# Patient Record
Sex: Female | Born: 1985 | Race: White | Hispanic: No | Marital: Single | Smoking: Current every day smoker
Health system: Southern US, Community
[De-identification: ages and names within clinical notes are randomized; demographics above are authoritative.]

## PROBLEM LIST (undated history)

## (undated) DIAGNOSIS — D069 Carcinoma in situ of cervix, unspecified: Secondary | ICD-10-CM

## (undated) DIAGNOSIS — Z915 Personal history of self-harm: Secondary | ICD-10-CM

## (undated) DIAGNOSIS — Z8659 Personal history of other mental and behavioral disorders: Secondary | ICD-10-CM

## (undated) DIAGNOSIS — Z9151 Personal history of suicidal behavior: Secondary | ICD-10-CM

## (undated) DIAGNOSIS — K219 Gastro-esophageal reflux disease without esophagitis: Secondary | ICD-10-CM

## (undated) DIAGNOSIS — R350 Frequency of micturition: Secondary | ICD-10-CM

## (undated) DIAGNOSIS — R519 Headache, unspecified: Secondary | ICD-10-CM

## (undated) DIAGNOSIS — M479 Spondylosis, unspecified: Secondary | ICD-10-CM

## (undated) DIAGNOSIS — F419 Anxiety disorder, unspecified: Secondary | ICD-10-CM

## (undated) DIAGNOSIS — Z87442 Personal history of urinary calculi: Secondary | ICD-10-CM

## (undated) DIAGNOSIS — N390 Urinary tract infection, site not specified: Secondary | ICD-10-CM

## (undated) DIAGNOSIS — F1011 Alcohol abuse, in remission: Secondary | ICD-10-CM

## (undated) DIAGNOSIS — M47816 Spondylosis without myelopathy or radiculopathy, lumbar region: Secondary | ICD-10-CM

---
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
## 2005-02-06 ENCOUNTER — Ambulatory Visit: Payer: Self-pay | Admitting: Family Medicine

## 2005-05-03 ENCOUNTER — Emergency Department (HOSPITAL_COMMUNITY): Admission: EM | Admit: 2005-05-03 | Discharge: 2005-05-03 | Payer: Self-pay | Admitting: Family Medicine

## 2006-04-05 ENCOUNTER — Emergency Department (HOSPITAL_COMMUNITY): Admission: EM | Admit: 2006-04-05 | Discharge: 2006-04-05 | Payer: Self-pay | Admitting: Emergency Medicine

## 2006-10-04 ENCOUNTER — Emergency Department (HOSPITAL_COMMUNITY): Admission: AD | Admit: 2006-10-04 | Discharge: 2006-10-04 | Payer: Self-pay | Admitting: Family Medicine

## 2006-10-06 ENCOUNTER — Encounter (INDEPENDENT_AMBULATORY_CARE_PROVIDER_SITE_OTHER): Payer: Self-pay | Admitting: Internal Medicine

## 2006-10-06 ENCOUNTER — Ambulatory Visit: Payer: Self-pay | Admitting: Family Medicine

## 2006-10-07 ENCOUNTER — Ambulatory Visit: Payer: Self-pay | Admitting: Family Medicine

## 2006-10-12 ENCOUNTER — Encounter (INDEPENDENT_AMBULATORY_CARE_PROVIDER_SITE_OTHER): Payer: Self-pay | Admitting: Internal Medicine

## 2006-10-19 ENCOUNTER — Other Ambulatory Visit: Admission: RE | Admit: 2006-10-19 | Discharge: 2006-10-19 | Payer: Self-pay | Admitting: Gynecology

## 2007-07-20 ENCOUNTER — Emergency Department (HOSPITAL_COMMUNITY): Admission: EM | Admit: 2007-07-20 | Discharge: 2007-07-20 | Payer: Self-pay | Admitting: Emergency Medicine

## 2007-08-26 ENCOUNTER — Emergency Department (HOSPITAL_COMMUNITY): Admission: EM | Admit: 2007-08-26 | Discharge: 2007-08-26 | Payer: Self-pay | Admitting: Emergency Medicine

## 2007-09-07 ENCOUNTER — Emergency Department (HOSPITAL_COMMUNITY): Admission: EM | Admit: 2007-09-07 | Discharge: 2007-09-07 | Payer: Self-pay | Admitting: Emergency Medicine

## 2008-03-21 ENCOUNTER — Ambulatory Visit: Payer: Self-pay | Admitting: Family Medicine

## 2008-03-21 ENCOUNTER — Encounter (INDEPENDENT_AMBULATORY_CARE_PROVIDER_SITE_OTHER): Payer: Self-pay | Admitting: Internal Medicine

## 2008-03-21 DIAGNOSIS — Z87442 Personal history of urinary calculi: Secondary | ICD-10-CM | POA: Insufficient documentation

## 2008-03-21 DIAGNOSIS — N390 Urinary tract infection, site not specified: Secondary | ICD-10-CM

## 2008-03-21 LAB — CONVERTED CEMR LAB
Bacteria, UA: 0
Blood in Urine, dipstick: NEGATIVE
Glucose, Urine, Semiquant: NEGATIVE
Ketones, urine, test strip: NEGATIVE
Protein, U semiquant: 30
WBC Urine, dipstick: NEGATIVE

## 2008-03-22 ENCOUNTER — Encounter (INDEPENDENT_AMBULATORY_CARE_PROVIDER_SITE_OTHER): Payer: Self-pay | Admitting: Internal Medicine

## 2008-03-24 ENCOUNTER — Encounter: Payer: Self-pay | Admitting: Internal Medicine

## 2008-03-28 ENCOUNTER — Encounter (INDEPENDENT_AMBULATORY_CARE_PROVIDER_SITE_OTHER): Payer: Self-pay | Admitting: *Deleted

## 2008-05-19 ENCOUNTER — Ambulatory Visit: Payer: Self-pay | Admitting: Family Medicine

## 2008-05-19 DIAGNOSIS — F17200 Nicotine dependence, unspecified, uncomplicated: Secondary | ICD-10-CM

## 2008-06-26 ENCOUNTER — Encounter (INDEPENDENT_AMBULATORY_CARE_PROVIDER_SITE_OTHER): Payer: Self-pay | Admitting: *Deleted

## 2008-08-16 ENCOUNTER — Encounter (INDEPENDENT_AMBULATORY_CARE_PROVIDER_SITE_OTHER): Payer: Self-pay | Admitting: Internal Medicine

## 2008-08-16 DIAGNOSIS — N200 Calculus of kidney: Secondary | ICD-10-CM | POA: Insufficient documentation

## 2008-08-24 ENCOUNTER — Ambulatory Visit: Payer: Self-pay | Admitting: Family Medicine

## 2008-08-24 DIAGNOSIS — F411 Generalized anxiety disorder: Secondary | ICD-10-CM

## 2008-08-24 DIAGNOSIS — D649 Anemia, unspecified: Secondary | ICD-10-CM

## 2008-08-24 DIAGNOSIS — K219 Gastro-esophageal reflux disease without esophagitis: Secondary | ICD-10-CM

## 2008-08-24 DIAGNOSIS — F1021 Alcohol dependence, in remission: Secondary | ICD-10-CM

## 2008-08-29 LAB — CONVERTED CEMR LAB
BUN: 9 mg/dL (ref 6–23)
Calcium: 9.1 mg/dL (ref 8.4–10.5)
Eosinophils Relative: 1.9 % (ref 0.0–5.0)
GFR calc Af Amer: 115 mL/min
Glucose, Bld: 90 mg/dL (ref 70–99)
HCT: 36.6 % (ref 36.0–46.0)
Hemoglobin: 12.5 g/dL (ref 12.0–15.0)
Monocytes Absolute: 0.8 10*3/uL (ref 0.1–1.0)
Monocytes Relative: 12.7 % — ABNORMAL HIGH (ref 3.0–12.0)
Neutro Abs: 2.9 10*3/uL (ref 1.4–7.7)
WBC: 6.5 10*3/uL (ref 4.5–10.5)

## 2008-09-08 ENCOUNTER — Telehealth (INDEPENDENT_AMBULATORY_CARE_PROVIDER_SITE_OTHER): Payer: Self-pay | Admitting: Internal Medicine

## 2008-09-16 ENCOUNTER — Emergency Department (HOSPITAL_COMMUNITY): Admission: EM | Admit: 2008-09-16 | Discharge: 2008-09-16 | Payer: Self-pay | Admitting: Emergency Medicine

## 2008-10-03 ENCOUNTER — Ambulatory Visit: Payer: Self-pay | Admitting: Family Medicine

## 2008-10-30 ENCOUNTER — Encounter (INDEPENDENT_AMBULATORY_CARE_PROVIDER_SITE_OTHER): Payer: Self-pay | Admitting: Internal Medicine

## 2009-01-30 ENCOUNTER — Ambulatory Visit: Payer: Self-pay | Admitting: Family Medicine

## 2009-01-30 DIAGNOSIS — K2970 Gastritis, unspecified, without bleeding: Secondary | ICD-10-CM | POA: Insufficient documentation

## 2009-01-30 DIAGNOSIS — K2990 Gastroduodenitis, unspecified, without bleeding: Secondary | ICD-10-CM

## 2009-01-31 ENCOUNTER — Telehealth (INDEPENDENT_AMBULATORY_CARE_PROVIDER_SITE_OTHER): Payer: Self-pay | Admitting: Internal Medicine

## 2009-02-26 ENCOUNTER — Telehealth: Payer: Self-pay | Admitting: Family Medicine

## 2009-04-05 ENCOUNTER — Ambulatory Visit: Payer: Self-pay | Admitting: Family Medicine

## 2009-04-05 DIAGNOSIS — J069 Acute upper respiratory infection, unspecified: Secondary | ICD-10-CM | POA: Insufficient documentation

## 2009-04-22 ENCOUNTER — Emergency Department (HOSPITAL_COMMUNITY): Admission: EM | Admit: 2009-04-22 | Discharge: 2009-04-22 | Payer: Self-pay | Admitting: Emergency Medicine

## 2010-02-07 ENCOUNTER — Telehealth: Payer: Self-pay | Admitting: Family Medicine

## 2010-04-01 ENCOUNTER — Telehealth: Payer: Self-pay | Admitting: Family Medicine

## 2010-05-15 ENCOUNTER — Emergency Department (HOSPITAL_COMMUNITY): Admission: EM | Admit: 2010-05-15 | Discharge: 2010-05-15 | Payer: Self-pay | Admitting: Emergency Medicine

## 2010-08-16 ENCOUNTER — Telehealth: Payer: Self-pay | Admitting: Family Medicine

## 2010-11-27 ENCOUNTER — Emergency Department (HOSPITAL_COMMUNITY)
Admission: EM | Admit: 2010-11-27 | Discharge: 2010-11-27 | Payer: Self-pay | Source: Home / Self Care | Admitting: Emergency Medicine

## 2010-12-22 HISTORY — PX: WISDOM TOOTH EXTRACTION: SHX21

## 2011-01-21 NOTE — Progress Notes (Signed)
Summary: requests phenergan  Phone Note Call from Patient Call back at [phone]   Caller: Mom Summary of Call: Mother states pt has been vomiting all night and she is asking if phenergan suppositories can be called to [facility].  No fever or diarrhea.  Mother states that whenever pt starts vomiting she goes on and on with it and they always have to get phenergan. Initial call taken by: Lowella Petties CMA,  April 01, 2010 9:19 AM  Follow-up for Phone Call        since I do not know her - please give me a bit more backround abd pain/ fever/ other symptoms? what causes this recurrent vomiting? - ? viral or other  px written on EMR for call in   Follow-up by: Judith Part MD,  April 01, 2010 11:30 AM  Additional Follow-up for Phone Call Additional follow up Details #1::        No other sxs.  Mother states that pt gets a couple of vomiting bouts every year, usually with no other sxs.  She is followed by Dr. Nicholas Lose for her gyn problems and he is aware of the vomiting.    Additional Follow-up for Phone Call Additional follow up Details #2::    Patient's mom notified as instructed by telephone. Medication phoned to [facility] pharmacy as instructed. Lewanda Rife LPN  April 01, 2010 1:13 PM   New/Updated Medications: PROMETHAZINE HCL 25 MG SUPP (PROMETHAZINE HCL) 1 per rectum up to three times a day as needed vomiting Prescriptions: PROMETHAZINE HCL 25 MG SUPP (PROMETHAZINE HCL) 1 per rectum up to three times a day as needed vomiting  #15 x 0   Entered and Authorized by:   Judith Part MD   Signed by:   Lewanda Rife LPN on 45/40/9811   Method used:   Telephoned to ...       [facility]* (retail)       [address]       [place], [place]  [address]       Ph: [phone]       Fax: [phone]   RxID:   [id number]

## 2011-01-21 NOTE — Letter (Signed)
Summary: [place] No Show Letter  [place] [facility]  [address] [place], [place] [address]   Phone: [phone]  Fax: [phone]    06/26/2008 MRN: [id number]     [facility] [place] [address] [place], [place]  [address]      Dear Ms. Andel,     Our records indicate that you missed your scheduled appointment with Billie Bean,FNP on 06/26/08.  Please contact this office to reschedule your appointment as soon as possible.  It is important that you keep your scheduled appointments with your physician, so we can provide you the best care possible.  Please be advised that there may be a charge for "no show" appointments.    Sincerely,      [place] at [facility]

## 2011-01-21 NOTE — Progress Notes (Signed)
Summary: ? flu  Phone Note Call from Patient   Caller: MomDondra Spry  [phone] Summary of Call: Mother states pt started out with sore throat, congestion about 4 days ago.  Now has vomiting and diarrhea.  Her sister's family have been dx'd with the flu and are taking tamiflu.  Pt has been around them.  Please advise[facility]. Initial call taken by: Lowella Petties CMA,  February 07, 2010 11:47 AM  Follow-up for Phone Call        Taking Tamiflu at this point would not be effective. It has to be given sooner. Now needs symptomatic rtrmt of routine support. Take Guaifenesin by [facility] to [facility] or [facility] and getting MUCOUS RELIEF EXPECTORANT (400mg ), take 11/2 tabs by mouth AM and NOON. Drink lots of fluids anytime taking Guaifenesin.  Take Tyl ES 2 tabs three times a day  Gargle if throaat sore, Lozenge in mouth to preclude "reactive" cough. Follow-up by: Shaune Leeks MD,  February 07, 2010 1:15 PM  Additional Follow-up for Phone Call Additional follow up Details #1::        [facility] for mother to call.             Lowella Petties CMA  February 07, 2010 1:50 PM  Advised mother. Additional Follow-up by: Lowella Petties CMA,  February 07, 2010 2:58 PM

## 2011-01-21 NOTE — Progress Notes (Signed)
Summary: refill request for aciphex  Phone Note Refill Request Message from:  Fax from Pharmacy  Refills Requested: Medication #1:  ACIPHEX 20 MG TBEC 1 each morning before other food or fluids   Last Refilled: 12/12/2008 Faxed request from [facility].  Pt has not been seen since 4/10, no appt scheduled.  Script for aciphex was written on 12/19/08, with 6 refills that is out of date.  Initial call taken by: Lowella Petties CMA,  August 16, 2010 4:28 PM  Follow-up for Phone Call        please call patient and have her schedule appointment.  #30 and no rf on rx.  thanks.  Follow-up by: Crawford Givens MD,  August 16, 2010 4:34 PM  Additional Follow-up for Phone Call Additional follow up Details #1::        Work number is incorrect.  No answer at home and no AM.  Delilah Shan CMA (AAMA)  August 16, 2010 4:48 PM     Prescriptions: ACIPHEX 20 MG TBEC (RABEPRAZOLE SODIUM) 1 each morning before other food or fluids  #30 x 0   Entered and Authorized by:   Crawford Givens MD   Signed by:   Crawford Givens MD on 08/16/2010   Method used:   Electronically to        [facility]* (retail)       [address]       [place], [place]  [address]       Ph: [phone]       Fax: [phone]   RxID:   [id number]

## 2011-03-03 LAB — URINALYSIS, ROUTINE W REFLEX MICROSCOPIC
Bilirubin Urine: NEGATIVE
Nitrite: NEGATIVE
Specific Gravity, Urine: 1.019 (ref 1.005–1.030)
pH: 5.5 (ref 5.0–8.0)

## 2011-03-03 LAB — URINE MICROSCOPIC-ADD ON

## 2011-04-01 LAB — URINE MICROSCOPIC-ADD ON

## 2011-04-01 LAB — DIFFERENTIAL
Basophils Absolute: 0 10*3/uL (ref 0.0–0.1)
Basophils Relative: 0 % (ref 0–1)
Eosinophils Relative: 0 % (ref 0–5)
Monocytes Absolute: 1.2 10*3/uL — ABNORMAL HIGH (ref 0.1–1.0)
Monocytes Relative: 8 % (ref 3–12)

## 2011-04-01 LAB — URINALYSIS, ROUTINE W REFLEX MICROSCOPIC
Bilirubin Urine: NEGATIVE
Nitrite: NEGATIVE
pH: 6 (ref 5.0–8.0)

## 2011-04-01 LAB — CBC
HCT: 41.3 % (ref 36.0–46.0)
Platelets: 287 10*3/uL (ref 150–400)
WBC: 15.3 10*3/uL — ABNORMAL HIGH (ref 4.0–10.5)

## 2011-04-01 LAB — COMPREHENSIVE METABOLIC PANEL
ALT: 40 U/L — ABNORMAL HIGH (ref 0–35)
AST: 35 U/L (ref 0–37)
Albumin: 3.9 g/dL (ref 3.5–5.2)
Alkaline Phosphatase: 50 U/L (ref 39–117)
Chloride: 101 mEq/L (ref 96–112)
GFR calc Af Amer: >60 mL/min (ref >60)
Potassium: 3.5 mEq/L (ref 3.5–5.1)
Sodium: 132 mEq/L — ABNORMAL LOW (ref 135–145)
Total Bilirubin: 0.4 mg/dL (ref 0.3–1.2)

## 2011-04-01 LAB — PREGNANCY, URINE: Preg Test, Ur: NEGATIVE
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
## 2011-05-09 NOTE — H&P (Signed)
NAME:  [name], [name]                       ACCOUNT NO.:  [id number]   MEDICAL RECORD NO.:  [id number]                   PATIENT TYPE:  OBV   LOCATION:  9326                                 F[facility]ILITY:  [facility]   PHYSICIAN:  Gretta Cool, M.D.              DATE OF BIRTH:  [birth year]   DATE OF ADMISSION:  07/22/2002  DATE OF DISCHARGE:  07/23/2002                                HISTORY & PHYSICAL   CHIEF COMPLAINT:  Nausea, vomiting, severe abdominal pain and fever.   HISTORY OF PRESENT ILLNESS:  This is a 25 year old, white, single female,  sexually active since the age of 42, previously culture negative for GC and  Chlamydia.  At last visit, she reported that she was not sexually active.  She has had recurring episodes of cyclic pelvic pain.  She has been seen in  the emergency room on a couple of occasions.  On one occasion, she had CT  scan of her abdomen for suspected appendicitis.  She had urologic  consultation also because of finding of CT with cyst on her kidneys.  She  did not have GYN consultation.  She has episodes of pain that spontaneously  resolve.  She has had cyclic pelvic discomfort occurring approximately mid  cycle and as well as menstrual pain.  She now presents with a history of  sudden onset of severe lower abdominal discomfort with onset at  approximately the end of her menses.  Her last menstrual period began  approximately seven days earlier and just ended the night before her pain  began.  She had onset of pain on July 31, associated with nausea, vomiting  and pain when she walked or took a step.  She also reported high fever with  temperature greater than 102.  She was seen in the office with findings  compatible with an acute abdomen with generalized rebound.  She is now  admitted with diagnosis of probable acute gonococcal PID versus recurrent  pelvic inflammatory disease.  The decision was made to admit her because of  her high fever, elevated white  count and peritoneal signs that are severe  with nausea and vomiting unable to hold down medications as an outpatient.   PAST MEDICAL HISTORY:  1. Usual childhood diseases.  2. No accidents or injuries.   ALLERGIES:  No known drug allergies.   MEDICATIONS:  Nordette oral contraception.   FAMILY HISTORY:  Mother and father in good health.  One sister and one  brother also in good health.  Mother has hypertension.   HABITS:  She smokes.  Denies recreational drugs and alcohol.   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  Mother apparently was not aware that she had been sexually active  in the past.   REVIEW OF SYMPTOMS:  HEENT:  Denies symptoms.  RESPIRATORY:  Denies  shortness of breath.  GI:  Denies frequency, urgency, dysuria, change in  bowel habits.   PHYSICAL EXAMINATION:  GENERAL:  Well-developed, very acutely ill white  female.  When she walks, she is stooped to prevent jarring of her lower  abdomen. She was nauseated during examination with vomiting.  HEENT:  Pupils equal round and reactive to light and accommodation.  Fundi  not examined.  Oropharynx clear.  NECK:  Supple without mass or thyroid enlargement.  CHEST:  Clear to P&A.  HEART:  Regular rhythm without murmur or cardiac enlargement.  BREASTS:  Without mass or nodes.  ABDOMEN:  Direct and rebound tenderness with board-like abdomen and extreme  rebound in all quadrants.  Her abdomen is relatively silent.  PELVIC:  External genitalia, normal female.  Introitus with exquisite  tenderness to cervical motion.  Unable to adequately evaluate her pelvic  organs because of the exquisite tenderness, but there seems to be fullness  bilaterally.  Rectovaginal exam confirms.  EXTREMITIES:  Negative.  NEUROLOGIC:  Physiologic.   IMPRESSION:  1. Probable acute gonococcal pelvic inflammatory disease, rule out chronic     and recurrent pelvic inflammatory disease.  2. History of  recurring lower abdominal pain as above.  3. Early onset of sexual activity without the use of condom to prevent     disease.   PLAN:  1. Admit to [facility] [place]     antibiotic therapy pending culture results.  2. Treat for acute gonococcal pelvic inflammatory disease and Chlamydia as     well as for the possibility this may represent a chronic recurring pelvic     inflammatory process with secondary anaerobic infection.                                               Gretta Cool, M.D.    CWL/MEDQ  D:  07/24/2002  T:  07/29/2002  Job:  [id number]   cc:   Carlean Purl, M.D.

## 2011-07-28 ENCOUNTER — Other Ambulatory Visit: Payer: Self-pay | Admitting: Orthopedic Surgery

## 2011-07-28 DIAGNOSIS — M545 Low back pain: Secondary | ICD-10-CM

## 2011-08-01 ENCOUNTER — Ambulatory Visit
Admission: RE | Admit: 2011-08-01 | Discharge: 2011-08-01 | Disposition: A | Discharge status: Home / Self Care | Payer: 59 | Source: Ambulatory Visit | Attending: Orthopedic Surgery | Admitting: Orthopedic Surgery

## 2011-08-01 DIAGNOSIS — M545 Low back pain: Secondary | ICD-10-CM

## 2011-09-22 LAB — DIFFERENTIAL
Basophils Absolute: 0
Basophils Relative: 0
Eosinophils Relative: 0
Lymphocytes Relative: 24
Neutro Abs: 9.8 — ABNORMAL HIGH

## 2011-09-22 LAB — POCT I-STAT, CHEM 8
Calcium, Ion: 1.13
Chloride: 107
Glucose, Bld: 97
HCT: 43
Hemoglobin: 14.6
TCO2: 21

## 2011-09-22 LAB — POCT URINALYSIS DIP (DEVICE)
Bilirubin Urine: NEGATIVE
Nitrite: NEGATIVE
Urobilinogen, UA: 0.2
pH: 6

## 2011-09-22 LAB — CBC
MCHC: 33.7
Platelets: 283
RDW: 13.5

## 2011-09-22 LAB — POCT PREGNANCY, URINE: Preg Test, Ur: NEGATIVE

## 2012-01-23 HISTORY — PX: CERVICAL BIOPSY  W/ LOOP ELECTRODE EXCISION: SUR135

## 2012-02-20 ENCOUNTER — Encounter (HOSPITAL_COMMUNITY): Payer: Self-pay | Admitting: *Deleted

## 2012-02-20 ENCOUNTER — Emergency Department (HOSPITAL_COMMUNITY)
Admission: EM | Admit: 2012-02-20 | Discharge: 2012-02-21 | Discharge status: Against Medical Advice | Payer: No Typology Code available for payment source | Attending: Emergency Medicine | Admitting: Emergency Medicine

## 2012-02-20 DIAGNOSIS — Z043 Encounter for examination and observation following other accident: Secondary | ICD-10-CM | POA: Insufficient documentation

## 2012-02-20 HISTORY — DX: Spondylosis without myelopathy or radiculopathy, lumbar region: M47.816
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
## 2012-02-20 NOTE — ED Notes (Signed)
Called x1. No answer.

## 2012-02-21 ENCOUNTER — Emergency Department (HOSPITAL_COMMUNITY): Payer: No Typology Code available for payment source

## 2012-02-21 ENCOUNTER — Emergency Department (INDEPENDENT_AMBULATORY_CARE_PROVIDER_SITE_OTHER): Payer: No Typology Code available for payment source

## 2012-02-21 ENCOUNTER — Encounter (HOSPITAL_COMMUNITY): Payer: Self-pay | Admitting: Emergency Medicine

## 2012-02-21 ENCOUNTER — Emergency Department (INDEPENDENT_AMBULATORY_CARE_PROVIDER_SITE_OTHER)
Admission: EM | Admit: 2012-02-21 | Discharge: 2012-02-21 | Disposition: A | Discharge status: Home / Self Care | Payer: Self-pay | Source: Home / Self Care | Attending: Emergency Medicine | Admitting: Emergency Medicine

## 2012-02-21 DIAGNOSIS — S335XXA Sprain of ligaments of lumbar spine, initial encounter: Secondary | ICD-10-CM

## 2012-02-21 DIAGNOSIS — S39012A Strain of muscle, fascia and tendon of lower back, initial encounter: Secondary | ICD-10-CM

## 2012-02-21 DIAGNOSIS — S63509A Unspecified sprain of unspecified wrist, initial encounter: Secondary | ICD-10-CM

## 2012-02-21 LAB — POCT PREGNANCY, URINE: Preg Test, Ur: NEGATIVE

## 2012-02-21 MED ORDER — DICLOFENAC SODIUM 75 MG PO TBEC: 75.0000 mg | DELAYED_RELEASE_TABLET | Freq: Two times a day (BID) | ORAL | Status: DC

## 2012-02-21 MED ORDER — TRAMADOL HCL 50 MG PO TABS: 100.0000 mg | ORAL_TABLET | Freq: Three times a day (TID) | ORAL | Status: DC | PRN

## 2012-02-21 MED ORDER — METHOCARBAMOL 500 MG PO TABS: 500.0000 mg | ORAL_TABLET | Freq: Three times a day (TID) | ORAL | Status: AC

## 2012-02-21 MED ORDER — ACETAMINOPHEN-CODEINE #3 300-30 MG PO TABS: 1.0000 | ORAL_TABLET | ORAL | Status: AC | PRN

## 2012-02-21 MED ORDER — METHOCARBAMOL 500 MG PO TABS: 500.0000 mg | ORAL_TABLET | Freq: Three times a day (TID) | ORAL | Status: DC
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
## 2012-02-21 NOTE — Discharge Instructions (Signed)
Back Exercises Back exercises help treat and prevent back injuries. The goal of back exercises is to increase the strength of your abdominal and back muscles and the flexibility of your back. These exercises should be started when you no longer have back pain. Back exercises include:  Pelvic Tilt. Lie on your back with your knees bent. Tilt your pelvis until the lower part of your back is against the floor. Hold this position 5 to 10 sec and repeat 5 to 10 times.   Knee to Chest. Pull first 1 knee up against your chest and hold for 20 to 30 seconds, repeat this with the other knee, and then both knees. This may be done with the other leg straight or bent, whichever feels better.   Sit-Ups or Curl-Ups. Bend your knees 90 degrees. Start with tilting your pelvis, and do a partial, slow sit-up, lifting your trunk only 30 to 45 degrees off the floor. Take at least 2 to 3 seconds for each sit-up. Do not do sit-ups with your knees out straight. If partial sit-ups are difficult, simply do the above but with only tightening your abdominal muscles and holding it as directed.   Hip-Lift. Lie on your back with your knees flexed 90 degrees. Push down with your feet and shoulders as you raise your hips a couple inches off the floor; hold for 10 seconds, repeat 5 to 10 times.   Back arches. Lie on your stomach, propping yourself up on bent elbows. Slowly press on your hands, causing an arch in your low back. Repeat 3 to 5 times. Any initial stiffness and discomfort should lessen with repetition over time.   Shoulder-Lifts. Lie face down with arms beside your body. Keep hips and torso pressed to floor as you slowly lift your head and shoulders off the floor.  Do not overdo your exercises, especially in the beginning. Exercises may cause you some mild back discomfort which lasts for a few minutes; however, if the pain is more severe, or lasts for more than 15 minutes, do not continue exercises until you see your  caregiver. Improvement with exercise therapy for back problems is slow.  See your caregivers for assistance with developing a proper back exercise program. Document Released: 01/15/2005 Document Revised: 08/06/2011 Document Reviewed: 12/08/2005 [employer] Patient Information 2012 [employer]. 

## 2012-03-13 ENCOUNTER — Encounter (HOSPITAL_COMMUNITY): Payer: Self-pay | Admitting: Emergency Medicine

## 2012-03-13 ENCOUNTER — Emergency Department (INDEPENDENT_AMBULATORY_CARE_PROVIDER_SITE_OTHER)
Admission: EM | Admit: 2012-03-13 | Discharge: 2012-03-13 | Disposition: A | Discharge status: Home / Self Care | Payer: 59 | Source: Home / Self Care | Attending: Emergency Medicine | Admitting: Emergency Medicine

## 2012-03-13 DIAGNOSIS — N200 Calculus of kidney: Secondary | ICD-10-CM

## 2012-03-13 DIAGNOSIS — S63509A Unspecified sprain of unspecified wrist, initial encounter: Secondary | ICD-10-CM

## 2012-03-13 DIAGNOSIS — R319 Hematuria, unspecified: Secondary | ICD-10-CM

## 2012-03-13 DIAGNOSIS — S335XXA Sprain of ligaments of lumbar spine, initial encounter: Secondary | ICD-10-CM

## 2012-03-13 LAB — POCT PREGNANCY, URINE: Preg Test, Ur: NEGATIVE

## 2012-03-13 LAB — POCT URINALYSIS DIP (DEVICE)
Bilirubin Urine: NEGATIVE
Glucose, UA: NEGATIVE mg/dL
Ketones, ur: NEGATIVE mg/dL
Specific Gravity, Urine: 1.015 (ref 1.005–1.030)
Urobilinogen, UA: 0.2 mg/dL (ref 0.0–1.0)

## 2012-03-13 LAB — URINALYSIS, ROUTINE W REFLEX MICROSCOPIC
Glucose, UA: NEGATIVE mg/dL
Protein, ur: 30 mg/dL — AB

## 2012-03-13 LAB — URINE MICROSCOPIC-ADD ON

## 2012-03-13 MED ORDER — ONDANSETRON HCL 4 MG PO TABS: 4.0000 mg | ORAL_TABLET | Freq: Four times a day (QID) | ORAL | Status: AC

## 2012-03-13 MED ORDER — CIPROFLOXACIN HCL 500 MG PO TABS: 500.0000 mg | ORAL_TABLET | Freq: Two times a day (BID) | ORAL | Status: AC

## 2012-03-13 MED ORDER — OXYCODONE-ACETAMINOPHEN 5-325 MG PO TABS: 2.0000 | ORAL_TABLET | ORAL | Status: AC | PRN

## 2012-03-13 MED ORDER — TAMSULOSIN HCL 0.4 MG PO CAPS: 0.4000 mg | ORAL_CAPSULE | Freq: Every day | ORAL | Status: DC

## 2012-03-13 MED ORDER — ONDANSETRON HCL 4 MG PO TABS: 4.0000 mg | ORAL_TABLET | Freq: Four times a day (QID) | ORAL | Status: DC
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
## 2012-03-13 NOTE — Discharge Instructions (Signed)
Give [place] a working phone number so we can contact you if needed. We're sending your urine off for culture, and make sure that you're on the right antibiotic. Increase your fluid intake. You would need to followup with Dr. Laverle Patter, or with Dr. Hillis Range, the urologist on call for reevaluation. Strain all of your urine. Take the medication as written. Return to the ER for worsening symptoms, fever above 100.4, if you're unable to keep any fluids down, or other concerns.

## 2012-03-13 NOTE — ED Provider Notes (Signed)
History     CSN: [id number]  Arrival date & time 03/13/12  1406   First MD Initiated Contact with Patient 03/13/12 1436      Chief Complaint  Patient presents with  . Hematuria  . Flank Pain    (Consider location/radiation/quality/duration/timing/severity/associated sxs/prior treatment) HPI Comments: Patient reports waxing and waning bilateral lower back pain radiating to her groin for the past several days. Reports gross hematuria starting yesterday. Has nausea, no vomiting, no fevers, urinary urgency, frequency, dysuria, cloudy or oderous urine. No back swelling, abdominal distention. No alleviating, aggravating factors. Patient has tried Tylenol #3 without relief. Patient is status post LEEP procedure 2 weeks ago, states vaginal bleeding has since resolved. Patient has a history of recurrent nonobstructing bilateral kidney stones, and states that the pain today is similar to that. Patient has gone to [facility] in the past. Last episode kidney stones in 2009.   Patient is a 26 y.o. female presenting with hematuria. The history is provided by the patient.  Hematuria This is a recurrent problem. The current episode started in the past 7 days. The problem is unchanged. She describes the hematuria as gross hematuria. She reports no clotting in her urine stream. The pain is moderate. She describes her urine color as dark red. Irritative symptoms do not include frequency or urgency. Obstructive symptoms do not include incomplete emptying or straining. Associated symptoms include abdominal pain, flank pain and nausea. Pertinent negatives include no chills, dysuria, facial swelling, fever, genital pain, hesitancy, inability to urinate or vomiting. She is sexually active. Her past medical history is significant for kidney stones and tobacco use.    Past Medical History  Diagnosis Date  . Endometriosis   . Kidney stones   . Kidney infection   . Lumbar facet arthropathy   . Kidney stones     . Cancer     Past Surgical History  Procedure Date  . Cervical cancer     with surgeries  . Hand surgery   . Hand surgery     right hand  . Dental surgery   . Cervical biopsy  w/ loop electrode excision     2 WEEKS AGO    Family History  Problem Relation Age of Onset  . Cancer Mother   . Heart failure Father     History  Substance Use Topics  . Smoking status: Current Everyday Smoker -- 0.5 packs/day  . Smokeless tobacco: Not on file  . Alcohol Use: No     socially    OB History    Grav Para Term Preterm Abortions TAB SAB Ect Mult Living                  Review of Systems  Constitutional: Negative for fever and chills.  HENT: Negative for facial swelling.   Gastrointestinal: Positive for nausea and abdominal pain. Negative for vomiting.  Genitourinary: Positive for hematuria and flank pain. Negative for dysuria, hesitancy, urgency, frequency and incomplete emptying.    Allergies  Review of patient's allergies indicates no known allergies.  Home Medications   Current Outpatient Rx  Name Route Sig Dispense Refill  . CIPROFLOXACIN HCL 500 MG PO TABS Oral Take 1 tablet (500 mg total) by mouth 2 (two) times daily. 14 tablet 0  . ONDANSETRON HCL 4 MG PO TABS Oral Take 1 tablet (4 mg total) by mouth every 6 (six) hours. 20 tablet 0  . OXYCODONE-ACETAMINOPHEN 5-325 MG PO TABS Oral Take 2 tablets by mouth every  4 (four) hours as needed for pain. 15 tablet 0  . PRENATAL PLUS 27-1 MG PO TABS Oral Take 1 tablet by mouth daily.    Marland Kitchen TAMSULOSIN HCL 0.4 MG PO CAPS Oral Take 1 capsule (0.4 mg total) by mouth at bedtime. 14 capsule 0    BP 108/65  Pulse 78  Temp(Src) 98.5 F (36.9 C) (Oral)  Resp 18  SpO2 100%  LMP 03/07/2012  Physical Exam  Nursing note and vitals reviewed. Constitutional: She is oriented to person, place, and time. She appears well-developed and well-nourished. No distress.  HENT:  Head: Normocephalic and atraumatic.  Eyes: Conjunctivae and EOM  are normal.  Neck: Normal range of motion.  Cardiovascular: Normal rate, regular rhythm and normal heart sounds.   Pulmonary/Chest: Effort normal and breath sounds normal.  Abdominal: Soft. Bowel sounds are normal. She exhibits no distension. There is tenderness in the suprapubic area. There is CVA tenderness. There is no rigidity, no rebound and no guarding.       Bilateral flank tenderness, right more than left. Bilateral flank tenderness, tenderness at left UV junction. Mild suprapubic tenderness.  Musculoskeletal: Normal range of motion.  Neurological: She is alert and oriented to person, place, and time.  Skin: Skin is warm and dry.  Psychiatric: She has a normal mood and affect. Her behavior is normal. Judgment and thought content normal.    ED Course  Procedures (including critical care time)  Labs Reviewed  POCT URINALYSIS DIP (DEVICE) - Abnormal; Notable for the following:    Hgb urine dipstick LARGE (*)    Protein, ur 100 (*)    All other components within normal limits  URINALYSIS, ROUTINE W REFLEX MICROSCOPIC - Abnormal; Notable for the following:    Color, Urine RED (*) BIOCHEMICALS MAY BE AFFECTED BY COLOR   APPearance CLOUDY (*)    Hgb urine dipstick LARGE (*)    Protein, ur 30 (*)    Leukocytes, UA SMALL (*)    All other components within normal limits  URINE MICROSCOPIC-ADD ON - Abnormal; Notable for the following:    Squamous Epithelial / LPF FEW (*)    All other components within normal limits  POCT PREGNANCY, URINE  URINE CULTURE   No results found.  1. Hematuria 2. Nephrolithiasis   MDM  Previous records, imaging, labs reviewed. Patient has had several CT abdomen for kidney stones, most recent one in 2009. Showed bilateral small nonobstructing stones. Today patient has tenderness at left UVJ, bilateral CVA tenderness right more than left. No back swelling, facial or lower extremity swelling. Mild bilateral flank tenderness. Do not think that hematuria is  from vaginal bleeding.  Patient is afebrile, appears comfortable, tolerating by mouth, appears well-hydrated.  As patient has had no history of obstructing stones, will treat this as if this is nephrolithiasis and have her followup with her urologist. Sending urine off for microscopy and culture. Sending her home on Cipro, Flomax, Zofran, Percocet. Told her to give [place] a working phone number, so that we can contact her in if we need to change her antibiotics. Patient states she is unable to take NSAIDs and she is status post LEEP, and was instructed by her generated to not take any NSAIDs for 6 weeks.  Luiz Blare, MD 03/13/12 [phone]

## 2012-03-14 LAB — URINE CULTURE
Colony Count: 1000
Culture  Setup Time: 201303240151

## 2012-03-30 ENCOUNTER — Emergency Department (HOSPITAL_COMMUNITY): Payer: 59

## 2012-03-30 ENCOUNTER — Emergency Department (HOSPITAL_COMMUNITY)
Admission: EM | Admit: 2012-03-30 | Discharge: 2012-03-30 | Disposition: A | Discharge status: Home / Self Care | Payer: 59 | Attending: Emergency Medicine | Admitting: Emergency Medicine

## 2012-03-30 ENCOUNTER — Encounter (HOSPITAL_COMMUNITY): Payer: Self-pay | Admitting: Emergency Medicine

## 2012-03-30 DIAGNOSIS — R61 Generalized hyperhidrosis: Secondary | ICD-10-CM | POA: Insufficient documentation

## 2012-03-30 DIAGNOSIS — N200 Calculus of kidney: Secondary | ICD-10-CM

## 2012-03-30 DIAGNOSIS — M549 Dorsalgia, unspecified: Secondary | ICD-10-CM | POA: Insufficient documentation

## 2012-03-30 DIAGNOSIS — R112 Nausea with vomiting, unspecified: Secondary | ICD-10-CM | POA: Insufficient documentation

## 2012-03-30 DIAGNOSIS — R109 Unspecified abdominal pain: Secondary | ICD-10-CM | POA: Insufficient documentation

## 2012-03-30 DIAGNOSIS — N1330 Unspecified hydronephrosis: Secondary | ICD-10-CM | POA: Insufficient documentation

## 2012-03-30 DIAGNOSIS — N201 Calculus of ureter: Secondary | ICD-10-CM | POA: Insufficient documentation

## 2012-03-30 LAB — URINALYSIS, ROUTINE W REFLEX MICROSCOPIC
Ketones, ur: NEGATIVE mg/dL
Leukocytes, UA: NEGATIVE
Nitrite: NEGATIVE
Protein, ur: NEGATIVE mg/dL
Urobilinogen, UA: 0.2 mg/dL (ref 0.0–1.0)
pH: 6.5 (ref 5.0–8.0)

## 2012-03-30 LAB — CBC
HCT: 36.7 % (ref 36.0–46.0)
Hemoglobin: 12.1 g/dL (ref 12.0–15.0)
MCH: 32 pg (ref 26.0–34.0)
MCHC: 33 g/dL (ref 30.0–36.0)
MCV: 97.1 fL (ref 78.0–100.0)
RDW: 13.3 % (ref 11.5–15.5)

## 2012-03-30 LAB — POCT I-STAT, CHEM 8
BUN: 11 mg/dL (ref 6–23)
Chloride: 106 mEq/L (ref 96–112)
Creatinine, Ser: 0.7 mg/dL (ref 0.50–1.10)
Potassium: 3.3 mEq/L — ABNORMAL LOW (ref 3.5–5.1)
Sodium: 142 mEq/L (ref 135–145)
TCO2: 24 mmol/L (ref 0–100)

## 2012-03-30 LAB — DIFFERENTIAL
Basophils Absolute: 0.1 10*3/uL (ref 0.0–0.1)
Basophils Relative: 1 % (ref 0–1)
Eosinophils Absolute: 0.3 10*3/uL (ref 0.0–0.7)
Eosinophils Relative: 2 % (ref 0–5)
Monocytes Absolute: 0.9 10*3/uL (ref 0.1–1.0)
Monocytes Relative: 7 % (ref 3–12)

## 2012-03-30 LAB — URINE MICROSCOPIC-ADD ON

## 2012-03-30 MED ORDER — HYDROCODONE-ACETAMINOPHEN 5-500 MG PO TABS: 1.0000 | ORAL_TABLET | Freq: Four times a day (QID) | ORAL | Status: DC | PRN

## 2012-03-30 MED ORDER — KETOROLAC TROMETHAMINE 30 MG/ML IJ SOLN
30.0000 mg | Freq: Once | INTRAMUSCULAR | Status: AC
  Administered 2012-03-30: 30 mg via INTRAVENOUS
  Filled 2012-03-30: qty 1

## 2012-03-30 MED ORDER — ONDANSETRON 8 MG PO TBDP: 8.0000 mg | ORAL_TABLET | Freq: Three times a day (TID) | ORAL | Status: DC | PRN

## 2012-03-30 MED ORDER — CIPROFLOXACIN HCL 500 MG PO TABS: 500.0000 mg | ORAL_TABLET | Freq: Two times a day (BID) | ORAL | Status: DC

## 2012-03-30 MED ORDER — HYDROMORPHONE HCL PF 1 MG/ML IJ SOLN
1.0000 mg | Freq: Once | INTRAMUSCULAR | Status: AC
  Administered 2012-03-30: 1 mg via INTRAVENOUS
  Filled 2012-03-30: qty 1

## 2012-03-30 MED ORDER — HYDROCODONE-ACETAMINOPHEN 5-325 MG PO TABS
1.0000 | ORAL_TABLET | Freq: Once | ORAL | Status: AC
  Administered 2012-03-30: 1 via ORAL
  Filled 2012-03-30: qty 1

## 2012-03-30 MED ORDER — ONDANSETRON HCL 4 MG/2ML IJ SOLN
4.0000 mg | Freq: Once | INTRAMUSCULAR | Status: AC
  Administered 2012-03-30: 4 mg via INTRAVENOUS
  Filled 2012-03-30: qty 2

## 2012-03-30 MED ORDER — IBUPROFEN 600 MG PO TABS: 600.0000 mg | ORAL_TABLET | Freq: Four times a day (QID) | ORAL | Status: AC | PRN

## 2012-03-30 MED ORDER — OXYCODONE-ACETAMINOPHEN 5-325 MG PO TABS
1.0000 | ORAL_TABLET | Freq: Once | ORAL | Status: DC
  Filled 2012-03-30: qty 1

## 2012-03-30 NOTE — ED Notes (Signed)
UA sent to lab

## 2012-03-30 NOTE — ED Notes (Signed)
Pt is c/o right flank pain that started this morning  Pt has hx of kidney stones

## 2012-03-30 NOTE — Discharge Instructions (Signed)
You have a 4mm kidney stone in right ureter that is causing your pain. Continue ibuprofen, vicodin at home for pain. Take zofran for nausea as prescribed. Take cipro for possible infection as prescribed. Follow up with urology as scheduled tomorrow.  Return if worsening.   Kidney Stones Kidney stones (ureteral lithiasis) are deposits that form inside your kidneys. The intense pain is caused by the stone moving through the urinary tract. When the stone moves, the ureter goes into spasm around the stone. The stone is usually passed in the urine.  CAUSES   A disorder that makes certain neck glands produce too much parathyroid hormone (primary hyperparathyroidism).   A buildup of uric acid crystals.   Narrowing (stricture) of the ureter.   A kidney obstruction present at birth (congenital obstruction).   Previous surgery on the kidney or ureters.   Numerous kidney infections.  SYMPTOMS   Feeling sick to your stomach (nauseous).   Throwing up (vomiting).   Blood in the urine (hematuria).   Pain that usually spreads (radiates) to the groin.   Frequency or urgency of urination.  DIAGNOSIS   Taking a history and physical exam.   Blood or urine tests.   Computerized X-ray scan (CT scan).   Occasionally, an examination of the inside of the urinary bladder (cystoscopy) is performed.  TREATMENT   Observation.   Increasing your fluid intake.   Surgery may be needed if you have severe pain or persistent obstruction.  The size, location, and chemical composition are all important variables that will determine the proper choice of action for you. Talk to your caregiver to better understand your situation so that you will minimize the risk of injury to yourself and your kidney.  HOME CARE INSTRUCTIONS   Drink enough water and fluids to keep your urine clear or pale yellow.   Strain all urine through the provided strainer. Keep all particulate matter and stones for your caregiver to  see. The stone causing the pain may be as small as a grain of salt. It is very important to use the strainer each and every time you pass your urine. The collection of your stone will allow your caregiver to analyze it and verify that a stone has actually passed.   Only take over-the-counter or prescription medicines for pain, discomfort, or fever as directed by your caregiver.   Make a follow-up appointment with your caregiver as directed.   Get follow-up X-rays if required. The absence of pain does not always mean that the stone has passed. It may have only stopped moving. If the urine remains completely obstructed, it can cause loss of kidney function or even complete destruction of the kidney. It is your responsibility to make sure X-rays and follow-ups are completed. Ultrasounds of the kidney can show blockages and the status of the kidney. Ultrasounds are not associated with any radiation and can be performed easily in a matter of minutes.  SEEK IMMEDIATE MEDICAL CARE IF:   Pain cannot be controlled with the prescribed medicine.   You have a fever.   The severity or intensity of pain increases over 18 hours and is not relieved by pain medicine.   You develop a new onset of abdominal pain.   You feel faint or pass out.  MAKE SURE YOU:   Understand these instructions.   Will watch your condition.   Will get help right away if you are not doing well or get worse.  Document Released: 12/08/2005 Document Revised: 11/27/2011  Document Reviewed: 04/05/2010 [facility] Patient Information 2012 [place], [place].

## 2012-03-30 NOTE — ED Notes (Signed)
Pt. requested for something to drink. RN Lillia Abed approved and gave gingerale.

## 2012-03-30 NOTE — ED Notes (Signed)
Pt's family at desk to inquire about seeing MD; RN notified Dr. Norlene Campbell of request.

## 2012-03-30 NOTE — ED Provider Notes (Signed)
Medical screening examination/treatment/procedure(s) were performed by non-physician practitioner and as supervising physician I was immediately available for consultation/collaboration.  Olivia Mackie, MD 03/30/12 2136

## 2012-03-30 NOTE — ED Provider Notes (Signed)
History     CSN: [id number]  Arrival date & time 03/30/12  5621   First MD Initiated Contact with Patient 03/30/12 [phone]      Chief Complaint  Patient presents with  . Flank Pain    (Consider location/radiation/quality/duration/timing/severity/associated sxs/prior treatment) Patient is a 26 y.o. female presenting with flank pain. The history is provided by the patient.  Flank Pain This is a new problem. The problem occurs constantly. Associated symptoms include abdominal pain, nausea and vomiting. Pertinent negatives include no chills or fever.  Pt states she has had right flank pain for 2 days now. States hx of kindey stones. Denies dysuria, denies hematuria. States pain radiates into right lower abdomen, admits to nausea, vomiting. Denies fever chills. Has appointment with urologist for hematuria that she had few weeks ago. Denies taking any medications prior to arrival.  Past Medical History  Diagnosis Date  . Endometriosis   . Kidney stones   . Kidney infection   . Lumbar facet arthropathy   . Kidney stones   . Cancer     Past Surgical History  Procedure Date  . Cervical cancer     with surgeries  . Hand surgery   . Hand surgery     right hand  . Dental surgery   . Cervical biopsy  w/ loop electrode excision     2 WEEKS AGO    Family History  Problem Relation Age of Onset  . Cancer Mother   . Heart failure Father     History  Substance Use Topics  . Smoking status: Current Everyday Smoker -- 0.5 packs/day    Types: Cigarettes  . Smokeless tobacco: Not on file  . Alcohol Use: Yes     socially    OB History    Grav Para Term Preterm Abortions TAB SAB Ect Mult Living                  Review of Systems  Constitutional: Negative for fever and chills.  Gastrointestinal: Positive for nausea, vomiting and abdominal pain. Negative for diarrhea.  Genitourinary: Positive for flank pain. Negative for dysuria, frequency and hematuria.  Musculoskeletal: Positive  for back pain.  All other systems reviewed and are negative.    Allergies  Review of patient's allergies indicates no known allergies.  Home Medications   Current Outpatient Rx  Name Route Sig Dispense Refill  . ACETAMINOPHEN-CODEINE #3 300-30 MG PO TABS Oral Take 1 tablet by mouth every 4 (four) hours as needed. Pain    . ALPRAZOLAM 0.5 MG PO TABS Oral Take 0.5 mg by mouth at bedtime as needed. Anxiety    . PRENATAL PLUS 27-1 MG PO TABS Oral Take 1 tablet by mouth daily.    Marland Kitchen TAMSULOSIN HCL 0.4 MG PO CAPS Oral Take 1 capsule (0.4 mg total) by mouth at bedtime. 14 capsule 0    BP 112/64  Pulse 66  Temp(Src) 97.5 F (36.4 C) (Oral)  Resp 18  Ht 5\' 1"  (1.549 m)  Wt 96 lb (43.545 kg)  BMI 18.14 kg/m2  SpO2 100%  LMP 03/26/2012  Physical Exam  Nursing note and vitals reviewed. Constitutional: She is oriented to person, place, and time. She appears well-developed and well-nourished.       Pt is diaphoretic, appears in pain, crying  HENT:  Head: Normocephalic.  Eyes: Conjunctivae are normal.  Neck: Neck supple.  Cardiovascular: Normal rate, regular rhythm and normal heart sounds.   Pulmonary/Chest: Effort normal and breath sounds  normal. No respiratory distress.  Abdominal: Soft. Bowel sounds are normal.       Right CVA tenderness. RLQ tenderness, no guarding, no rebound tenderness.  Musculoskeletal: Normal range of motion.  Neurological: She is alert and oriented to person, place, and time.  Skin: Skin is warm and dry.  Psychiatric: She has a normal mood and affect.    ED Course  Procedures (including critical care time)  Labs Reviewed  URINALYSIS, ROUTINE W REFLEX MICROSCOPIC - Abnormal; Notable for the following:    APPearance CLOUDY (*)    Hgb urine dipstick LARGE (*)    All other components within normal limits  URINE MICROSCOPIC-ADD ON - Abnormal; Notable for the following:    Squamous Epithelial / LPF FEW (*)    Bacteria, UA MANY (*)    All other components  within normal limits  CBC - Abnormal; Notable for the following:    WBC 14.1 (*)    RBC 3.78 (*)    All other components within normal limits  DIFFERENTIAL - Abnormal; Notable for the following:    Neutro Abs 8.7 (*)    All other components within normal limits  POCT I-STAT, CHEM 8 - Abnormal; Notable for the following:    Potassium 3.3 (*)    Glucose, Bld 126 (*)    All other components within normal limits  POCT PREGNANCY, URINE  URINE CULTURE   Ct Abdomen Pelvis Wo Contrast  03/30/2012  *RADIOLOGY REPORT*  Clinical Data: Right flank pain; history of endometriosis and renal stones.  Red blood cells and white blood cells in the urine.  CT ABDOMEN AND PELVIS WITHOUT CONTRAST  Technique:  Multidetector CT imaging of the abdomen and pelvis was performed following the standard protocol without intravenous contrast.  Comparison: CT urogram performed 03/22/2008  Findings: The visualized lung bases are clear.  The liver and spleen are unremarkable in appearance.  The gallbladder is within normal limits.  The pancreas and adrenal glands are unremarkable.  There is mild right-sided hydronephrosis, with prominence of the proximal ureter to the level of an obstructing 4 mm stone, approximately 3 cm below the right renal pelvis.  This is difficult to characterize due to developmentally unusual configuration of the right kidney.  Scattered small nonobstructing bilateral renal stones are seen. The left kidney is otherwise unremarkable.  No free fluid is identified.  The small bowel is unremarkable in appearance.  The stomach is within normal limits.  No acute vascular abnormalities are seen.  The appendix is normal in caliber and contains air, without evidence for appendicitis.  The colon is grossly unremarkable in appearance.  The bladder is mildly distended and grossly unremarkable.  The uterus is within normal limits.  The ovaries are relatively symmetric; no suspicious adnexal masses are seen.  No inguinal  lymphadenopathy is seen.  No acute osseous abnormalities are identified.  IMPRESSION:  1.  Mild right-sided hydronephrosis, with an obstructing 4 mm stone noted in the proximal right ureter, approximately 3 cm below the right renal pelvis. 2.  Scattered small nonobstructing bilateral renal stones seen.  Original Report Authenticated By: Tonia Ghent, M.D.   CT resulted with right 4mm calculus with right sided hydronephrosis. Appendix normal. Suspect this is what causes pts symptoms. Pt now received pain medications. She is feeling much better. She is tolerating PO fluids.   1. Kidney stone on right side       MDM  Pt with right flank pain, 4mm stone in right proxima ureter per CT  abd/pelv. Pt is non toxic afebrile, although ua back with few bacteria. Will cover with antibiotics. Pt's pain is under cotnrol. She is tolerating PO fluids. She has appointment for follow up tomorrow with urology.         Lottie Mussel, PA 03/30/12 1526

## 2012-03-31 ENCOUNTER — Ambulatory Visit (HOSPITAL_COMMUNITY)
Admission: EM | Admit: 2012-03-31 | Discharge: 2012-03-31 | Disposition: A | Discharge status: Home / Self Care | Payer: 59 | Source: Ambulatory Visit | Attending: Urology | Admitting: Urology

## 2012-03-31 ENCOUNTER — Other Ambulatory Visit: Payer: Self-pay | Admitting: Urology

## 2012-03-31 ENCOUNTER — Encounter (HOSPITAL_COMMUNITY)
Admission: EM | Disposition: A | Discharge status: Home / Self Care | Payer: Self-pay | Source: Ambulatory Visit | Attending: Urology

## 2012-03-31 ENCOUNTER — Encounter (HOSPITAL_COMMUNITY): Payer: Self-pay | Admitting: Anesthesiology

## 2012-03-31 ENCOUNTER — Ambulatory Visit (HOSPITAL_COMMUNITY): Payer: 59 | Admitting: Anesthesiology

## 2012-03-31 DIAGNOSIS — N1330 Unspecified hydronephrosis: Secondary | ICD-10-CM | POA: Insufficient documentation

## 2012-03-31 DIAGNOSIS — N201 Calculus of ureter: Secondary | ICD-10-CM | POA: Insufficient documentation

## 2012-03-31 DIAGNOSIS — Z79899 Other long term (current) drug therapy: Secondary | ICD-10-CM | POA: Insufficient documentation

## 2012-03-31 LAB — URINE CULTURE: Culture  Setup Time: 201304090846

## 2012-03-31 SURGERY — CYSTOURETEROSCOPY, WITH RETROGRADE PYELOGRAM AND STENT INSERTION
Anesthesia: General | Site: Ureter | Laterality: Right | Wound class: Clean Contaminated

## 2012-03-31 MED ORDER — FENTANYL CITRATE 0.05 MG/ML IJ SOLN: 25.0000 ug | INTRAMUSCULAR | Status: DC | PRN

## 2012-03-31 MED ORDER — CEFAZOLIN SODIUM 1-5 GM-% IV SOLN
1.0000 g | INTRAVENOUS | Status: AC
  Administered 2012-03-31: 1 g via INTRAVENOUS

## 2012-03-31 MED ORDER — ONDANSETRON HCL 4 MG/2ML IJ SOLN
INTRAMUSCULAR | Status: DC | PRN
  Administered 2012-03-31: 4 mg via INTRAVENOUS

## 2012-03-31 MED ORDER — STERILE WATER FOR IRRIGATION IR SOLN
Status: DC | PRN
  Administered 2012-03-31: 3000 mL

## 2012-03-31 MED ORDER — PROMETHAZINE HCL 12.5 MG PO TABS: 12.5000 mg | ORAL_TABLET | Freq: Four times a day (QID) | ORAL | Status: AC | PRN

## 2012-03-31 MED ORDER — CEFAZOLIN SODIUM 1-5 GM-% IV SOLN
INTRAVENOUS | Status: AC
  Filled 2012-03-31: qty 50

## 2012-03-31 MED ORDER — PHENAZOPYRIDINE HCL 200 MG PO TABS: 200.0000 mg | ORAL_TABLET | Freq: Three times a day (TID) | ORAL | Status: AC | PRN

## 2012-03-31 MED ORDER — DEXAMETHASONE SODIUM PHOSPHATE 10 MG/ML IJ SOLN
INTRAMUSCULAR | Status: DC | PRN
  Administered 2012-03-31: 10 mg via INTRAVENOUS

## 2012-03-31 MED ORDER — LACTATED RINGERS IV SOLN: INTRAVENOUS | Status: DC

## 2012-03-31 MED ORDER — IOHEXOL 300 MG/ML  SOLN
INTRAMUSCULAR | Status: DC | PRN
  Administered 2012-03-31: 8 mL

## 2012-03-31 MED ORDER — IOHEXOL 300 MG/ML  SOLN
INTRAMUSCULAR | Status: AC
  Filled 2012-03-31: qty 1

## 2012-03-31 MED ORDER — FENTANYL CITRATE 0.05 MG/ML IJ SOLN
INTRAMUSCULAR | Status: DC | PRN
  Administered 2012-03-31 (×2): 25 ug via INTRAVENOUS

## 2012-03-31 MED ORDER — LIDOCAINE HCL (CARDIAC) 20 MG/ML IV SOLN
INTRAVENOUS | Status: DC | PRN
  Administered 2012-03-31: 50 mg via INTRAVENOUS

## 2012-03-31 MED ORDER — PROMETHAZINE HCL 25 MG/ML IJ SOLN: 6.2500 mg | INTRAMUSCULAR | Status: DC | PRN

## 2012-03-31 MED ORDER — SODIUM CHLORIDE 0.9 % IV SOLN
INTRAVENOUS | Status: DC
  Administered 2012-03-31: 1000 mL via INTRAVENOUS

## 2012-03-31 MED ORDER — PHENAZOPYRIDINE HCL 200 MG PO TABS
ORAL_TABLET | ORAL | Status: AC
  Administered 2012-03-31: 200 mg via OROMUCOSAL
  Filled 2012-03-31: qty 1

## 2012-03-31 MED ORDER — OXYCODONE-ACETAMINOPHEN 5-325 MG PO TABS: 1.0000 | ORAL_TABLET | ORAL | Status: AC | PRN

## 2012-03-31 MED ORDER — OXYCODONE-ACETAMINOPHEN 5-325 MG PO TABS
ORAL_TABLET | ORAL | Status: AC
  Administered 2012-03-31: 2 via OROMUCOSAL
  Filled 2012-03-31: qty 2

## 2012-03-31 MED ORDER — ONDANSETRON HCL 4 MG/2ML IJ SOLN: 4.0000 mg | Freq: Four times a day (QID) | INTRAMUSCULAR | Status: DC | PRN

## 2012-03-31 MED ORDER — PHENAZOPYRIDINE HCL 200 MG PO TABS
ORAL_TABLET | ORAL | Status: AC
  Filled 2012-03-31: qty 1

## 2012-03-31 MED ORDER — LACTATED RINGERS IV SOLN
INTRAVENOUS | Status: DC
  Administered 2012-03-31: 1000 mL via INTRAVENOUS

## 2012-03-31 MED ORDER — MORPHINE SULFATE 2 MG/ML IJ SOLN: 2.0000 mg | INTRAMUSCULAR | Status: DC | PRN

## 2012-03-31 MED ORDER — LACTATED RINGERS IV SOLN
INTRAVENOUS | Status: DC | PRN
  Administered 2012-03-31: 17:00:00 via INTRAVENOUS

## 2012-03-31 MED ORDER — BELLADONNA ALKALOIDS-OPIUM 16.2-60 MG RE SUPP
RECTAL | Status: AC
  Filled 2012-03-31: qty 1

## 2012-03-31 MED ORDER — MEPERIDINE HCL 50 MG/ML IJ SOLN: 6.2500 mg | INTRAMUSCULAR | Status: DC | PRN

## 2012-03-31 MED ORDER — SODIUM CHLORIDE 0.9 % IR SOLN
Status: DC | PRN
  Administered 2012-03-31: 1000 mL

## 2012-03-31 MED ORDER — PROPOFOL 10 MG/ML IV BOLUS
INTRAVENOUS | Status: DC | PRN
  Administered 2012-03-31: 150 mg via INTRAVENOUS

## 2012-03-31 SURGICAL SUPPLY — 18 items
BAG URO CATCHER STRL LF (DRAPE) ×3 IMPLANT
CATH INTERMIT  6FR 70CM (CATHETERS) ×2 IMPLANT
CATH URET 5FR 28IN OPEN ENDED (CATHETERS) ×1 IMPLANT
CLOTH BEACON ORANGE TIMEOUT ST (SAFETY) ×3 IMPLANT
DRAPE CAMERA CLOSED 9X96 (DRAPES) ×3 IMPLANT
FLEXI-TIP DUAL LUMEN URETERAL ACCESS CATHETER ×2 IMPLANT
GLOVE SURG SS PI 8.0 STRL IVOR (GLOVE) ×3 IMPLANT
GOWN PREVENTION PLUS XLARGE (GOWN DISPOSABLE) ×3 IMPLANT
GOWN STRL REIN XL XLG (GOWN DISPOSABLE) ×3 IMPLANT
GUIDEWIRE STR DUAL SENSOR (WIRE) ×2 IMPLANT
IV NS 1000ML (IV SOLUTION) ×3
IV NS 1000ML BAXH (IV SOLUTION) ×1 IMPLANT
MANIFOLD NEPTUNE II (INSTRUMENTS) ×3 IMPLANT
MARKER SKIN DUAL TIP RULER LAB (MISCELLANEOUS) ×3 IMPLANT
PACK CYSTO (CUSTOM PROCEDURE TRAY) ×3 IMPLANT
SHEATH ACCESS URETERAL 24CM (SHEATH) ×2 IMPLANT
TUBING CONNECTING 10 (TUBING) ×3 IMPLANT
WATER STERILE IRR 3000ML UROMA (IV SOLUTION) ×2 IMPLANT

## 2012-03-31 NOTE — Transfer of Care (Signed)
Immediate Anesthesia Transfer of Care Note  Patient: [name]  Procedure(s) Performed: Procedure(s) (LRB): CYSTOSCOPY/RETROGRADE/URETEROSCOPY/STONE EXTRACTION WITH BASKET (Right) HOLMIUM LASER APPLICATION (Right)  Patient Location: PACU  Anesthesia Type: General  Level of Consciousness: sedated  Airway & Oxygen Therapy: Patient Spontanous Breathing and Patient connected to face mask oxygen  Post-op Assessment: Report given to PACU RN and Post -op Vital signs reviewed and stable  Post vital signs: Reviewed and stable  Complications: No apparent anesthesia complications

## 2012-03-31 NOTE — Anesthesia Preprocedure Evaluation (Signed)
Anesthesia Evaluation  Patient identified by MRN, date of birth, ID band Patient awake    Reviewed: Allergy & Precautions, H&P , NPO status , Patient's Chart, lab work & pertinent test results  Airway Mallampati: II TM Distance: >3 FB Neck ROM: full    Dental No notable dental hx.    Pulmonary neg pulmonary ROS, Current Smoker,  breath sounds clear to auscultation  Pulmonary exam normal       Cardiovascular Exercise Tolerance: Good negative cardio ROS  Rhythm:regular Rate:Normal     Neuro/Psych negative neurological ROS  negative psych ROS   GI/Hepatic negative GI ROS, Neg liver ROS,   Endo/Other  negative endocrine ROS  Renal/GU negative Renal ROS  negative genitourinary   Musculoskeletal   Abdominal   Peds  Hematology negative hematology ROS (+)   Anesthesia Other Findings   Reproductive/Obstetrics negative OB ROS                           Anesthesia Physical Anesthesia Plan  ASA: II  Anesthesia Plan: General   Post-op Pain Management:    Induction:   Airway Management Planned:   Additional Equipment:   Intra-op Plan:   Post-operative Plan:   Informed Consent: I have reviewed the patients History and Physical, chart, labs and discussed the procedure including the risks, benefits and alternatives for the proposed anesthesia with the patient or authorized representative who has indicated his/her understanding and acceptance.   Dental Advisory Given  Plan Discussed with: CRNA  Anesthesia Plan Comments:         Anesthesia Quick Evaluation
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
## 2012-03-31 NOTE — Discharge Instructions (Signed)
Ureteral Stent A ureteral stent is a soft plastic tube with multiple holes. The stent is inserted into a ureter to help drain urine from the kidney into the bladder. The tube has a coil on each end to keep it from falling out. One end stays in the kidney. The other end stays in the bladder. A stent cannot be seen from the outside. Usually it does not keep you from going about normal routines. A ureteral stent is used to bypass a blockage in your kidney or ureter. This blockage can be caused by kidney stones, scar tissue, pregnancy, or other causes. It can also be used during treatment to remove a kidney stone or to let a ureter heal after surgery. The stent allows urine to drain from the kidney into the bladder. It is most often taken out after the blockage has been removed or the ureter has healed. If a stent is needed for a long time, it will be changed every few months. INSERTING THE STENT Your stent is put in by a urologist. This is a medical doctor trained for treating genitourinary (kidney, ureter and bladder) problems. Before your stent is put in, your caregiver may order x-rays or other imaging tests of your kidneys and ureters. The stent is inserted in a hospital or same day surgical center. You can anticipate going home the same day. PROCEDURE  A special x-ray machine called a fluoroscope is used to guide the insertion of your stent. This allows your doctor to make sure the stent is in the correct place.   First you are given anesthesia to keep you comfortable.   Then your doctor inserts a special lighted instrument called a cystoscope into your bladder. This allows your doctor to see the opening to the ureter.   A thin wire is carefully threaded into the bladder and up the ureter. The stent is inserted over the wire and the wire is then removed.  HOME CARE INSTRUCTIONS   While the stent is in place, you may feel some discomfort. Certain movements may trigger pain or a feeling that you need  to urinate. Your caregiver may give you pain medication. Only take over-the-counter or prescription medicines for pain, discomfort, or fever as directed by your caregiver. Do not take aspirin as this can make bleeding worse.   You may be given medications to prevent infection or bladder spasms. Be sure to take all medications as directed.   Drink plenty of fluids.   You may have small amounts of bleeding causing your urine to be slightly red. This is nothing to be concerned about.  REMOVAL OF THE STENT Your stent is left in until the blockage is resolved/stone removed. Before the stent is removed, you may have an x-ray make sure the ureter is open. The stent can be removed by your caregiver in the office. Medications may be given for comfort. BE SURE TO KEEP FOLLOW-UP APPOINTMENTS, so your caregiver can PLAN TO REMOVE THE STONE AND STENT.  SEEK IMMEDIATE MEDICAL CARE IF:   Your urine is dark red or has blood clots.   You are incontinent (leaking urine).   You have an oral temperature above 102 F (38.9 C), chills, nausea (feeling sick to your stomach), or vomiting.   Your pain is not relieved by pain medication. Do not take aspirin as this can make bleeding worse.   The end of the stent comes out of the urethra.  Document Released: 12/05/2000 Document Revised: 11/27/2011 Document Reviewed: 12/04/2008 [employer]  Patient Information 2012 [employer]. 

## 2012-03-31 NOTE — Progress Notes (Signed)
Patient has remained stable. Sleeping at long interval No nausea or vomiting. Mother at bedside.

## 2012-03-31 NOTE — Anesthesia Postprocedure Evaluation (Signed)
  Anesthesia Post-op Note  Patient: [name]  Procedure(s) Performed: Procedure(s) (LRB): CYSTOSCOPY WITH RETROGRADE PYELOGRAM, URETEROSCOPY AND STENT PLACEMENT (Right)  Patient Location: PACU  Anesthesia Type: General  Level of Consciousness: awake and alert   Airway and Oxygen Therapy: Patient Spontanous Breathing  Post-op Pain: mild  Post-op Assessment: Post-op Vital signs reviewed, Patient's Cardiovascular Status Stable, Respiratory Function Stable, Patent Airway and No signs of Nausea or vomiting  Post-op Vital Signs: stable  Complications: No apparent anesthesia complications

## 2012-03-31 NOTE — Brief Op Note (Signed)
03/31/2012  6:36 PM  PATIENT:  [name]  26 y.o. female  PRE-OPERATIVE DIAGNOSIS:  Right Ureteral Stone, Right hydronephrosis  POST-OPERATIVE DIAGNOSIS:  same  PROCEDURE:  Procedure(s) (LRB): CYSTOSCOPY/RETROGRADE PYELOGRAM/URETEROSCOPY (Right), ureteral stent placement  SURGEON:  Surgeon(s) and Role:    * Antony Haste, MD - Primary  ANESTHESIA:   general  RIGHT RETROGRADE PYELOGRAM - no obvious stone on [facility] imaging. Right distal and mid ureter normal , 4 mm filling defect in mid-proximal ureter c/w ureteral stone, proximal ureteral / renal pelvic and calyceal dilation without filling defects.   Findings - wire was difficult to get past stone. Needed bracing with 6 Fr ureteral catheter.  Distal ureter normal after removal of access sheath via URS.   EBL:   minimal  BLOOD ADMINISTERED:none  DRAINS: ureteral stent 6 x 24 cm RIGHT  LOCAL MEDICATIONS USED:  NONE  SPECIMEN:  No Specimen  DISPOSITION OF SPECIMEN:  N/A  COUNTS:  YES  TOURNIQUET:  * No tourniquets in log *  DICTATION: [id number]  PLAN OF CARE: Discharge to home after PACU  PATIENT DISPOSITION:  PACU - hemodynamically stable.   Delay start of Pharmacological VTE agent (>24hrs) due to surgical blood loss or risk of bleeding: not applicable

## 2012-04-01 NOTE — Op Note (Signed)
[name], [name]             ACCOUNT NO.:  [id number]  MEDICAL RECORD NO.:  [id number]  LOCATION:  [facility]                         FACILITY:  [facility], [place]  PHYSICIAN:  Jerilee Field, MD   DATE OF BIRTH:  09/14/1986  DATE OF PROCEDURE:  03/31/2012 DATE OF DISCHARGE:  03/31/2012                              OPERATIVE REPORT   PREOPERATIVE DIAGNOSES: 1. Right ureteral stone. 2. Right hydronephrosis.  POSTOPERATIVE DIAGNOSES: 1. Right ureteral stone. 2. Right hydronephrosis.  PROCEDURES: 1. Cystoscopy with right retrograde pyelogram. 2. Right ureteroscopy. 3. Right ureteral stent placement.  SURGEON:  Jerilee Field, MD  TYPE OF ANESTHESIA:  General.  INDICATION FOR PROCEDURE:  [name] is a 26 year old white female who developed severe right flank pain 2 days ago.  She was seen in the office earlier and had uncontrolled pain, nausea, and vomiting. Therefore, we discussed the nature of risks and benefits of cystoscopy with right ureteral stent with possible right ureteroscopy, laser lithotripsy, and stone extraction.  All questions were answered.  I discussed alternatives such as nontreatment and shockwave lithotripsy. We also discussed all of the information with the patient and her mother.  All questions were answered and they elected to proceed with endoscopic management as above.  FINDINGS ON CYSTOSCOPY:  The bladder was normal.  Upon passing the wire up the right ureter when the wire met the stone, it would not go past the stone, indicating significant obstruction of the ureter with the stone.  I had to slide a 6-French stent up the ureter to brace the wire and then the wire went past the stone and coiled in the right upper pole.  I could not get any access above the right iliacs with the access sheath of the scope.  I did not want to try to pass the rigid ureteroscope.  As the ureter was quite tight going over the iliacs and I could not be able to use the access  sheath for drainage.  RIGHT RETROGRADE PYELOGRAM:  On scout imaging, there were no obvious stones.  After retrograde injection of contrast, the distal and mid ureter were normal in appearance.  There was a filling defect that was about 4 mm in size and round in the mid proximal ureter consistent with the ureteral stone seen on CT.  Proximal to this, there was some dilation of the proximal ureter, renal pelvis, and calices without filling defects.  DESCRIPTION OF PROCEDURE:  After consent was obtained, the patient was brought to the operating room.  A time-out was performed to confirm the patient and procedure.  After adequate anesthesia, she was placed in lithotomy position, and prepped and draped in the usual fashion.  A cystoscope was passed per urethra.  The right retrograde pyelogram was obtained with retrograde injection of contrast after cannulating the right ureter with a 6-French open-ended catheter.  The patient had 3 or 4 pelvic phleboliths out side of the ureter that were seen on the scout image.  After injection of contrast, a Sensor wire was advanced through the 6-French catheter.  The Sensor wire advanced to the level of the stone, but coiled here and would not go past the stone.  Therefore, this 6-French catheter  was advanced up the ureter towards the stone, and once the wire was braced, it did finally pushed past the stone and may have pushed the stone back up into the kidney.  The wire was coiled in the right upper pole and the cystoscope was removed.  The mid and distal ureter looked to be of normal caliber and to potentially accommodate the ureteroscope.  Therefore, a dual-lumen exchange catheter was advanced without difficulty and a second Sensor wire was advanced and coiled up into the right upper pole.  The dual-lumen would not advanced much past the right iliac vessels.  The dual-lumen was removed.  I tried to advance an access sheath. But it would not advance  into the mid ureter. I did remove the access issues and tried to dilate the ureter with the inner cannula.  Again, the inner cannula would not advance into the mid ureter.  I then placed the access sheath again which went into the distal ureter.  I removed the cannula.  I advanced the digital ureteroscope which was advanced into the distal ureter, but would not go over the iliacs.  The ureter narrowed at the iliacs into the normal sharp curvature at the iliacs up toward the kidney and the ureteroscope would not advanced past this area.  The ureteral access sheath was then backed out on the ureteroscope.  Inspection of the ureter from the iliac down to the bladder noted to be normal without any injury.  The ureteroscope was then removed and the wire was back loaded on the cystoscope.  A 6 x 24 stent was advanced without difficulty.  The wire was removed.  A good coil was seen in the right upper pole and a good coil in the kidney.  The patient's bladder was drained.  She was then awakened in the recovery room stable condition.  COMPLICATIONS:  None.  ESTIMATED BLOOD LOSS:  Minimal.  DRAINS:  A 6 x 24 cm right ureteral stent.  SPECIMENS:  None.  DISPOSITION:  The patient is stable to PACU.          ______________________________ Jerilee Field, MD     ME/MEDQ  D:  03/31/2012  T:  04/01/2012  Job:  [id number]

## 2012-04-02 MED FILL — Mupirocin Oint 2%: CUTANEOUS | Qty: 22 | Status: AC

## 2012-04-13 ENCOUNTER — Other Ambulatory Visit: Payer: Self-pay | Admitting: Urology

## 2012-04-14 ENCOUNTER — Encounter (HOSPITAL_COMMUNITY): Payer: Self-pay | Admitting: Family Medicine

## 2012-04-14 ENCOUNTER — Emergency Department (HOSPITAL_COMMUNITY)
Admission: EM | Admit: 2012-04-14 | Discharge: 2012-04-15 | Disposition: A | Discharge status: Home / Self Care | Payer: 59 | Attending: Emergency Medicine | Admitting: Emergency Medicine

## 2012-04-14 DIAGNOSIS — K648 Other hemorrhoids: Secondary | ICD-10-CM | POA: Insufficient documentation

## 2012-04-14 DIAGNOSIS — N200 Calculus of kidney: Secondary | ICD-10-CM | POA: Insufficient documentation

## 2012-04-14 DIAGNOSIS — R109 Unspecified abdominal pain: Secondary | ICD-10-CM | POA: Insufficient documentation

## 2012-04-14 DIAGNOSIS — N390 Urinary tract infection, site not specified: Secondary | ICD-10-CM

## 2012-04-14 DIAGNOSIS — K625 Hemorrhage of anus and rectum: Secondary | ICD-10-CM | POA: Insufficient documentation

## 2012-04-14 NOTE — ED Notes (Addendum)
Patient states that she started having rectal bleeding around 8pm. States "it looks like tomato paste was dumped in the toilet."  Just finished a course of Cipro 6 days ago.

## 2012-04-15 ENCOUNTER — Emergency Department (HOSPITAL_COMMUNITY): Payer: 59

## 2012-04-15 ENCOUNTER — Telehealth: Payer: Self-pay | Admitting: Family Medicine

## 2012-04-15 DIAGNOSIS — K625 Hemorrhage of anus and rectum: Secondary | ICD-10-CM

## 2012-04-15 LAB — BASIC METABOLIC PANEL
CO2: 21 mEq/L (ref 19–32)
Chloride: 105 mEq/L (ref 96–112)
Creatinine, Ser: 0.54 mg/dL (ref 0.50–1.10)
Potassium: 3.7 mEq/L (ref 3.5–5.1)
Sodium: 140 mEq/L (ref 135–145)

## 2012-04-15 LAB — URINALYSIS, ROUTINE W REFLEX MICROSCOPIC
Glucose, UA: NEGATIVE mg/dL
Nitrite: NEGATIVE
Specific Gravity, Urine: 1.016 (ref 1.005–1.030)
pH: 7.5 (ref 5.0–8.0)

## 2012-04-15 LAB — DIFFERENTIAL
Basophils Absolute: 0.1 10*3/uL (ref 0.0–0.1)
Lymphocytes Relative: 41 % (ref 12–46)
Monocytes Absolute: 0.4 10*3/uL (ref 0.1–1.0)
Neutro Abs: 3.4 10*3/uL (ref 1.7–7.7)
Neutrophils Relative %: 50 % (ref 43–77)

## 2012-04-15 LAB — CBC
HCT: 38.1 % (ref 36.0–46.0)
RDW: 13.1 % (ref 11.5–15.5)
WBC: 6.7 10*3/uL (ref 4.0–10.5)

## 2012-04-15 LAB — URINE MICROSCOPIC-ADD ON

## 2012-04-15 LAB — OCCULT BLOOD, POC DEVICE: Fecal Occult Bld: POSITIVE

## 2012-04-15 MED ORDER — IOHEXOL 300 MG/ML  SOLN
80.0000 mL | Freq: Once | INTRAMUSCULAR | Status: AC | PRN
  Administered 2012-04-15: 80 mL via INTRAVENOUS

## 2012-04-15 MED ORDER — OXYCODONE-ACETAMINOPHEN 5-325 MG PO TABS: 2.0000 | ORAL_TABLET | ORAL | Status: AC | PRN

## 2012-04-15 MED ORDER — LEVOFLOXACIN 750 MG PO TABS: 750.0000 mg | ORAL_TABLET | Freq: Every day | ORAL | Status: DC

## 2012-04-15 MED ORDER — SODIUM CHLORIDE 0.9 % IV BOLUS (SEPSIS)
1000.0000 mL | Freq: Once | INTRAVENOUS | Status: AC
  Administered 2012-04-15: 1000 mL via INTRAVENOUS

## 2012-04-15 MED ORDER — MORPHINE SULFATE 4 MG/ML IJ SOLN
4.0000 mg | Freq: Once | INTRAMUSCULAR | Status: AC
  Administered 2012-04-15: 4 mg via INTRAVENOUS
  Filled 2012-04-15: qty 1

## 2012-04-15 NOTE — Discharge Instructions (Signed)
Rectal Bleeding Rectal bleeding is when blood passes out of the anus. It is usually a sign that something is wrong. It may not be serious, but it should always be evaluated. Rectal bleeding may present as bright red blood or extremely dark stools. The color may range from dark red or maroon to black (like tar). It is important that the cause of rectal bleeding be identified so treatment can be started and the problem corrected. CAUSES   Hemorrhoids. These are enlarged (dilated) blood vessels or veins in the anal or rectal area.   Fistulas. Theseare abnormal, burrowing channels that usually run from inside the rectum to the skin around the anus. They can bleed.   Anal fissures. This is a tear in the tissue of the anus. Bleeding occurs with bowel movements.   Diverticulosis. This is a condition in which pockets or sacs project from the bowel wall. Occasionally, the sacs can bleed.   Diverticulitis. Thisis an infection involving diverticulosis of the colon.   Proctitis and colitis. These are conditions in which the rectum, colon, or both, can become inflamed and pitted (ulcerated).   Polyps and cancer. Polyps are non-cancerous (benign) growths in the colon that may bleed. Certain types of polyps turn into cancer.   Protrusion of the rectum. Part of the rectum can project from the anus and bleed.   Certain medicines.   Intestinal infections.   Blood vessel abnormalities.  HOME CARE INSTRUCTIONS  Eat a high-fiber diet to keep your stool soft.   Limit activity.   Drink enough fluids to keep your urine clear or pale yellow.   Warm baths may be useful to soothe rectal pain.   Follow up with your caregiver as directed.  SEEK IMMEDIATE MEDICAL CARE IF:  You develop increased bleeding.   You have black or dark red stools.   You vomit blood or material that looks like coffee grounds.   You have abdominal pain or tenderness.   You have a fever.   You feel weak, nauseous, or you  faint.   You have severe rectal pain or you are unable to have a bowel movement.  MAKE SURE YOU:  Understand these instructions.   Will watch your condition.   Will get help right away if you are not doing well or get worse.  Document Released: 05/30/2002 Document Revised: 11/27/2011 Document Reviewed: 05/25/2011 [facility] [place] [facility] Patient Information 2012 [place], [place].  Urinary Tract Infection Infections of the urinary tract can start in several places. A bladder infection (cystitis), a kidney infection (pyelonephritis), and a prostate infection (prostatitis) are different types of urinary tract infections (UTIs). They usually get better if treated with medicines (antibiotics) that kill germs. Take all the medicine until it is gone. You or your child may feel better in a few days, but TAKE ALL MEDICINE or the infection may not respond and may become more difficult to treat. HOME CARE INSTRUCTIONS   Drink enough water and fluids to keep the urine clear or pale yellow. Cranberry juice is especially recommended, in addition to large amounts of water.   Avoid caffeine, tea, and carbonated beverages. They tend to irritate the bladder.   Alcohol may irritate the prostate.   Only take over-the-counter or prescription medicines for pain, discomfort, or fever as directed by your caregiver.  To prevent further infections:  Empty the bladder often. Avoid holding urine for long periods of time.   After a bowel movement, women should cleanse from front to back. Use each tissue only  once.   Empty the bladder before and after sexual intercourse.  FINDING OUT THE RESULTS OF YOUR TEST Not all test results are available during your visit. If your or your child's test results are not back during the visit, make an appointment with your caregiver to find out the results. Do not assume everything is normal if you have not heard from your caregiver or the medical facility. It is important for you to follow up on  all test results. SEEK MEDICAL CARE IF:   There is back pain.   Your baby is older than 3 months with a rectal temperature of 100.5 F (38.1 C) or higher for more than 1 day.   Your or your child's problems (symptoms) are no better in 3 days. Return sooner if you or your child is getting worse.  SEEK IMMEDIATE MEDICAL CARE IF:   There is severe back pain or lower abdominal pain.   You or your child develops chills.   You have a fever.   Your baby is older than 3 months with a rectal temperature of 102 F (38.9 C) or higher.   Your baby is 37 months old or younger with a rectal temperature of 100.4 F (38 C) or higher.   There is nausea or vomiting.   There is continued burning or discomfort with urination.  MAKE SURE YOU:   Understand these instructions.   Will watch your condition.   Will get help right away if you are not doing well or get worse.  Document Released: 09/17/2005 Document Revised: 11/27/2011 Document Reviewed: 04/22/2007 [facility] Patient Information 2012 [place], [place].

## 2012-04-15 NOTE — ED Provider Notes (Signed)
History     CSN: [id number]  Arrival date & time 04/14/12  2250   First MD Initiated Contact with Patient 04/15/12 0010      Chief Complaint  Patient presents with  . Rectal Bleeding    (Consider location/radiation/quality/duration/timing/severity/associated sxs/prior treatment) HPI  25yoF h/o recent nephrolithiasis s/p stent, recent ciprofloxacin use pw abdominal pain, rectal bleeding. The patient states that she began to feel severe right lower quadrant cramping around 8 PM yesterday. She states that she sat on the toilet and without straining or having a bowel movement bright red blood washout of her rectum into the toilet. She describes it as tomato paste. She denies rectal pain. She denies nausea, vomiting. There has been no constipation or diarrhea. She denies fevers, chills. She rates her right lower quadrant pain as 5 at 10 at this time. There is no radiation. She has experienced hematuria for approximately one month associated with nephrolithiasis 3 she status post stent placement. She is quite sure that the blood is coming from her rectum and not her vagina or her urine. No history of similar. No history of chronic constipation. She did not take anticoagulants and has not been on NSAIDS for pain control   ED Notes, ED Provider Notes from 04/14/12 0000 to 04/14/12 23:19:50       Dorris Carnes, RN 04/14/2012 23:19      Patient states that she started having rectal bleeding around 8pm. States "it looks like tomato paste was dumped in the toilet." Just finished a course of Cipro 6 days ago.                 Original note by Dorris Carnes, RN at 04/14/2012 23:16         Dorris Carnes, RN 04/14/2012 23:16      Patient states that she started having rectal bleeding around 8pm. States "it looks like tomato paste was dumped in the toilet."     Past Medical History  Diagnosis Date  . Endometriosis   . Kidney stones   . Kidney infection   . Lumbar facet arthropathy   .  Kidney stones   . Cancer     Past Surgical History  Procedure Date  . Cervical cancer     with surgeries  . Hand surgery   . Hand surgery     right hand  . Dental surgery   . Cervical biopsy  w/ loop electrode excision     2 WEEKS AGO    Family History  Problem Relation Age of Onset  . Cancer Mother   . Heart failure Father     History  Substance Use Topics  . Smoking status: Current Everyday Smoker -- 0.5 packs/day    Types: Cigarettes  . Smokeless tobacco: Not on file  . Alcohol Use: Yes     Ocassionally    OB History    Grav Para Term Preterm Abortions TAB SAB Ect Mult Living                  Review of Systems  All other systems reviewed and are negative.   except as noted HPI   Allergies  Review of patient's allergies indicates no known allergies.  Home Medications   Current Outpatient Rx  Name Route Sig Dispense Refill  . PHENAZOPYRIDINE HCL 200 MG PO TABS Oral Take 200 mg by mouth 3 (three) times daily as needed.    Marland Kitchen PROMETHAZINE HCL 12.5 MG PO TABS Oral  Take 25 mg by mouth every 6 (six) hours as needed. For nausea.    Marland Kitchen LEVOFLOXACIN 750 MG PO TABS Oral Take 1 tablet (750 mg total) by mouth daily. 7 tablet 0  . OXYCODONE-ACETAMINOPHEN 5-325 MG PO TABS Oral Take 2 tablets by mouth every 4 (four) hours as needed for pain. 6 tablet 0    BP 98/52  Pulse 85  Temp(Src) 98.6 F (37 C) (Oral)  Resp 18  Wt 93 lb 6 oz (42.355 kg)  SpO2 97%  LMP 03/18/2012  Physical Exam  Nursing note and vitals reviewed. Constitutional: She is oriented to person, place, and time. She appears well-developed.  HENT:  Head: Atraumatic.  Mouth/Throat: Oropharynx is clear and moist.  Eyes: Conjunctivae and EOM are normal. Pupils are equal, round, and reactive to light.  Neck: Normal range of motion. Neck supple.  Cardiovascular: Normal rate, regular rhythm, normal heart sounds and intact distal pulses.   Pulmonary/Chest: Effort normal and breath sounds normal. No  respiratory distress. She has no wheezes. She has no rales.  Abdominal: Soft. She exhibits no distension. There is tenderness. There is no rebound and no guarding.       RLQ ttp  Genitourinary:       +pain with rectal exam Int hemorrhoids No fissure Min BRBPR  Musculoskeletal: Normal range of motion.  Neurological: She is alert and oriented to person, place, and time.  Skin: Skin is warm and dry. No rash noted.  Psychiatric: She has a normal mood and affect.   ED Course  Procedures (including critical care time)  Labs Reviewed  URINALYSIS, ROUTINE W REFLEX MICROSCOPIC - Abnormal; Notable for the following:    Color, Urine AMBER (*) BIOCHEMICALS MAY BE AFFECTED BY COLOR   APPearance CLOUDY (*)    Hgb urine dipstick LARGE (*)    Protein, ur >300 (*)    Urobilinogen, UA 2.0 (*)    Leukocytes, UA MODERATE (*)    All other components within normal limits  CBC  DIFFERENTIAL  BASIC METABOLIC PANEL  URINE MICROSCOPIC-ADD ON  OCCULT BLOOD X 1 CARD TO LAB, STOOL   Ct Abdomen Pelvis W Contrast  04/15/2012  *RADIOLOGY REPORT*  Clinical Data: Rectal bleeding.  Right lower quadrant pain.  White cell count 6.7.  Red cells and white cells in the urine.  CT ABDOMEN AND PELVIS WITH CONTRAST  Technique:  Multidetector CT imaging of the abdomen and pelvis was performed following the standard protocol during bolus administration of intravenous contrast.  Contrast: 80mL OMNIPAQUE IOHEXOL 300 MG/ML  SOLN  Comparison: 03/30/2012  Findings: The lung bases are clear.  The liver, spleen, gallbladder, pancreas, adrenal glands, abdominal aorta, and retroperitoneal lymph nodes are unremarkable.  Portal and mesenteric vessels appear patent.  Since the previous study, there has been interval placement of a right ureteral stent with proximal pigtail in the right renal pelvis and distal pigtail in the bladder.  The right ureteral stones seen previously is not visualized today.  There is suggestion of enhancement of  the wall of the renal pelvis which could be due to infection or inflammatory process.  Small sub centimeter parenchymal cyst in the right kidney.  Small stones in the lower pole left kidney without obstruction.  Largest of these measures about 4 mm diameter.  The stomach is unremarkable.  Small bowel are not distended.  Stool in the colon without distension or wall thickening.  No free air or free fluid in the abdomen.  Pelvis:  The appendix  is normal.  No bladder wall thickening. Uterus and adnexal structures are not enlarged and appears stable. No free or loculated pelvic fluid collections.  No evidence of diverticulitis.  Normal alignment of the lumbar vertebrae.  IMPRESSION: Interval placement of right ureteral stent without residual hydronephrosis.  There is some enhancement of the wall of the right renal pelvis which may represent inflammatory or infectious process.  Nonobstructing stones in the lower pole left kidney.  Original Report Authenticated By: Marlon Pel, M.D.    1. Rectal bleeding   2. Abdominal pain   3. UTI (lower urinary tract infection)   4. Internal hemorrhoid     MDM  PW rectal bleeding, likely 2/2 internal hemorrhoids. CT A/P without infectious colitis or appendicitis. No rectal abscess visualized. Hgb at baseline at this time. VSS--BP lower after morphine administration. Remains asx. No further episodes in the Emergency Dept. Possible UTI. Recent Abx for same. Will cover with levaquin. She has f/u with [facility]. No [facility] precluding discharge at this time. Given Precautions for return. PMD f/u.         Forbes Cellar, MD 04/15/12 [phone]

## 2012-04-15 NOTE — Telephone Encounter (Signed)
Patient was seen by [name].  She hasn't been seen at our office in 3 yrs.  Her mother,Gail Provencal,called and asked if you would see her.  Patient was seen at [facility] ER yesterday for internal hemorrhoids. [facility] didn't do anything to stop the bleeding. They did a CT and Labs. Patient has been bleeding and patient has lost 3lbs in the last 3 days and she now weighs 93lbs.  Patient's mother asked if you would see her. Patient underwent surgery for a stent for a kidney stone. She's going in to have the kidney stone removed on 04/20/12.  Please advise.

## 2012-04-15 NOTE — Telephone Encounter (Signed)
[name] needs to be seen for a follow up  from an ER visit at [facility] for blood in her stools. She wants to see you. Her last doctor here was [place] Constable in 2011. Just checking to see if this is ok with you before we schedule her.

## 2012-04-15 NOTE — Telephone Encounter (Signed)
That is fine  We may want to go ahead and refer her to GI now, however (there may not be much I can do in the office) Please ask if that is ok and I will get that referral going

## 2012-04-16 ENCOUNTER — Encounter: Payer: Self-pay | Admitting: Internal Medicine

## 2012-04-16 ENCOUNTER — Encounter (HOSPITAL_BASED_OUTPATIENT_CLINIC_OR_DEPARTMENT_OTHER): Payer: Self-pay | Admitting: *Deleted

## 2012-04-16 DIAGNOSIS — K625 Hemorrhage of anus and rectum: Secondary | ICD-10-CM | POA: Insufficient documentation

## 2012-04-16 DIAGNOSIS — K649 Unspecified hemorrhoids: Secondary | ICD-10-CM | POA: Insufficient documentation

## 2012-04-16 NOTE — Telephone Encounter (Signed)
I will do ref  

## 2012-04-16 NOTE — Telephone Encounter (Signed)
I spoke with patient's mother and she said that would be fine for her to be referred to GI.  Dondra Spry sees Dr Joette Catching she said whoever could see patient the soonest would be fine.

## 2012-04-16 NOTE — Progress Notes (Signed)
NPO AFTER MN WITH EXCEPTION OF WATER UNTIL 0630. ARRIVES AT 1130. CURRENT CBC, BMET (04-15-2012) AND URINE PREG. (04-09-2013I, NEGATIVE)  IN EPIC .  WILL TAKE ACIPHEX AND IF NEEDED PERCOCET AM OF SURG. W/ SIP OF WATER.
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
## 2012-04-20 ENCOUNTER — Encounter (HOSPITAL_BASED_OUTPATIENT_CLINIC_OR_DEPARTMENT_OTHER)
Admission: RE | Disposition: A | Discharge status: Home / Self Care | Payer: Self-pay | Source: Ambulatory Visit | Attending: Urology

## 2012-04-20 ENCOUNTER — Ambulatory Visit (HOSPITAL_BASED_OUTPATIENT_CLINIC_OR_DEPARTMENT_OTHER)
Admission: RE | Admit: 2012-04-20 | Discharge: 2012-04-20 | Disposition: A | Discharge status: Home / Self Care | Payer: 59 | Source: Ambulatory Visit | Attending: Urology | Admitting: Urology

## 2012-04-20 ENCOUNTER — Encounter (HOSPITAL_BASED_OUTPATIENT_CLINIC_OR_DEPARTMENT_OTHER): Payer: Self-pay | Admitting: Certified Registered"

## 2012-04-20 ENCOUNTER — Encounter (HOSPITAL_BASED_OUTPATIENT_CLINIC_OR_DEPARTMENT_OTHER): Payer: Self-pay | Admitting: *Deleted

## 2012-04-20 ENCOUNTER — Ambulatory Visit (HOSPITAL_BASED_OUTPATIENT_CLINIC_OR_DEPARTMENT_OTHER): Payer: 59 | Admitting: Certified Registered"

## 2012-04-20 DIAGNOSIS — N201 Calculus of ureter: Secondary | ICD-10-CM | POA: Insufficient documentation

## 2012-04-20 DIAGNOSIS — R12 Heartburn: Secondary | ICD-10-CM | POA: Insufficient documentation

## 2012-04-20 DIAGNOSIS — N809 Endometriosis, unspecified: Secondary | ICD-10-CM | POA: Insufficient documentation

## 2012-04-20 DIAGNOSIS — Z79899 Other long term (current) drug therapy: Secondary | ICD-10-CM | POA: Insufficient documentation

## 2012-04-20 DIAGNOSIS — N1330 Unspecified hydronephrosis: Secondary | ICD-10-CM | POA: Insufficient documentation

## 2012-04-20 HISTORY — DX: Personal history of self-harm: Z91.5

## 2012-04-20 HISTORY — DX: Spondylosis, unspecified: M47.9

## 2012-04-20 HISTORY — DX: Personal history of urinary calculi: Z87.442

## 2012-04-20 HISTORY — DX: Gastro-esophageal reflux disease without esophagitis: K21.9

## 2012-04-20 HISTORY — DX: Alcohol abuse, in remission: F10.11

## 2012-04-20 HISTORY — DX: Urinary tract infection, site not specified: N39.0

## 2012-04-20 HISTORY — DX: Personal history of other mental and behavioral disorders: Z86.59

## 2012-04-20 HISTORY — PX: CYSTOSCOPY WITH URETEROSCOPY: SHX5123

## 2012-04-20 HISTORY — DX: Personal history of suicidal behavior: Z91.51

## 2012-04-20 HISTORY — DX: Frequency of micturition: R35.0

## 2012-04-20 HISTORY — DX: Carcinoma in situ of cervix, unspecified: D06.9

## 2012-04-20 SURGERY — CYSTOSCOPY WITH URETEROSCOPY
Anesthesia: General | Site: Ureter | Wound class: Clean Contaminated

## 2012-04-20 MED ORDER — KETOROLAC TROMETHAMINE 30 MG/ML IJ SOLN
INTRAMUSCULAR | Status: DC | PRN
  Administered 2012-04-20: 30 mg via INTRAVENOUS

## 2012-04-20 MED ORDER — BACITRACIN ZINC 500 UNIT/GM EX OINT
TOPICAL_OINTMENT | CUTANEOUS | Status: AC
  Filled 2012-04-20: qty 15

## 2012-04-20 MED ORDER — LACTATED RINGERS IV SOLN
INTRAVENOUS | Status: DC
  Administered 2012-04-20: 14:00:00 via INTRAVENOUS

## 2012-04-20 MED ORDER — PHENAZOPYRIDINE HCL 200 MG PO TABS
200.0000 mg | ORAL_TABLET | Freq: Three times a day (TID) | ORAL | Status: AC
  Administered 2012-04-20: 200 mg via ORAL

## 2012-04-20 MED ORDER — FENTANYL CITRATE 0.05 MG/ML IJ SOLN
INTRAMUSCULAR | Status: DC | PRN
  Administered 2012-04-20 (×2): 50 ug via INTRAVENOUS
  Administered 2012-04-20: 100 ug via INTRAVENOUS

## 2012-04-20 MED ORDER — LACTATED RINGERS IV SOLN
INTRAVENOUS | Status: DC
  Administered 2012-04-20: 100 mL/h via INTRAVENOUS

## 2012-04-20 MED ORDER — OXYCODONE-ACETAMINOPHEN 5-325 MG PO TABS: 1.0000 | ORAL_TABLET | ORAL | Status: AC | PRN

## 2012-04-20 MED ORDER — MIDAZOLAM HCL 5 MG/5ML IJ SOLN
INTRAMUSCULAR | Status: DC | PRN
  Administered 2012-04-20: 2 mg via INTRAVENOUS

## 2012-04-20 MED ORDER — HYDROCODONE-ACETAMINOPHEN 5-325 MG PO TABS
1.0000 | ORAL_TABLET | Freq: Four times a day (QID) | ORAL | Status: AC | PRN
  Administered 2012-04-20: 1 via ORAL

## 2012-04-20 MED ORDER — FENTANYL CITRATE 0.05 MG/ML IJ SOLN: 25.0000 ug | INTRAMUSCULAR | Status: DC | PRN

## 2012-04-20 MED ORDER — IOHEXOL 350 MG/ML SOLN
INTRAVENOUS | Status: DC | PRN
  Administered 2012-04-20: 5 mL via INTRAVENOUS

## 2012-04-20 MED ORDER — PROMETHAZINE HCL 25 MG/ML IJ SOLN: 6.2500 mg | INTRAMUSCULAR | Status: DC | PRN

## 2012-04-20 MED ORDER — SODIUM CHLORIDE 0.9 % IR SOLN
Status: DC | PRN
  Administered 2012-04-20: 6000 mL

## 2012-04-20 MED ORDER — PROPOFOL 10 MG/ML IV EMUL
INTRAVENOUS | Status: DC | PRN
  Administered 2012-04-20: 180 mg via INTRAVENOUS

## 2012-04-20 MED ORDER — BELLADONNA ALKALOIDS-OPIUM 16.2-60 MG RE SUPP
RECTAL | Status: DC | PRN
  Administered 2012-04-20: 1 via RECTAL

## 2012-04-20 MED ORDER — LIDOCAINE HCL (CARDIAC) 20 MG/ML IV SOLN
INTRAVENOUS | Status: DC | PRN
  Administered 2012-04-20: 60 mg via INTRAVENOUS

## 2012-04-20 MED ORDER — DEXAMETHASONE SODIUM PHOSPHATE 4 MG/ML IJ SOLN
INTRAMUSCULAR | Status: DC | PRN
  Administered 2012-04-20: 10 mg via INTRAVENOUS

## 2012-04-20 MED ORDER — CIPROFLOXACIN HCL 250 MG PO TABS: 500.0000 mg | ORAL_TABLET | Freq: Two times a day (BID) | ORAL | Status: AC

## 2012-04-20 MED ORDER — ONDANSETRON HCL 4 MG/2ML IJ SOLN
INTRAMUSCULAR | Status: DC | PRN
  Administered 2012-04-20: 4 mg via INTRAVENOUS

## 2012-04-20 MED ORDER — CEFAZOLIN SODIUM 1-5 GM-% IV SOLN
1.0000 g | INTRAVENOUS | Status: AC
  Administered 2012-04-20: 1 g via INTRAVENOUS

## 2012-04-20 SURGICAL SUPPLY — 37 items
ADAPTER CATH URET PLST 4-6FR (CATHETERS) IMPLANT
ADPR CATH URET STRL DISP 4-6FR (CATHETERS)
BAG DRAIN URO-CYSTO SKYTR STRL (DRAIN) ×2 IMPLANT
BAG DRN UROCATH (DRAIN) ×1
BASKET LASER NITINOL 1.9FR (BASKET) IMPLANT
BASKET STNLS GEMINI 4WIRE 3FR (BASKET) ×1 IMPLANT
BASKET ZERO TIP NITINOL 2.4FR (BASKET) IMPLANT
BRUSH URET BIOPSY 3F (UROLOGICAL SUPPLIES) IMPLANT
BSKT STON RTRVL 120 1.9FR (BASKET)
BSKT STON RTRVL GEM 120X11 3FR (BASKET) ×1
BSKT STON RTRVL ZERO TP 2.4FR (BASKET)
CANISTER SUCT LVC 12 LTR MEDI- (MISCELLANEOUS) ×1 IMPLANT
CATH INTERMIT  6FR 70CM (CATHETERS) ×1 IMPLANT
CATH URET 5FR 28IN CONE TIP (BALLOONS)
CATH URET 5FR 28IN OPEN ENDED (CATHETERS) IMPLANT
CATH URET 5FR 70CM CONE TIP (BALLOONS) IMPLANT
CLOTH BEACON ORANGE TIMEOUT ST (SAFETY) ×2 IMPLANT
DRAPE CAMERA CLOSED 9X96 (DRAPES) IMPLANT
ELECT REM PT RETURN 9FT ADLT (ELECTROSURGICAL)
ELECTRODE REM PT RTRN 9FT ADLT (ELECTROSURGICAL) IMPLANT
GLOVE BIO SURGEON STRL SZ7.5 (GLOVE) ×2 IMPLANT
GLOVE ECLIPSE 6.0 STRL STRAW (GLOVE) ×1 IMPLANT
GLOVE INDICATOR 6.5 STRL GRN (GLOVE) ×1 IMPLANT
GOWN PREVENTION PLUS LG XLONG (DISPOSABLE) ×2 IMPLANT
GOWN STRL REIN XL XLG (GOWN DISPOSABLE) ×2 IMPLANT
GUIDEWIRE 0.038 PTFE COATED (WIRE) ×2 IMPLANT
GUIDEWIRE ANG ZIPWIRE 038X150 (WIRE) IMPLANT
GUIDEWIRE STR DUAL SENSOR (WIRE) ×1 IMPLANT
IV NS IRRIG 3000ML ARTHROMATIC (IV SOLUTION) ×4 IMPLANT
KIT BALLIN UROMAX 15FX10 (LABEL) IMPLANT
KIT BALLN UROMAX 15FX4 (MISCELLANEOUS) IMPLANT
KIT BALLN UROMAX 26 75X4 (MISCELLANEOUS)
PACK CYSTOSCOPY (CUSTOM PROCEDURE TRAY) ×2 IMPLANT
SET HIGH PRES BAL DIL (LABEL)
SHEATH URET ACCESS 12FR/35CM (UROLOGICAL SUPPLIES) IMPLANT
SHEATH URET ACCESS 12FR/55CM (UROLOGICAL SUPPLIES) IMPLANT
SYRINGE IRR TOOMEY STRL 70CC (SYRINGE) IMPLANT

## 2012-04-20 NOTE — Discharge Instructions (Signed)
Right Ureteroscopy, stone removal and Cystoscopy (Right Ureter and Bladder Exam) Care After Refer to this sheet in the next few weeks. These discharge instructions provide you with general information on caring for yourself after you leave the hospital. Your caregiver may also give you specific instructions. Your treatment has been planned according to the most current medical practices available, but unavoidable complications sometimes occur. If you have any problems or questions after discharge, please call your caregiver.  AFTER THE PROCEDURE   There may be temporary bleeding and burning with urination.   Drink enough water and fluids to keep your urine clear or pale yellow.   SEEK IMMEDIATE MEDICAL CARE IF:   There is an increase in blood in the urine or you are passing clots.   There is difficulty passing urine.   You develop the chills.   You have an oral temperature above 102 F (38.9 C), not controlled by medicine.   Belly (abdominal) pain develops.  Document Released: 06/27/2005 Document Revised: 11/27/2011 Document Reviewed: 04/24/2008 ExitCare Patient Information 2012 [place] Post Anesthesia Home Care Instructions  Activity: Get plenty of rest for the remainder of the day. A responsible adult should stay with you for 24 hours following the procedure.  For the next 24 hours, DO NOT: -Drive a car -Advertising copywriter -Drink alcoholic beverages -Take any medication unless instructed by your physician -Make any legal decisions or sign important papers.  Meals: Start with liquid foods such as gelatin or soup. Progress to regular foods as tolerated. Avoid greasy, spicy, heavy foods. If nausea and/or vomiting occur, drink only clear liquids until the nausea and/or vomiting subsides. Call your physician if vomiting continues.  Special Instructions/Symptoms: Your throat may feel dry or sore from the anesthesia or the breathing tube placed in your throat during surgery.  If this causes discomfort, gargle with warm salt water. The discomfort should disappear within 24 hours.  Marland Kitchen

## 2012-04-20 NOTE — Progress Notes (Addendum)
Dr Mena Goes notified of pt's sx's N/V w oxycodone & reports she can tolerate hydrocodone. New order for hydrocodone 5/325 po & he would call scripts to pt's pharmacy. Oxycodone D/C'd.

## 2012-04-20 NOTE — Anesthesia Procedure Notes (Signed)
Procedure Name: LMA Insertion Date/Time: 04/20/2012 12:38 PM Performed by: Norva Pavlov Pre-anesthesia Checklist: Patient identified, Emergency Drugs available, Suction available and Patient being monitored Patient Re-evaluated:Patient Re-evaluated prior to inductionOxygen Delivery Method: Circle System Utilized Preoxygenation: Pre-oxygenation with 100% oxygen Intubation Type: IV induction Ventilation: Mask ventilation without difficulty LMA: LMA inserted LMA Size: 3.0 Number of attempts: 1 Airway Equipment and Method: bite block Placement Confirmation: positive ETCO2 Tube secured with: Tape Dental Injury: Teeth and Oropharynx as per pre-operative assessment

## 2012-04-20 NOTE — Transfer of Care (Signed)
Immediate Anesthesia Transfer of Care Note  Patient: [name]  Procedure(s) Performed: Procedure(s) (LRB): CYSTOSCOPY WITH URETEROSCOPY (N/A)  Patient Location: PACU  Anesthesia Type: General  Level of Consciousness: sleepy   Airway & Oxygen Therapy: Patient Spontanous Breathing, oral airway in place, and Patient connected to face mask oxygen  Post-op Assessment: Report given to PACU RN and Post -op Vital signs reviewed and stable  Post vital signs: Reviewed and stable  Complications: No apparent anesthesia complications

## 2012-04-20 NOTE — Brief Op Note (Signed)
04/20/2012  1:01 PM  PATIENT:  [name]  26 y.o. female  PRE-OPERATIVE DIAGNOSIS:  RIGHT URETERAL STONE  POST-OPERATIVE DIAGNOSIS:  RIGHT URETERAL STONE  PROCEDURE:  Procedure(s) (LRB): CYSTOSCOPY WITH RIGHT URETEROSCOPY STONE basket extraction (N/A)  SURGEON:  Surgeon(s) and Role:    * Antony Haste, MD - Primary   ASSISTANTS: none   ANESTHESIA:   general  EBL:  Total I/O In: 100 [I.V.:100] Out: -   BLOOD ADMINISTERED:none  DRAINS: none   LOCAL MEDICATIONS USED:  OTHER B. And O. suppository   SPECIMEN:  Stone  DISPOSITION OF SPECIMEN:  Lab  COUNTS:  YES  DICTATION: [id number]  PLAN OF CARE: Discharge to home after PACU  PATIENT DISPOSITION:  PACU - hemodynamically stable.   Delay start of Pharmacological VTE agent (>24hrs) due to surgical blood loss or risk of bleeding: not applicable

## 2012-04-20 NOTE — Anesthesia Preprocedure Evaluation (Addendum)
Anesthesia Evaluation  Patient identified by MRN, date of birth, ID band Patient awake    Reviewed: Allergy & Precautions, H&P , NPO status , Patient's Chart, lab work & pertinent test results  Airway Mallampati: II TM Distance: >3 FB Neck ROM: Full    Dental  (+) Teeth Intact Braces:   Pulmonary neg pulmonary ROS, Current Smoker,  breath sounds clear to auscultation  Pulmonary exam normal       Cardiovascular negative cardio ROS  Rhythm:Regular Rate:Normal     Neuro/Psych PSYCHIATRIC DISORDERS Depression negative neurological ROS     GI/Hepatic GERD-  Medicated,(+)     substance abuse  alcohol use,   Endo/Other  negative endocrine ROS  Renal/GU negative Renal ROS  negative genitourinary   Musculoskeletal negative musculoskeletal ROS (+)   Abdominal Normal abdominal exam  (+) + scaphoid   Peds  Hematology negative hematology ROS (+)   Anesthesia Other Findings   Reproductive/Obstetrics negative OB ROS                          Anesthesia Physical Anesthesia Plan  ASA: I  Anesthesia Plan: General   Post-op Pain Management:    Induction:   Airway Management Planned: LMA  Additional Equipment:   Intra-op Plan:   Post-operative Plan: Extubation in OR  Informed Consent: I have reviewed the patients History and Physical, chart, labs and discussed the procedure including the risks, benefits and alternatives for the proposed anesthesia with the patient or authorized representative who has indicated his/her understanding and acceptance.   Dental advisory given  Plan Discussed with: CRNA  Anesthesia Plan Comments:         Anesthesia Quick Evaluation

## 2012-04-21 ENCOUNTER — Encounter (HOSPITAL_BASED_OUTPATIENT_CLINIC_OR_DEPARTMENT_OTHER): Payer: Self-pay | Admitting: Urology

## 2012-04-21 NOTE — Anesthesia Postprocedure Evaluation (Signed)
Anesthesia Post Note  Patient: [name]  Procedure(s) Performed: Procedure(s) (LRB): CYSTOSCOPY WITH URETEROSCOPY (N/A)  Anesthesia type: General  Patient location: PACU  Post pain: Pain level controlled  Post assessment: Post-op Vital signs reviewed  Last Vitals:  Filed Vitals:   04/20/12 1445  BP: 104/60  Pulse: 75  Temp: 37 C  Resp: 16    Post vital signs: Reviewed  Level of consciousness: sedated  Complications: No apparent anesthesia complications

## 2012-04-22 NOTE — Op Note (Signed)
[name], [name]             ACCOUNT NO.:  [id number]  MEDICAL RECORD NO.:  [id number]  LOCATION:                                FACILITY:  [facility]  PHYSICIAN:  Jerilee Field, MD   DATE OF BIRTH:  1986/04/04  DATE OF PROCEDURE:  04/20/2012 DATE OF DISCHARGE:                              OPERATIVE REPORT   PREOPERATIVE DIAGNOSES: 1. Right ureteral stone. 2. Right hydronephrosis.  POSTOPERATIVE DIAGNOSES: 1. Right ureteral stone. 2. Right hydronephrosis.  PROCEDURE:  Cystoscopy, right ureteroscopy with stone basket extraction.  SURGEON:  Jerilee Field, MD  TYPE OF ANESTHESIA:  General.  INDICATION FOR PROCEDURE:  [name] is a 26 year old white female. She presented with severe nausea and vomiting from a right proximal ureteral stone.  She underwent cystoscopy with right ureteral stent placement and presents now for definitive stone management.  She had stent pain last week and went back to the ER where a CT scan showed the stone adjacent to the stent in the right proximal ureter.  Discussed again with her the nature, risks, and benefits of cystoscopy with right ureteroscopy, possible laser lithotripsy, possible stent placement.  All questions were answered and she elected to proceed.  FINDINGS:  On fluoroscopy, the stent was visualized in the right ureter in a normal location with a possible faint calcification seen in the proximal ureter.  On ureteroscopy, the stone was confirmed in this location and a photo was taken.  It was able to be grasped in a Gemini basket and removed intact and will be sent for analysis.  The ureter was inspected after stone removal all the way up to the right renal pelvis and noted to be normal without injury, stricture, tumor, or other stone. The ureter was widely patent and therefore, no new ureteral stent was placed.  DESCRIPTION OF PROCEDURE:  After consent was obtained, patient was brought to the operating room.  A time-out was  performed to confirm the patient and procedure.  After adequate anesthesia, she was placed in lithotomy position and prepped and draped in usual fashion.  A cystoscope was passed per urethra and the right ureteral stent was visualized.  It was grasped with graspers and pulled through the urethral meatus and a Sensor wire was advanced up the wire and coiled in the right kidney.  The stent was then removed.  With the wire in place, the bladder was drained.  Then, a semi-rigid ureteroscope was advanced adjacent to the wire into the right ureter.  The ureter was nicely dilated and I was able to easily get the scope into proximal ureter where the stone was visualized.  It was snared with a Gemini basket and removed intact without difficulty.  I then inspected the ureter all the way to the right ureteropelvic junction and again, it was noted to be normal and widely patent.  Therefore, the scope was carefully removed with inspecting the ureter on the way out and then the wire was removed. The patient's bladder was drained.  She was awakened and taken to recovery room in stable condition.  COMPLICATIONS:  None.  BLOOD LOSS:  Minimal.  DRAINS:  None.  SPECIMEN:  Stone, right ureter.  DISPOSITION:  Lab.  PATIENT DISPOSITION:  Patient stable to PACU.          ______________________________ Jerilee Field, MD     ME/MEDQ  D:  04/20/2012  T:  04/21/2012  Job:  [id number]

## 2012-04-26 ENCOUNTER — Encounter (HOSPITAL_BASED_OUTPATIENT_CLINIC_OR_DEPARTMENT_OTHER): Payer: Self-pay

## 2012-05-06 ENCOUNTER — Encounter: Payer: Self-pay | Admitting: Internal Medicine

## 2012-05-10 ENCOUNTER — Ambulatory Visit: Payer: 59 | Admitting: Internal Medicine

## 2013-02-03 IMAGING — CT CT ABD-PELV W/ CM
1 of 2 series · 15 of 32 positions shown, 19 images · IV contrast (80 ML OMNI 300)
Comparison: 03/30/2012

CLINICAL DATA: Rectal bleeding.  Right lower quadrant pain.  White
cell count 6.7.  Red cells and white cells in the urine.

CT ABDOMEN AND PELVIS WITH CONTRAST
TECHNIQUE: Multidetector CT imaging of the abdomen and pelvis was
performed following the standard protocol during bolus
administration of intravenous contrast.
Contrast: 80mL OMNIPAQUE IOHEXOL 300 MG/ML  SOLN

[Series 2: abd/pel with · axial · 0.59mm/px · z∈[+1036,+1381]mm · 15 of 77 slices shown, 19 images]
[im 4/77  soft-tissue]
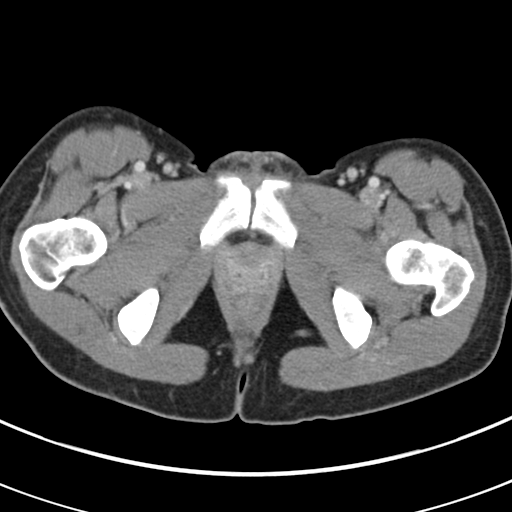
[im 4/77  bone]
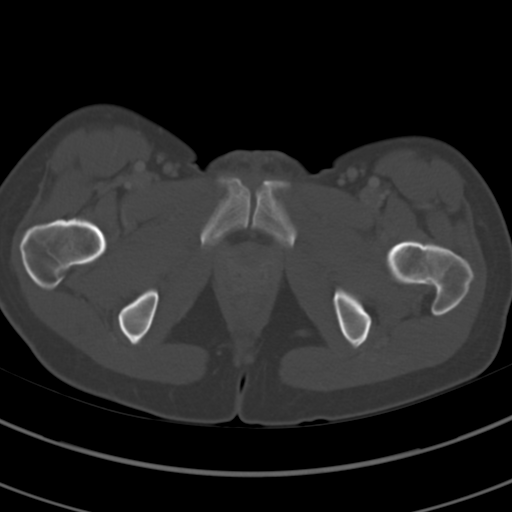
[im 10/77  soft-tissue]
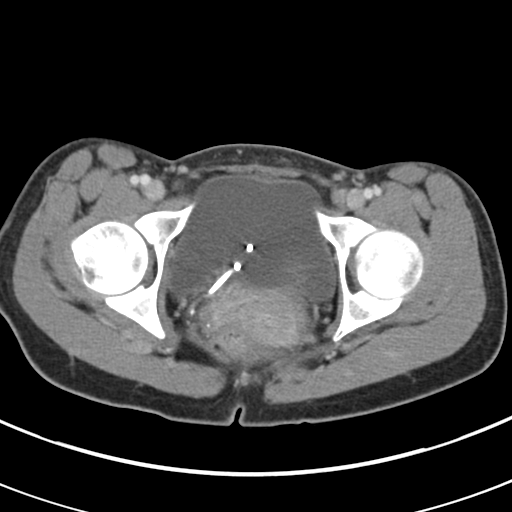
[im 16/77  soft-tissue]
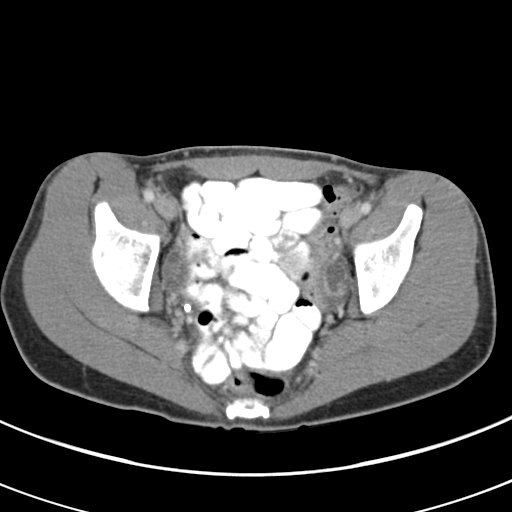
[im 22/77  soft-tissue]
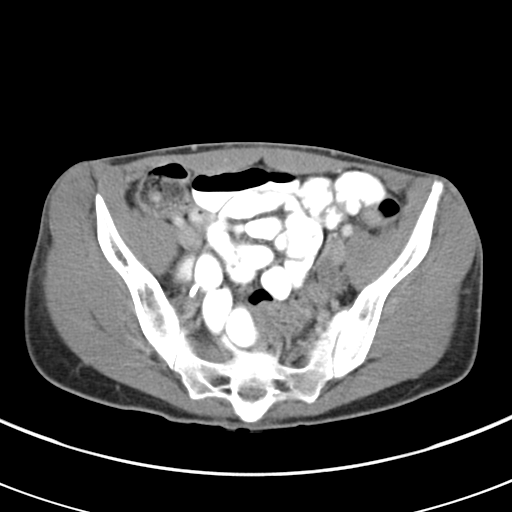
[im 28/77  soft-tissue]
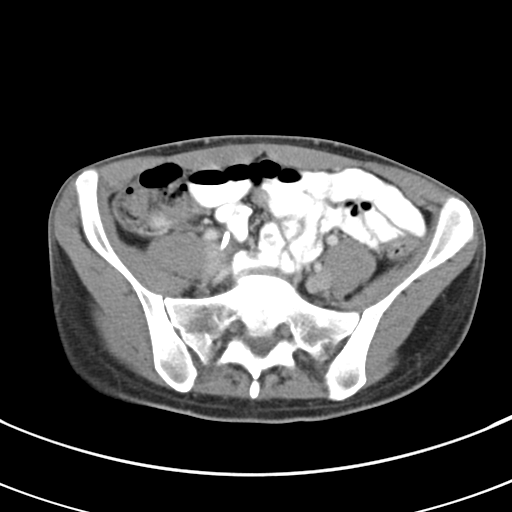
[im 34/77  soft-tissue]
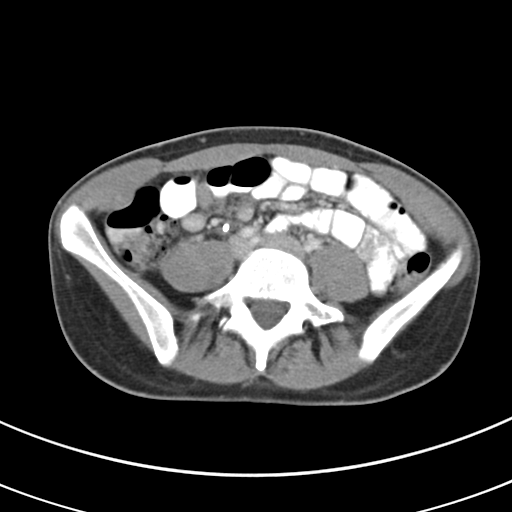
[im 40/77  soft-tissue]
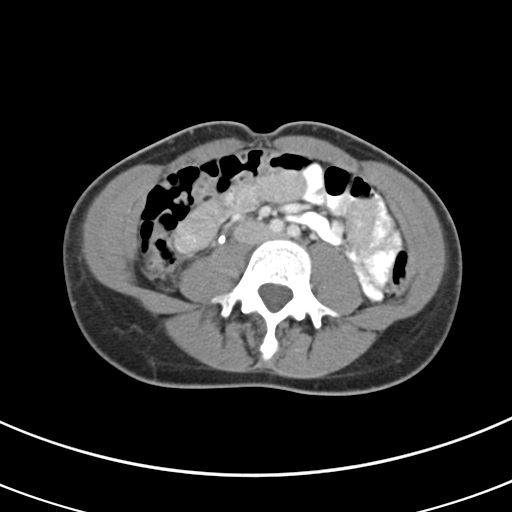
[im 43/77  soft-tissue]
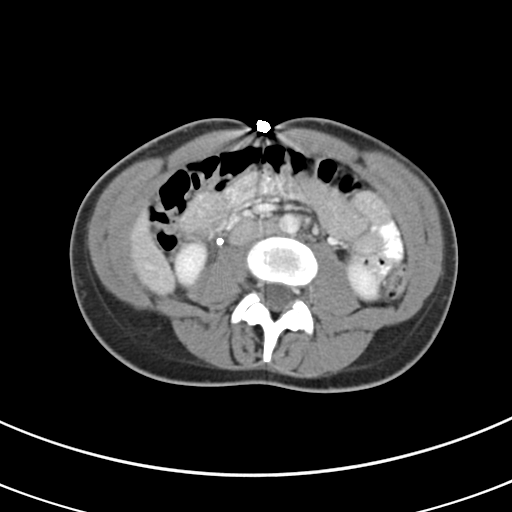
[im 49/77  soft-tissue]
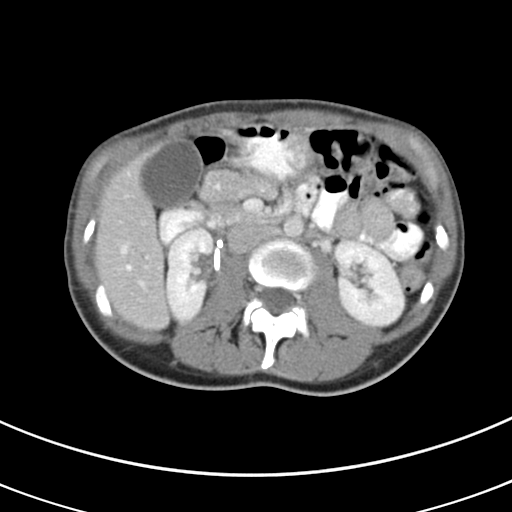
[im 49/77  bone]
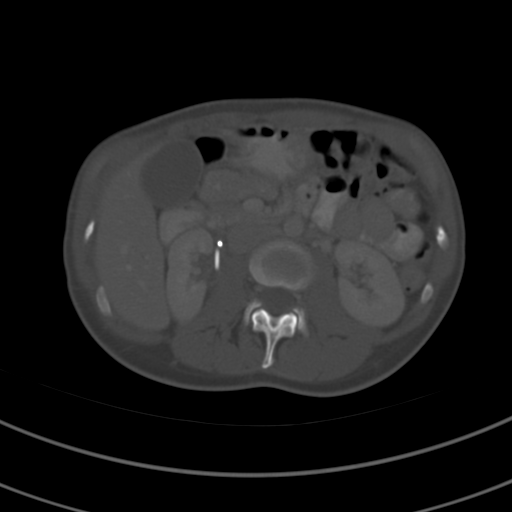
[im 55/77  soft-tissue]
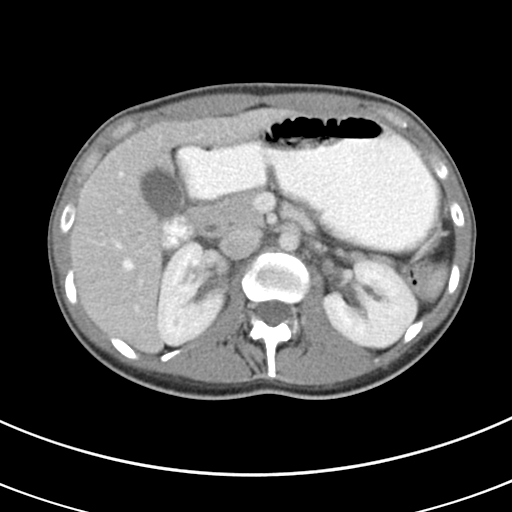
[im 61/77  soft-tissue]
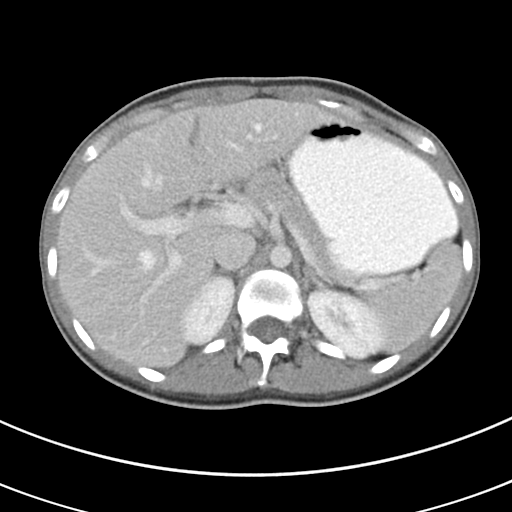
[im 64/77  lung]
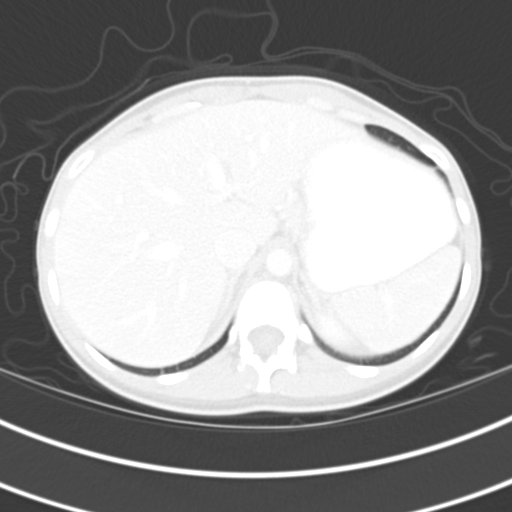
[im 67/77  soft-tissue]
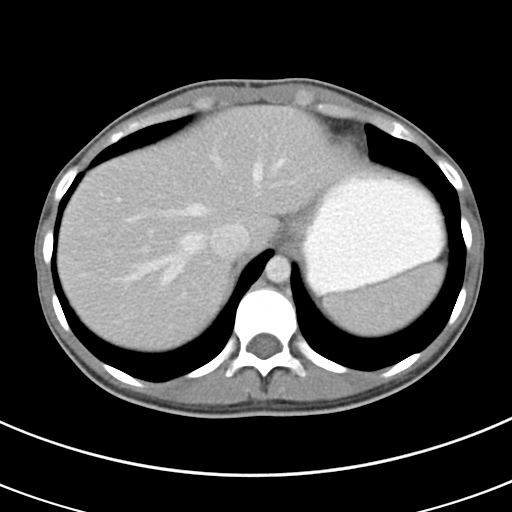
[im 67/77  lung]
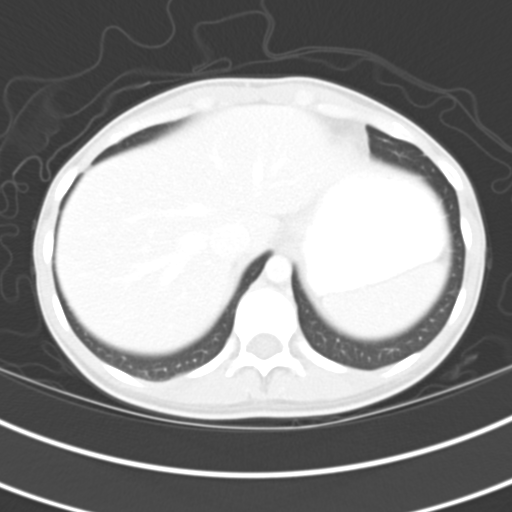
[im 70/77  lung]
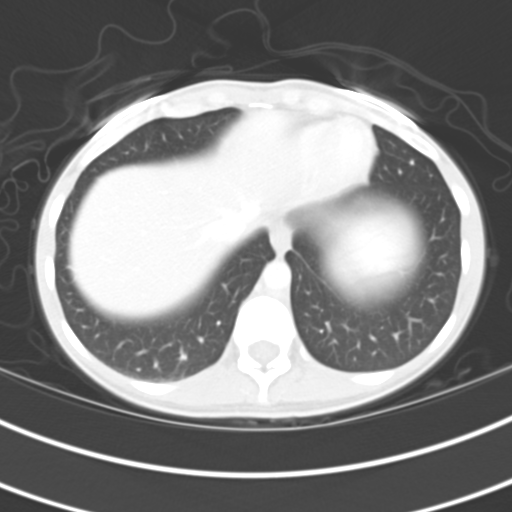
[im 73/77  soft-tissue]
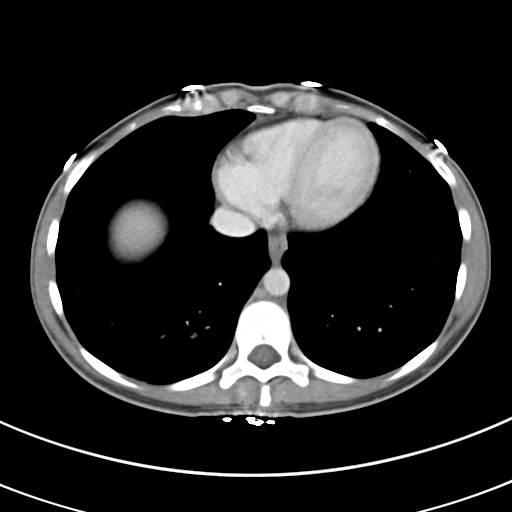
[im 73/77  lung]
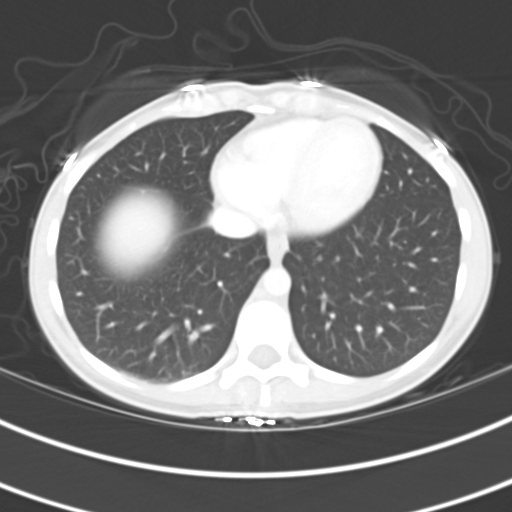

[15 of 32 positions shown; findings below may reference images not displayed]

FINDINGS: The lung bases are clear.  The liver, spleen,
gallbladder, pancreas, adrenal glands, abdominal aorta, and
retroperitoneal lymph nodes are unremarkable.  Portal and
mesenteric vessels appear patent.  Since the previous study, there
has been interval placement of a right ureteral stent with proximal
pigtail in the right renal pelvis and distal pigtail in the
bladder.  The right ureteral stones seen previously is not
visualized today.  There is suggestion of enhancement of the wall
of the renal pelvis which could be due to infection or inflammatory
process.  Small sub centimeter parenchymal cyst in the right
kidney.  Small stones in the lower pole left kidney without
obstruction.  Largest of these measures about 4 mm diameter.

The stomach is unremarkable.  Small bowel are not distended.  Stool
in the colon without distension or wall thickening.  No free air or
free fluid in the abdomen.

Pelvis:  The appendix is normal.  No bladder wall thickening.
Uterus and adnexal structures are not enlarged and appears stable.
No free or loculated pelvic fluid collections.  No evidence of
diverticulitis.  Normal alignment of the lumbar vertebrae.
IMPRESSION: Interval placement of right ureteral stent without residual
hydronephrosis.  There is some enhancement of the wall of the right
renal pelvis which may represent inflammatory or infectious
process.  Nonobstructing stones in the lower pole left kidney.

## 2013-06-07 ENCOUNTER — Ambulatory Visit: Payer: Self-pay

## 2013-06-07 LAB — CBC WITH DIFFERENTIAL/PLATELET
Basophil #: 0.1 10*3/uL (ref 0.0–0.1)
Eosinophil #: 0.3 10*3/uL (ref 0.0–0.7)
Eosinophil %: 4.1 %
HCT: 38.9 % (ref 35.0–47.0)
HGB: 12.9 g/dL (ref 12.0–16.0)
Lymphocyte #: 2.7 10*3/uL (ref 1.0–3.6)
Lymphocyte %: 38.1 %
MCH: 32.3 pg (ref 26.0–34.0)
MCV: 97 fL (ref 80–100)
Neutrophil %: 49.1 %

## 2013-08-27 ENCOUNTER — Emergency Department (HOSPITAL_COMMUNITY)
Admission: EM | Admit: 2013-08-27 | Discharge: 2013-08-27 | Discharge status: Against Medical Advice | Payer: 59 | Attending: Emergency Medicine | Admitting: Emergency Medicine

## 2013-08-27 DIAGNOSIS — Z008 Encounter for other general examination: Secondary | ICD-10-CM | POA: Insufficient documentation

## 2013-08-27 DIAGNOSIS — F17200 Nicotine dependence, unspecified, uncomplicated: Secondary | ICD-10-CM | POA: Insufficient documentation

## 2013-08-27 NOTE — [facility] Counselor (Signed)
Pt had been assessed and accepted by Maryjean Morn, PA to [facility], Dr. Larae Grooms [id number]. Pt left [facility] riding with security and Denice Bors, AADC 08/27/2013 2:11 AM

## 2013-08-27 NOTE — ED Notes (Signed)
Lawanda,RN notified pt has left the building. See NT note for explanation.

## 2013-08-27 NOTE — ED Notes (Signed)
Pt upset in triage. Pt states the nurse that brought her over from [facility]  Announced loudly at the triage window she was here for psych. Pt states she was embarrassed and   left due to this

## 2014-03-24 ENCOUNTER — Encounter (HOSPITAL_COMMUNITY): Payer: Self-pay | Admitting: Emergency Medicine

## 2014-03-24 ENCOUNTER — Emergency Department (HOSPITAL_COMMUNITY)
Admission: EM | Admit: 2014-03-24 | Discharge: 2014-03-25 | Disposition: A | Discharge status: Home / Self Care | Payer: Self-pay | Attending: Emergency Medicine | Admitting: Emergency Medicine

## 2014-03-24 DIAGNOSIS — Z3202 Encounter for pregnancy test, result negative: Secondary | ICD-10-CM | POA: Insufficient documentation

## 2014-03-24 DIAGNOSIS — K2970 Gastritis, unspecified, without bleeding: Secondary | ICD-10-CM | POA: Insufficient documentation

## 2014-03-24 DIAGNOSIS — Z79899 Other long term (current) drug therapy: Secondary | ICD-10-CM | POA: Insufficient documentation

## 2014-03-24 DIAGNOSIS — Z8739 Personal history of other diseases of the musculoskeletal system and connective tissue: Secondary | ICD-10-CM | POA: Insufficient documentation

## 2014-03-24 DIAGNOSIS — E860 Dehydration: Secondary | ICD-10-CM | POA: Insufficient documentation

## 2014-03-24 DIAGNOSIS — Z87440 Personal history of urinary (tract) infections: Secondary | ICD-10-CM | POA: Insufficient documentation

## 2014-03-24 DIAGNOSIS — R1110 Vomiting, unspecified: Secondary | ICD-10-CM

## 2014-03-24 DIAGNOSIS — K2990 Gastroduodenitis, unspecified, without bleeding: Principal | ICD-10-CM

## 2014-03-24 DIAGNOSIS — Z87448 Personal history of other diseases of urinary system: Secondary | ICD-10-CM | POA: Insufficient documentation

## 2014-03-24 DIAGNOSIS — F411 Generalized anxiety disorder: Secondary | ICD-10-CM | POA: Insufficient documentation

## 2014-03-24 DIAGNOSIS — Z87442 Personal history of urinary calculi: Secondary | ICD-10-CM | POA: Insufficient documentation

## 2014-03-24 DIAGNOSIS — Z8742 Personal history of other diseases of the female genital tract: Secondary | ICD-10-CM | POA: Insufficient documentation

## 2014-03-24 DIAGNOSIS — K219 Gastro-esophageal reflux disease without esophagitis: Secondary | ICD-10-CM | POA: Insufficient documentation

## 2014-03-24 DIAGNOSIS — F17200 Nicotine dependence, unspecified, uncomplicated: Secondary | ICD-10-CM | POA: Insufficient documentation

## 2014-03-24 MED ORDER — SODIUM CHLORIDE 0.9 % IV SOLN
1000.0000 mL | Freq: Once | INTRAVENOUS | Status: AC
  Administered 2014-03-24: 1000 mL via INTRAVENOUS

## 2014-03-24 MED ORDER — PANTOPRAZOLE SODIUM 40 MG IV SOLR
40.0000 mg | Freq: Once | INTRAVENOUS | Status: AC
  Administered 2014-03-24: 40 mg via INTRAVENOUS
  Filled 2014-03-24: qty 40

## 2014-03-24 MED ORDER — ONDANSETRON HCL 4 MG/2ML IJ SOLN
4.0000 mg | Freq: Once | INTRAMUSCULAR | Status: AC
  Administered 2014-03-24: 4 mg via INTRAVENOUS
  Filled 2014-03-24: qty 2

## 2014-03-24 MED ORDER — SODIUM CHLORIDE 0.9 % IV BOLUS (SEPSIS)
1000.0000 mL | Freq: Once | INTRAVENOUS | Status: AC
  Administered 2014-03-25: 1000 mL via INTRAVENOUS

## 2014-03-24 NOTE — ED Notes (Signed)
Pt presents with abdominal pain and is actively vomiting, c/o of same for 3 hours. Family states she has not eaten in 2 days, but has had water.

## 2014-03-24 NOTE — ED Provider Notes (Signed)
CSN: [id number]     Arrival date & time 03/24/14  2315 History   First MD Initiated Contact with Patient 03/24/14 2331     Chief Complaint  Patient presents with  . Emesis     (Consider location/radiation/quality/duration/timing/severity/associated sxs/prior Treatment) HPI Patient states she's been under increased stress lately and has not eaten for the past 2 days. She states she's not eat because she is not hungry. She began to have upper abdominal pain and multiple episodes of vomiting starting roughly 3 hours ago while lying down. She says states this was not associated with food. Vomitus is nonbilious nonbloody. She's had no fevers or chills. She denies any chest pain. Denies any recent use of NSAIDs. She's had previous episodes of similar types of symptoms in the past for it she is unsure of the diagnosis. Past Medical History  Diagnosis Date  . Endometriosis   . Lumbar facet arthropathy   . UTI (lower urinary tract infection)   . Right ureteral stone   . History of suicide attempt WRIST CUTTINE    PT STATES LAST TIME AGE 37--  NO ATTEMPTS SINCE--  RECEIVED COUNSELING  . History of alcohol abuse AS TEEN  . OA (osteoarthritis of spine) LUMBAR --  PER PT  . History of major depression AGE 37-  SUICIDE ATTEMPT    PT STATES NO PROBLEMS SINCE  . CIN III (cervical intraepithelial neoplasia III)   . Acid reflux OCCASIONAL  . Urgency of urination   . Frequency of urination   . Nocturia   . Hematuria   . History of kidney stones    Past Surgical History  Procedure Laterality Date  . Cervical biopsy  w/ loop electrode excision  FEB  2013    CIN III  (GYN OFFICE)  . Wisdom tooth extraction  2012    ORAL SURGEON OFFICE  . Orif right little finger fx  2006  . Cystoscopy with ureteroscopy  04/20/2012    Procedure: CYSTOSCOPY WITH URETEROSCOPY;  Surgeon: Antony Haste, MD;  Location: [facility];  Service: Urology;  Laterality: N/A;  CYSTO, RIGHT URETEROSCOPY ,   STONE BASKET EXTRACTION   CARM LASER     Family History  Problem Relation Age of Onset  . Cancer Mother   . Heart failure Father    History  Substance Use Topics  . Smoking status: Current Every Day Smoker -- 0.50 packs/day for 8 years    Types: Cigarettes  . Smokeless tobacco: Never Used  . Alcohol Use: Yes     Comment: Ocassionally   OB History   Grav Para Term Preterm Abortions TAB SAB Ect Mult Living                 Review of Systems  Constitutional: Negative for fever and chills.  Respiratory: Negative for cough and shortness of breath.   Cardiovascular: Negative for chest pain.  Gastrointestinal: Positive for nausea, vomiting and abdominal pain. Negative for diarrhea, constipation and blood in stool.  Genitourinary: Negative for dysuria and frequency.  Musculoskeletal: Negative for back pain, myalgias, neck pain and neck stiffness.  Skin: Negative for rash and wound.  Neurological: Negative for dizziness, weakness, light-headedness, numbness and headaches.  All other systems reviewed and are negative.      Allergies  Codeine  Home Medications   Current Outpatient Rx  Name  Route  Sig  Dispense  Refill  . diphenhydramine-acetaminophen (TYLENOL PM) 25-500 MG TABS   Oral   Take 1  tablet by mouth at bedtime as needed (sleep).         . lansoprazole (PREVACID) 30 MG capsule   Oral   Take 1 capsule (30 mg total) by mouth daily at 12 noon.   30 capsule   0   . ondansetron (ZOFRAN ODT) 4 MG disintegrating tablet      4mg  ODT q4 hours prn nausea/vomit   8 tablet   0    BP 119/72  Pulse 97  Temp(Src) 97.7 F (36.5 C) (Oral)  Resp 18  SpO2 96% Physical Exam  Nursing note and vitals reviewed. Constitutional: She is oriented to person, place, and time. She appears well-developed and well-nourished. No distress.  HENT:  Head: Normocephalic and atraumatic.  Mouth/Throat: Oropharynx is clear and moist.  Eyes: EOM are normal. Pupils are equal, round,  and reactive to light.  Neck: Normal range of motion. Neck supple.  Cardiovascular: Normal rate and regular rhythm.   Pulmonary/Chest: Effort normal and breath sounds normal. No respiratory distress. She has no wheezes. She has no rales. She exhibits no tenderness.  Abdominal: Soft. Bowel sounds are normal. She exhibits no distension and no mass. There is tenderness (tenderness to palpation in the epigastrium, right upper quadrant, left upper quadrant. No rebound or guarding.). There is no rebound and no guarding.  Musculoskeletal: Normal range of motion. She exhibits no edema and no tenderness.  Neurological: She is alert and oriented to person, place, and time.  Moves all extremities without deficit. Sensation grossly intact.  Skin: Skin is warm and dry. No rash noted. No erythema.  Psychiatric: Her behavior is normal.  Patient appears anxious and is dry heaving.    ED Course  Procedures (including critical care time) Labs Review Labs Reviewed  CBC WITH DIFFERENTIAL - Abnormal; Notable for the following:    WBC 17.3 (*)    Neutrophils Relative % 85 (*)    Neutro Abs 14.7 (*)    All other components within normal limits  COMPREHENSIVE METABOLIC PANEL - Abnormal; Notable for the following:    CO2 13 (*)    Albumin 5.4 (*)    AST 60 (*)    ALT 44 (*)    All other components within normal limits  URINALYSIS, ROUTINE W REFLEX MICROSCOPIC - Abnormal; Notable for the following:    Hgb urine dipstick MODERATE (*)    Ketones, ur >80 (*)    Protein, ur 30 (*)    All other components within normal limits  URINE MICROSCOPIC-ADD ON - Abnormal; Notable for the following:    Squamous Epithelial / LPF FEW (*)    All other components within normal limits  LIPASE, BLOOD  POC URINE PREG, ED   Imaging Review US Abdomen Complete  03/25/2014   CLINICAL DATA:  Abdominal pain, nausea and vomiting.  EXAM: ULTRASOUND ABDOMEN COMPLETE  COMPARISON:  CT of the abdomen and pelvis performed 04/15/2012,  and renal ultrasound performed 12/04/2006  FINDINGS: Gallbladder:  No gallstones or wall thickening visualized. A small 5 mm polyp is incidentally noted near the base of the gallbladder. No sonographic Murphy sign noted.  Common bile duct:  Diameter: 0.2 cm, within normal limits in caliber.  Liver:  No focal lesion identified. Within normal limits in parenchymal echogenicity.  IVC:  No abnormality visualized.  Pancreas:  Visualized portion unremarkable.  Spleen:  Size and appearance within normal limits.  Right Kidney:  Length: 10.5 cm. Echogenicity within normal limits. No mass or hydronephrosis visualized. A 5 mm stone  is noted at the interpole region of the right kidney.  Left Kidney:  Length: 10.5 cm. Echogenicity within normal limits. No mass or hydronephrosis visualized.  Abdominal aorta:  No aneurysm visualized.  Other findings:  None.  IMPRESSION: 1. No acute abnormality seen in the abdomen. 2. 5 mm polyp noted near the base of the gallbladder; gallbladder otherwise unremarkable in appearance. 3. 5 mm nonobstructing stone noted at the interpole region of the right kidney.   Electronically Signed   By: Roanna RaiderJeffery  Chang M.D.   On: 03/25/2014 01:17     EKG Interpretation None      MDM   Final diagnoses:  Gastritis  Vomiting  Dehydration   Patient's symptoms have improved. She's had no more vomiting in the emergency department. Labs consistent with dehydration. She is very mild elevation in her LFTs. She has an elevated white blood cell count. She's been given 3 L of IV fluids and her heart rate has improved. Ultrasound without acute findings. I believe that the vomiting and epigastric pain is likely due to gastritis possibly from alcohol use. There is an component of anxiety/stress that is exacerbating the patient's symptoms. I advised the patient against NSAIDs, alcohol use and certain foods. We'll start the patient on daily PPI. She will need to followup with gastroenterology should her symptoms  persist.   Loren Raceravid Judy Pollman, MD 03/25/14 [phone]

## 2014-03-25 ENCOUNTER — Emergency Department (HOSPITAL_COMMUNITY): Payer: 59

## 2014-03-25 LAB — CBC WITH DIFFERENTIAL/PLATELET
BASOS ABS: 0 10*3/uL (ref 0.0–0.1)
BASOS PCT: 0 % (ref 0–1)
Eosinophils Absolute: 0 10*3/uL (ref 0.0–0.7)
Eosinophils Relative: 0 % (ref 0–5)
HEMATOCRIT: 42.9 % (ref 36.0–46.0)
HEMOGLOBIN: 14.7 g/dL (ref 12.0–15.0)
LYMPHS ABS: 2.1 10*3/uL (ref 0.7–4.0)
LYMPHS PCT: 12 % (ref 12–46)
MCH: 33 pg (ref 26.0–34.0)
MCHC: 34.3 g/dL (ref 30.0–36.0)
MCV: 96.4 fL (ref 78.0–100.0)
MONOS PCT: 3 % (ref 3–12)
Monocytes Absolute: 0.5 10*3/uL (ref 0.1–1.0)
NEUTROS ABS: 14.7 10*3/uL — AB (ref 1.7–7.7)
Neutrophils Relative %: 85 % — ABNORMAL HIGH (ref 43–77)
Platelets: 347 10*3/uL (ref 150–400)
RBC: 4.45 MIL/uL (ref 3.87–5.11)
RDW: 13.5 % (ref 11.5–15.5)
WBC: 17.3 10*3/uL — AB (ref 4.0–10.5)

## 2014-03-25 LAB — COMPREHENSIVE METABOLIC PANEL
ALBUMIN: 5.4 g/dL — AB (ref 3.5–5.2)
ALT: 44 U/L — AB (ref 0–35)
AST: 60 U/L — AB (ref 0–37)
Alkaline Phosphatase: 65 U/L (ref 39–117)
BUN: 14 mg/dL (ref 6–23)
CHLORIDE: 98 meq/L (ref 96–112)
CO2: 13 meq/L — AB (ref 19–32)
CREATININE: 0.63 mg/dL (ref 0.50–1.10)
Calcium: 9.8 mg/dL (ref 8.4–10.5)
GFR calc Af Amer: >90 mL/min (ref >90)
GFR calc non Af Amer: >90 mL/min (ref >90)
GLUCOSE: 92 mg/dL (ref 70–99)
POTASSIUM: 4 meq/L (ref 3.7–5.3)
Sodium: 141 mEq/L (ref 137–147)
TOTAL PROTEIN: 8.2 g/dL (ref 6.0–8.3)
Total Bilirubin: 0.8 mg/dL (ref 0.3–1.2)

## 2014-03-25 LAB — URINALYSIS, ROUTINE W REFLEX MICROSCOPIC
Bilirubin Urine: NEGATIVE
GLUCOSE, UA: NEGATIVE mg/dL
Leukocytes, UA: NEGATIVE
Nitrite: NEGATIVE
PH: 5.5 (ref 5.0–8.0)
PROTEIN: 30 mg/dL — AB
Specific Gravity, Urine: 1.023 (ref 1.005–1.030)
Urobilinogen, UA: 0.2 mg/dL (ref 0.0–1.0)

## 2014-03-25 LAB — URINE MICROSCOPIC-ADD ON

## 2014-03-25 LAB — POC URINE PREG, ED: Preg Test, Ur: NEGATIVE

## 2014-03-25 LAB — LIPASE, BLOOD: Lipase: 17 U/L (ref 11–59)

## 2014-03-25 MED ORDER — ONDANSETRON 8 MG PO TBDP
8.0000 mg | ORAL_TABLET | Freq: Once | ORAL | Status: AC
  Administered 2014-03-25: 8 mg via ORAL
  Filled 2014-03-25: qty 1

## 2014-03-25 MED ORDER — LANSOPRAZOLE 30 MG PO CPDR: 30.0000 mg | DELAYED_RELEASE_CAPSULE | Freq: Every day | ORAL | Status: DC

## 2014-03-25 MED ORDER — GI COCKTAIL ~~LOC~~
30.0000 mL | Freq: Once | ORAL | Status: AC
  Administered 2014-03-25: 30 mL via ORAL
  Filled 2014-03-25: qty 30

## 2014-03-25 MED ORDER — ONDANSETRON 4 MG PO TBDP: ORAL_TABLET | ORAL | Status: DC

## 2014-03-25 MED ORDER — SODIUM CHLORIDE 0.9 % IV BOLUS (SEPSIS)
1000.0000 mL | Freq: Once | INTRAVENOUS | Status: AC
Start: 2014-03-25 — End: 2014-03-25
  Administered 2014-03-25: 1000 mL via INTRAVENOUS

## 2014-03-25 MED ORDER — ONDANSETRON HCL 4 MG/2ML IJ SOLN
4.0000 mg | Freq: Once | INTRAMUSCULAR | Status: AC
  Administered 2014-03-25: 4 mg via INTRAVENOUS

## 2014-03-25 MED ORDER — LORAZEPAM 2 MG/ML IJ SOLN
1.0000 mg | Freq: Once | INTRAMUSCULAR | Status: AC
  Administered 2014-03-25: 1 mg via INTRAVENOUS
  Filled 2014-03-25: qty 1

## 2014-03-25 MED ORDER — ONDANSETRON HCL 4 MG/2ML IJ SOLN
INTRAMUSCULAR | Status: AC
  Filled 2014-03-25: qty 2

## 2014-03-25 NOTE — ED Notes (Signed)
Pt noted to be gagging when attempting to stand. MD made aware, verbal orders given

## 2014-03-25 NOTE — Discharge Instructions (Signed)
For persistent symptoms call and make an appointment to followup with the gastroenterologist. Return immediately to the emergency department for fever, worsening pain, persistent vomiting, blood in vomit or stool.  Gastritis, Adult Gastritis is soreness and swelling (inflammation) of the lining of the stomach. Gastritis can develop as a sudden onset (acute) or long-term (chronic) condition. If gastritis is not treated, it can lead to stomach bleeding and ulcers. CAUSES  Gastritis occurs when the stomach lining is weak or damaged. Digestive juices from the stomach then inflame the weakened stomach lining. The stomach lining may be weak or damaged due to viral or bacterial infections. One common bacterial infection is the Helicobacter pylori infection. Gastritis can also result from excessive alcohol consumption, taking certain medicines, or having too much acid in the stomach.  SYMPTOMS  In some cases, there are no symptoms. When symptoms are present, they may include:  Pain or a burning sensation in the upper abdomen.  Nausea.  Vomiting.  An uncomfortable feeling of fullness after eating. DIAGNOSIS  Your caregiver may suspect you have gastritis based on your symptoms and a physical exam. To determine the cause of your gastritis, your caregiver may perform the following:  Blood or stool tests to check for the H pylori bacterium.  Gastroscopy. A thin, flexible tube (endoscope) is passed down the esophagus and into the stomach. The endoscope has a light and camera on the end. Your caregiver uses the endoscope to view the inside of the stomach.  Taking a tissue sample (biopsy) from the stomach to examine under a microscope. TREATMENT  Depending on the cause of your gastritis, medicines may be prescribed. If you have a bacterial infection, such as an H pylori infection, antibiotics may be given. If your gastritis is caused by too much acid in the stomach, H2 blockers or antacids may be given.  Your caregiver may recommend that you stop taking aspirin, ibuprofen, or other nonsteroidal anti-inflammatory drugs (NSAIDs). HOME CARE INSTRUCTIONS  Only take over-the-counter or prescription medicines as directed by your caregiver.  If you were given antibiotic medicines, take them as directed. Finish them even if you start to feel better.  Drink enough fluids to keep your urine clear or pale yellow.  Avoid foods and drinks that make your symptoms worse, such as:  Caffeine or alcoholic drinks.  Chocolate.  Peppermint or mint flavorings.  Garlic and onions.  Spicy foods.  Citrus fruits, such as oranges, lemons, or limes.  Tomato-based foods such as sauce, chili, salsa, and pizza.  Fried and fatty foods.  Eat small, frequent meals instead of large meals. SEEK IMMEDIATE MEDICAL CARE IF:   You have black or dark red stools.  You vomit blood or material that looks like coffee grounds.  You are unable to keep fluids down.  Your abdominal pain gets worse.  You have a fever.  You do not feel better after 1 week.  You have any other questions or concerns. MAKE SURE YOU:  Understand these instructions.  Will watch your condition.  Will get help right away if you are not doing well or get worse. Document Released: 12/02/2001 Document Revised: 06/08/2012 Document Reviewed: 01/21/2012 [facility] Patient Information 2014 [place], [place].

## 2014-03-27 ENCOUNTER — Encounter (HOSPITAL_COMMUNITY): Payer: Self-pay | Admitting: Emergency Medicine

## 2014-03-27 ENCOUNTER — Emergency Department (HOSPITAL_COMMUNITY): Payer: Self-pay

## 2014-03-27 ENCOUNTER — Inpatient Hospital Stay (HOSPITAL_COMMUNITY)
Admission: EM | Admit: 2014-03-27 | Discharge: 2014-04-01 | DRG: 442 | Disposition: A | Discharge status: Home / Self Care | Payer: 59 | Attending: Internal Medicine | Admitting: Internal Medicine

## 2014-03-27 DIAGNOSIS — Z885 Allergy status to narcotic agent status: Secondary | ICD-10-CM

## 2014-03-27 DIAGNOSIS — F1021 Alcohol dependence, in remission: Secondary | ICD-10-CM

## 2014-03-27 DIAGNOSIS — E872 Acidosis, unspecified: Secondary | ICD-10-CM | POA: Diagnosis present

## 2014-03-27 DIAGNOSIS — Z79899 Other long term (current) drug therapy: Secondary | ICD-10-CM

## 2014-03-27 DIAGNOSIS — R7989 Other specified abnormal findings of blood chemistry: Secondary | ICD-10-CM | POA: Diagnosis present

## 2014-03-27 DIAGNOSIS — K219 Gastro-esophageal reflux disease without esophagitis: Secondary | ICD-10-CM

## 2014-03-27 DIAGNOSIS — R112 Nausea with vomiting, unspecified: Secondary | ICD-10-CM | POA: Diagnosis present

## 2014-03-27 DIAGNOSIS — F411 Generalized anxiety disorder: Secondary | ICD-10-CM | POA: Diagnosis present

## 2014-03-27 DIAGNOSIS — M479 Spondylosis, unspecified: Secondary | ICD-10-CM | POA: Diagnosis present

## 2014-03-27 DIAGNOSIS — K759 Inflammatory liver disease, unspecified: Secondary | ICD-10-CM

## 2014-03-27 DIAGNOSIS — K5900 Constipation, unspecified: Secondary | ICD-10-CM | POA: Diagnosis not present

## 2014-03-27 DIAGNOSIS — R740 Nonspecific elevation of levels of transaminase and lactic acid dehydrogenase [LDH]: Secondary | ICD-10-CM

## 2014-03-27 DIAGNOSIS — T391X1A Poisoning by 4-Aminophenol derivatives, accidental (unintentional), initial encounter: Secondary | ICD-10-CM | POA: Diagnosis present

## 2014-03-27 DIAGNOSIS — E860 Dehydration: Secondary | ICD-10-CM

## 2014-03-27 DIAGNOSIS — F1020 Alcohol dependence, uncomplicated: Secondary | ICD-10-CM

## 2014-03-27 DIAGNOSIS — R7402 Elevation of levels of lactic acid dehydrogenase (LDH): Secondary | ICD-10-CM | POA: Diagnosis present

## 2014-03-27 DIAGNOSIS — Z87442 Personal history of urinary calculi: Secondary | ICD-10-CM

## 2014-03-27 DIAGNOSIS — D689 Coagulation defect, unspecified: Secondary | ICD-10-CM | POA: Diagnosis present

## 2014-03-27 DIAGNOSIS — D6489 Other specified anemias: Secondary | ICD-10-CM | POA: Diagnosis present

## 2014-03-27 DIAGNOSIS — R945 Abnormal results of liver function studies: Secondary | ICD-10-CM

## 2014-03-27 DIAGNOSIS — N809 Endometriosis, unspecified: Secondary | ICD-10-CM | POA: Diagnosis present

## 2014-03-27 DIAGNOSIS — F329 Major depressive disorder, single episode, unspecified: Secondary | ICD-10-CM | POA: Diagnosis present

## 2014-03-27 DIAGNOSIS — R109 Unspecified abdominal pain: Secondary | ICD-10-CM | POA: Diagnosis present

## 2014-03-27 DIAGNOSIS — B179 Acute viral hepatitis, unspecified: Secondary | ICD-10-CM | POA: Diagnosis present

## 2014-03-27 DIAGNOSIS — Z8249 Family history of ischemic heart disease and other diseases of the circulatory system: Secondary | ICD-10-CM

## 2014-03-27 DIAGNOSIS — R7401 Elevation of levels of liver transaminase levels: Secondary | ICD-10-CM | POA: Diagnosis present

## 2014-03-27 DIAGNOSIS — R079 Chest pain, unspecified: Secondary | ICD-10-CM | POA: Diagnosis present

## 2014-03-27 DIAGNOSIS — F17200 Nicotine dependence, unspecified, uncomplicated: Secondary | ICD-10-CM | POA: Diagnosis present

## 2014-03-27 DIAGNOSIS — K7200 Acute and subacute hepatic failure without coma: Principal | ICD-10-CM | POA: Diagnosis present

## 2014-03-27 LAB — PROTIME-INR
INR: 1.67 — ABNORMAL HIGH (ref 0.00–1.49)
PROTHROMBIN TIME: 19.2 s — AB (ref 11.6–15.2)

## 2014-03-27 LAB — URINALYSIS, ROUTINE W REFLEX MICROSCOPIC
BILIRUBIN URINE: NEGATIVE
GLUCOSE, UA: NEGATIVE mg/dL
HGB URINE DIPSTICK: NEGATIVE
Ketones, ur: 15 mg/dL — AB
Leukocytes, UA: NEGATIVE
Nitrite: NEGATIVE
Protein, ur: NEGATIVE mg/dL
SPECIFIC GRAVITY, URINE: 1.026 (ref 1.005–1.030)
UROBILINOGEN UA: 1 mg/dL (ref 0.0–1.0)
pH: 5.5 (ref 5.0–8.0)

## 2014-03-27 LAB — CBC WITH DIFFERENTIAL/PLATELET
BASOS ABS: 0 10*3/uL (ref 0.0–0.1)
BASOS PCT: 0 % (ref 0–1)
Eosinophils Absolute: 0 10*3/uL (ref 0.0–0.7)
Eosinophils Relative: 0 % (ref 0–5)
HCT: 39.7 % (ref 36.0–46.0)
HEMOGLOBIN: 14.1 g/dL (ref 12.0–15.0)
LYMPHS PCT: 12 % (ref 12–46)
Lymphs Abs: 1.1 10*3/uL (ref 0.7–4.0)
MCH: 33 pg (ref 26.0–34.0)
MCHC: 35.5 g/dL (ref 30.0–36.0)
MCV: 93 fL (ref 78.0–100.0)
MONO ABS: 1 10*3/uL (ref 0.1–1.0)
Monocytes Relative: 11 % (ref 3–12)
NEUTROS ABS: 7.2 10*3/uL (ref 1.7–7.7)
NEUTROS PCT: 78 % — AB (ref 43–77)
Platelets: 180 10*3/uL (ref 150–400)
RBC: 4.27 MIL/uL (ref 3.87–5.11)
RDW: 12.6 % (ref 11.5–15.5)
WBC: 9.3 10*3/uL (ref 4.0–10.5)

## 2014-03-27 LAB — BLOOD GAS, ARTERIAL
Acid-base deficit: 6 mmol/L — ABNORMAL HIGH (ref 0.0–2.0)
Bicarbonate: 18.7 mEq/L — ABNORMAL LOW (ref 20.0–24.0)
Drawn by: 295031
FIO2: 0.21 %
O2 SAT: 97.8 %
PATIENT TEMPERATURE: 98.6
PH ART: 7.334 — AB (ref 7.350–7.450)
TCO2: 16.9 mmol/L (ref 0–100)
pCO2 arterial: 36.1 mmHg (ref 35.0–45.0)
pO2, Arterial: 103 mmHg — ABNORMAL HIGH (ref 80.0–100.0)

## 2014-03-27 LAB — COMPREHENSIVE METABOLIC PANEL
ALBUMIN: 4.9 g/dL (ref 3.5–5.2)
ALK PHOS: 63 U/L (ref 39–117)
ALT: 959 U/L — ABNORMAL HIGH (ref 0–35)
ALT: 1449 U/L — ABNORMAL HIGH (ref 0–35)
AST: 1236 U/L — ABNORMAL HIGH (ref 0–37)
AST: 1825 U/L — AB (ref 0–37)
Albumin: 4.1 g/dL (ref 3.5–5.2)
Alkaline Phosphatase: 59 U/L (ref 39–117)
BILIRUBIN TOTAL: 3.2 mg/dL — AB (ref 0.3–1.2)
BUN: 9 mg/dL (ref 6–23)
BUN: 6 mg/dL (ref 6–23)
CHLORIDE: 99 meq/L (ref 96–112)
CHLORIDE: 102 meq/L (ref 96–112)
CO2: 15 meq/L — AB (ref 19–32)
CO2: 22 mEq/L (ref 19–32)
CREATININE: 0.53 mg/dL (ref 0.50–1.10)
CREATININE: 0.39 mg/dL — AB (ref 0.50–1.10)
Calcium: 9.2 mg/dL (ref 8.4–10.5)
Calcium: 7.9 mg/dL — ABNORMAL LOW (ref 8.4–10.5)
GFR calc Af Amer: >90 mL/min (ref >90)
GFR calc Af Amer: >90 mL/min (ref >90)
GFR calc non Af Amer: >90 mL/min (ref >90)
Glucose, Bld: 147 mg/dL — ABNORMAL HIGH (ref 70–99)
Glucose, Bld: 135 mg/dL — ABNORMAL HIGH (ref 70–99)
POTASSIUM: 3.1 meq/L — AB (ref 3.7–5.3)
Potassium: 4.8 mEq/L (ref 3.7–5.3)
SODIUM: 136 meq/L — AB (ref 137–147)
Sodium: 136 mEq/L — ABNORMAL LOW (ref 137–147)
Total Bilirubin: 3.1 mg/dL — ABNORMAL HIGH (ref 0.3–1.2)
Total Protein: 7.5 g/dL (ref 6.0–8.3)
Total Protein: 6.4 g/dL (ref 6.0–8.3)

## 2014-03-27 LAB — PREGNANCY, URINE: Preg Test, Ur: NEGATIVE

## 2014-03-27 LAB — OSMOLALITY: Osmolality: 280 mOsm/kg (ref 275–300)

## 2014-03-27 LAB — RAPID URINE DRUG SCREEN, HOSP PERFORMED
Amphetamines: NOT DETECTED
BARBITURATES: POSITIVE — AB
BENZODIAZEPINES: NOT DETECTED
Cocaine: NOT DETECTED
Opiates: NOT DETECTED
TETRAHYDROCANNABINOL: POSITIVE — AB

## 2014-03-27 LAB — HEPATITIS PANEL, ACUTE
HCV Ab: NEGATIVE
HEP B C IGM: NONREACTIVE
HEP B S AG: NEGATIVE
Hep A IgM: NONREACTIVE

## 2014-03-27 LAB — MONONUCLEOSIS SCREEN: Mono Screen: NEGATIVE

## 2014-03-27 LAB — ACETAMINOPHEN LEVEL: Acetaminophen (Tylenol), Serum: <15 ug/mL (ref 10–30)

## 2014-03-27 LAB — LIPASE, BLOOD: Lipase: 24 U/L (ref 11–59)

## 2014-03-27 LAB — I-STAT TROPONIN, ED: TROPONIN I, POC: 0 ng/mL (ref 0.00–0.08)

## 2014-03-27 LAB — LACTATE DEHYDROGENASE: LDH: 841 U/L — ABNORMAL HIGH (ref 94–250)

## 2014-03-27 LAB — I-STAT CG4 LACTIC ACID, ED: LACTIC ACID, VENOUS: 0.73 mmol/L (ref 0.5–2.2)

## 2014-03-27 LAB — SALICYLATE LEVEL: Salicylate Lvl: <2 mg/dL — ABNORMAL LOW (ref 2.8–20.0)

## 2014-03-27 LAB — ETHANOL: Alcohol, Ethyl (B): <11 mg/dL (ref 0–11)

## 2014-03-27 LAB — CK: Total CK: 86 U/L (ref 7–177)

## 2014-03-27 MED ORDER — SODIUM CHLORIDE 0.9 % IV BOLUS (SEPSIS)
1000.0000 mL | Freq: Once | INTRAVENOUS | Status: AC
  Administered 2014-03-27: 1000 mL via INTRAVENOUS

## 2014-03-27 MED ORDER — MORPHINE SULFATE 4 MG/ML IJ SOLN
4.0000 mg | Freq: Once | INTRAMUSCULAR | Status: AC
  Administered 2014-03-27: 4 mg via INTRAVENOUS
  Filled 2014-03-27: qty 1

## 2014-03-27 MED ORDER — SODIUM CHLORIDE 0.9 % IV SOLN
INTRAVENOUS | Status: AC
  Administered 2014-03-27: 17:00:00 via INTRAVENOUS

## 2014-03-27 MED ORDER — HYDROMORPHONE HCL PF 1 MG/ML IJ SOLN: 1.0000 mg | INTRAMUSCULAR | Status: AC | PRN

## 2014-03-27 MED ORDER — ONDANSETRON HCL 4 MG/2ML IJ SOLN
4.0000 mg | Freq: Once | INTRAMUSCULAR | Status: AC
  Administered 2014-03-27: 4 mg via INTRAVENOUS
  Filled 2014-03-27: qty 2

## 2014-03-27 MED ORDER — KETOROLAC TROMETHAMINE 30 MG/ML IJ SOLN
30.0000 mg | Freq: Once | INTRAMUSCULAR | Status: AC
  Administered 2014-03-27: 30 mg via INTRAVENOUS
  Filled 2014-03-27: qty 1

## 2014-03-27 MED ORDER — ONDANSETRON 8 MG PO TBDP
4.0000 mg | ORAL_TABLET | ORAL | Status: DC | PRN
  Administered 2014-03-28 – 2014-03-30 (×5): 4 mg via ORAL
  Filled 2014-03-27 (×6): qty 1

## 2014-03-27 MED ORDER — ONDANSETRON HCL 4 MG/2ML IJ SOLN: 4.0000 mg | Freq: Three times a day (TID) | INTRAMUSCULAR | Status: AC | PRN

## 2014-03-27 MED ORDER — LORAZEPAM 2 MG/ML IJ SOLN
1.0000 mg | Freq: Once | INTRAMUSCULAR | Status: AC
  Administered 2014-03-27: 1 mg via INTRAVENOUS
  Filled 2014-03-27: qty 1

## 2014-03-27 MED ORDER — POTASSIUM CHLORIDE CRYS ER 20 MEQ PO TBCR
40.0000 meq | EXTENDED_RELEASE_TABLET | Freq: Once | ORAL | Status: DC
  Filled 2014-03-27: qty 2

## 2014-03-27 MED ORDER — PANTOPRAZOLE SODIUM 20 MG PO TBEC
20.0000 mg | DELAYED_RELEASE_TABLET | Freq: Every day | ORAL | Status: DC
  Administered 2014-03-28 – 2014-04-01 (×5): 20 mg via ORAL
  Filled 2014-03-27 (×6): qty 1

## 2014-03-27 MED ORDER — POTASSIUM CHLORIDE CRYS ER 20 MEQ PO TBCR
40.0000 meq | EXTENDED_RELEASE_TABLET | Freq: Once | ORAL | Status: AC
  Administered 2014-03-27: 40 meq via ORAL
  Filled 2014-03-27: qty 2

## 2014-03-27 MED ORDER — DEXTROSE 5 % IV SOLN
15.0000 mg/kg/h | INTRAVENOUS | Status: DC
  Administered 2014-03-27: 15 mg/kg/h via INTRAVENOUS
  Filled 2014-03-27: qty 150

## 2014-03-27 MED ORDER — ACETYLCYSTEINE LOAD VIA INFUSION
150.0000 mg/kg | Freq: Once | INTRAVENOUS | Status: AC
  Administered 2014-03-27: 6810 mg via INTRAVENOUS
  Filled 2014-03-27: qty 171

## 2014-03-27 MED ORDER — DEXTROSE 5 % IV SOLN: 15.0000 mg/kg/h | INTRAVENOUS | Status: DC

## 2014-03-27 NOTE — Consult Note (Signed)
Referring Provider: No ref. provider found Primary Care Physician:  Roxy MannsMarne Tower, MD Primary Gastroenterologist:  Gentry FitzUnassigned  Reason for Consultation:  Elevated LFT's  HPI: [name] is a 28 y.o. female with history of chronic ethanol use, endometriosis, history ureteric stones, GERD, and major depression with prior suicidal attempt-DWI indictment 2004.  She presented to the ED on 4/3-4/4 with complaints of upper abdominal pain and episodes of vomiting which was non-bilious, nonbloody . She had very minimal elevation of LFTs, AST/ALT 60/44 with normal ALP and total bili, was given 3 L of IV fluids, started on a PPI and appropriately sent home and advised to followup with gastroenterology.   She returned to the ED today with complaints of upper abdominal pain and chest pain.  Her mother is at bedside and gives most of the history as the patient is sleepy and dozes off easily, but she did contribute to the conversation as well.  The patient actually hasn't eaten much since Wednesday and has not been drinking much in the way of fluids either.  She drinks about 4 glasses of wine per night (but patient mother thinks that she actually drinks more than that); she admits that she has not drank ETOH since Thursday.  She admits that she took 6 Tylenol PM over the course of several hours.  Says that she still has not been able to keep anything down.  She flat out denies any other medication ingestions including aspirin or any other illicit drugs.   On this occasion LFT's were very elevated with AST 1236, ALT 959, total bili 3.2 and normal ALP.  Ultrasound performed = 5 mm polyp in gallbladder otherwise negative exam.  NAC protocol was initiated but tylenol levels are normal.  Urine drug screen positive for THC and barbiturates.  ETOH level and salicylate level normal.  Her mother has autoimmune hepatitis.  EBV negative as well as viral hepatitis panel.  INR is 1.67.   Past Medical History  Diagnosis  Date  . Endometriosis   . Lumbar facet arthropathy   . UTI (lower urinary tract infection)   . Right ureteral stone   . History of suicide attempt WRIST CUTTINE    PT STATES LAST TIME AGE 1--  NO ATTEMPTS SINCE--  RECEIVED COUNSELING  . History of alcohol abuse AS TEEN  . OA (osteoarthritis of spine) LUMBAR --  PER PT  . History of major depression AGE 1-  SUICIDE ATTEMPT    PT STATES NO PROBLEMS SINCE  . CIN III (cervical intraepithelial neoplasia III)   . Acid reflux OCCASIONAL  . Urgency of urination   . Frequency of urination   . Nocturia   . Hematuria   . History of kidney stones     Past Surgical History  Procedure Laterality Date  . Cervical biopsy  w/ loop electrode excision  FEB  2013    CIN III  (GYN OFFICE)  . Wisdom tooth extraction  2012    ORAL SURGEON OFFICE  . Orif right little finger fx  2006  . Cystoscopy with ureteroscopy  04/20/2012    Procedure: CYSTOSCOPY WITH URETEROSCOPY;  Surgeon: Antony HasteMatthew Ramsey Eskridge, MD;  [facility];  Service: Urology;  Laterality: N/A;  CYSTO, RIGHT URETEROSCOPY ,  STONE BASKET EXTRACTION   CARM LASER      Prior to Admission medications   Medication Sig Start Date End Date Taking? Authorizing Provider  acetaminophen (TYLENOL) 500 MG tablet Take 500 mg by mouth every 6 (  six) hours as needed for mild pain.   Yes Historical Provider, MD  ALPRAZolam Prudy Feeler) 1 MG tablet Take 1 mg by mouth 2 (two) times daily as needed for anxiety.   Yes Historical Provider, MD  lansoprazole (PREVACID) 30 MG capsule Take 1 capsule (30 mg total) by mouth daily at 12 noon. 03/25/14  Yes Loren Racer, MD  ondansetron (ZOFRAN-ODT) 4 MG disintegrating tablet Take 4 mg by mouth every 4 (four) hours as needed for nausea or vomiting.   Yes Historical Provider, MD    Current Facility-Administered Medications  Medication Dose Route Frequency Provider Last Rate Last Dose  . acetylcysteine (ACETADOTE) 40 mg/mL load via infusion  6,810 mg  150 mg/kg Intravenous Once Rhetta Mura, MD 170.3 mL/hr at 03/27/14 1419 6,810 mg at 03/27/14 1419   Followed by  . acetylcysteine (ACETADOTE) 30,000 mg in dextrose 5 % 750 mL infusion  15 mg/kg/hr Intravenous Continuous Rhetta Mura, MD 17 mL/hr at 03/27/14 1420 15 mg/kg/hr at 03/27/14 1420   Current Outpatient Prescriptions  Medication Sig Dispense Refill  . acetaminophen (TYLENOL) 500 MG tablet Take 500 mg by mouth every 6 (six) hours as needed for mild pain.      Marland Kitchen ALPRAZolam (XANAX) 1 MG tablet Take 1 mg by mouth 2 (two) times daily as needed for anxiety.      . lansoprazole (PREVACID) 30 MG capsule Take 1 capsule (30 mg total) by mouth daily at 12 noon.  30 capsule  0  . ondansetron (ZOFRAN-ODT) 4 MG disintegrating tablet Take 4 mg by mouth every 4 (four) hours as needed for nausea or vomiting.        Allergies as of 03/27/2014 - Review Complete 03/27/2014  Allergen Reaction Noted  . Codeine Itching 03/25/2014    Family History  Problem Relation Age of Onset  . Cancer Mother   . Heart failure Father   . Hepatitis      Autoimmune treated with prednisone    History   Social History  . Marital Status: Single    Spouse Name: N/A    Number of Children: N/A  . Years of Education: N/A   Occupational History  . Not on file.   Social History Main Topics  . Smoking status: Current Every Day Smoker -- 0.50 packs/day for 8 years    Types: Cigarettes  . Smokeless tobacco: Never Used  . Alcohol Use: Yes     Comment: Ocassionally  . Drug Use: No     Comment: HISTORY MARIJIUANA USE YRS AGO--  PT STATES HAS NOT USED SINCE  . Sexual Activity: Yes    Birth Control/ Protection: None   Other Topics Concern  . Not on file   Social History Narrative  . No narrative on file    Review of Systems: Ten point ROS is O/W negative except as mentioned in HPI.  Physical Exam: Vital signs in last 24 hours: Temp:  [97.9 F (36.6 C)] 97.9 F (36.6 C) (04/06  0745) Pulse Rate:  [64-109] 70 (04/06 1330) Resp:  [9-28] 23 (04/06 1330) BP: (125-151)/(74-98) 125/89 mmHg (04/06 1330) SpO2:  [97 %-100 %] 100 % (04/06 1330) Weight:  [100 lb (45.36 kg)] 100 lb (45.36 kg) (04/06 1341)   General:  Alert but sleepy, Well-developed, well-nourished, pleasant and cooperative in NAD Head:  Normocephalic and atraumatic. Eyes:  Sclera clear, no icterus.  Conjunctiva pink. Ears:  Normal auditory acuity. Mouth:  No deformity or lesions.  Mucus membranes dry.  Lungs:  Clear throughout  to auscultation.  No wheezes, crackles, or rhonchi.  Heart:  Regular rate and rhythm; no murmurs, clicks, rubs, or gallops. Abdomen:  Soft, non-distended.  BS present.  Upper abdominal TTP > in the RUQ without R/R/G.   Rectal:  Deferred  Msk:  Symmetrical without gross deformities. Pulses:  Normal pulses noted. Extremities:  Without clubbing or edema. Neurologic:  Alert and  oriented x4;  grossly normal neurologically. Skin:  Intact without significant lesions or rashes.  Lab Results:  Recent Labs  03/24/14 2340 03/27/14 0840  WBC 17.3* 9.3  HGB 14.7 14.1  HCT 42.9 39.7  PLT 347 180   BMET  Recent Labs  03/24/14 2340 03/27/14 0840  NA 141 136*  K 4.0 3.1*  CL 98 99  CO2 13* 15*  GLUCOSE 92 147*  BUN 14 9  CREATININE 0.63 0.53  CALCIUM 9.8 9.2   LFT  Recent Labs  03/27/14 0840  PROT 7.5  ALBUMIN 4.9  AST 1236*  ALT 959*  ALKPHOS 63  BILITOT 3.2*   PT/INR  Recent Labs  03/27/14 1224  LABPROT 19.2*  INR 1.67*   Hepatitis Panel  Recent Labs  03/27/14 1001  HEPBSAG NEGATIVE  HCVAB NEGATIVE  HEPAIGM NON REACTIVE  HEPBIGM NON REACTIVE    Studies/Results: US Abdomen Limited  03/27/2014   CLINICAL DATA:  PAIN.  EXAM: US ABDOMEN LIMITED - RIGHT UPPER QUADRANT  COMPARISON:  DG ABD ACUTE W/CHEST dated 03/27/2014  FINDINGS: Gallbladder:  A 5 mm polyp is noted in the gallbladder. Gallbladder wall thickness normal. Negative Murphy sign. No  gallstones. No pericholecystic fluid collection.  Common bile duct:  Diameter: 2.2 mm  Liver:  No focal lesion identified. Within normal limits in parenchymal echogenicity. Limited views of the pancreas reveal no focal abnormality.  IMPRESSION: 5 mm polyp in the gallbladder, exam otherwise negative.   Electronically Signed   By: Maisie Fus  Register   On: 03/27/2014 11:41   Dg Abd Acute W/chest  03/27/2014   CLINICAL DATA:  Chest and abdominal pain; nausea and vomiting  EXAM: ACUTE ABDOMEN SERIES (ABDOMEN 2 VIEW & CHEST 1 VIEW)  COMPARISON:  Abdomen radiograph April 12, 2012  FINDINGS: PA chest: Lungs are clear. Heart size and pulmonary vascularity are normal. No adenopathy. There is upper lumbar levoscoliosis.  Supine and upright abdomen: There is moderate stool in the colon. Bowel gas pattern is unremarkable. No obstruction or free air. There is a 5 mm calculus with a nearby 2 mm calculus in the lower pole the left kidney. There are phleboliths in the pelvis. Liver appears prominent. There is lumbar levoscoliosis.  IMPRESSION: Bowel gas pattern unremarkable. Calculi in lower pole left kidney. Lungs clear.   Electronically Signed   By: Bretta Bang M.D.   On: 03/27/2014 08:41    IMPRESSION:  -Elevated LFT's in the setting of recent nausea, vomiting, and abdominal pain:  ETOH abuse, but would likely not account for her abrupt jump in LFT's.  Tylenol level, salicylate level, EBV, and viral hepatitis panel negative/normal.  NAC protocol was initiated, but can likely be stopped with normal acetaminophen level.  Mother has autoimmune hepatitis.  ? CMV. -History of major depression and suicide attempt several years ago   PLAN: -Monitor LFT's, coags, and mental status. -Will check CMV and ANA.  ? If we should check the remaining serologic evaluation at this time as well.   ZEHR, JESSICA D.  03/27/2014, 2:22 PM  Pager number [phone]  GI Attending Note   Chart  was reviewed and patient was examined.  X-rays and lab were reviewed.   Patient has acute hepatitis.  Etiology is uncertain.  Viral serologies have been negative, so far.  There is no history suggestive of toxic ingestion.  It would be rare for autoimmune hepatitis to present in such an acute fashion.  For now would limit for the blood work to CMV and ANA titers well following LFTs and INR.  Barbette Hair. Arlyce Dice, M.D., [facility] Cell [phone]

## 2014-03-27 NOTE — ED Notes (Signed)
Pt states she cannot give urine sample at this time.

## 2014-03-27 NOTE — H&P (Signed)
[facility] History and Physical  Stephanie CoolChelsea R Brazzle ZOX:[id number] DOB: October 01, 1986 DOA: 03/27/2014  Referring physician: ED PCP: Roxy MannsMarne Tower, MD  Specialists: GI called by me  Chief Complaint: Nausea vomiting abdominal pain  HPI: [name] is a 28 y.o. female no history of chronic ethanol use, endometriosis, history ureteric stones s/p 04/20/12, severe Genella RifeGerd, prior suicidal attempt-DWI indictment 2004 was in normal state of health about 4-5 days ago-[facility] with anxiety and had not been eating with upper abdominal pain for episodes of vomiting which is nonbilious nonbloody . She had very minimal elevation of LFTs, AST/ALT 60/44, was given 3 L of IV fluids and as was tachycardic this all resolved. Patient was started on a PPI and appropriately sent home and advised to followup with gastroenterology. Her mother is at bedside as patient is sleeping gives most of the history. She states that patient actually hasn't eaten since Wednesday properly and drank a little more than usual on Thursday. She had 4 small 1 bottles as well as vodka and other stuff and as she started having her period took about 2-3 a tablets of Midol for pain. The details of the history are not completely clear however patient does complain of chest pain yesterday not feeling well and still had not really drank since Wednesday and came to the emergency room because she wasn't doing well. Patient awakens a little bit and can tell me that she hasn't really had any diarrhea or any other issues he She flat out denies any other ingestions however mother states that she does smoke marijuana occasionally. She denies any aspirin overdose or taking any other illicit. She drinks about 12 involves his of alcohol daily and has been doing this for many years  Admission = sodium 136 chloride 99 CO2 15 BUN 9 creatinine 0.5 lipase 24, AST 1236, ALT 959, 0.2 troponin 0.00, lites gas is 0.7, ABG = pH 7.33, PaO2 103  on room air, PCO2 36 Ultrasound performed = 5 mm polyp in gallbladder otherwise negative exam   Review of Systems:  Past Medical History  Diagnosis Date  . Endometriosis   . Lumbar facet arthropathy   . UTI (lower urinary tract infection)   . Right ureteral stone   . History of suicide attempt WRIST CUTTINE    PT STATES LAST TIME AGE 27--  NO ATTEMPTS SINCE--  RECEIVED COUNSELING  . History of alcohol abuse AS TEEN  . OA (osteoarthritis of spine) LUMBAR --  PER PT  . History of major depression AGE 27-  SUICIDE ATTEMPT    PT STATES NO PROBLEMS SINCE  . CIN III (cervical intraepithelial neoplasia III)   . Acid reflux OCCASIONAL  . Urgency of urination   . Frequency of urination   . Nocturia   . Hematuria   . History of kidney stones    Past Surgical History  Procedure Laterality Date  . Cervical biopsy  w/ loop electrode excision  FEB  2013    CIN III  (GYN OFFICE)  . Wisdom tooth extraction  2012    ORAL SURGEON OFFICE  . Orif right little finger fx  2006  . Cystoscopy with ureteroscopy  04/20/2012    Procedure: CYSTOSCOPY WITH URETEROSCOPY;  Surgeon: Antony HasteMatthew Ramsey Eskridge, MD;  [facility];  Service: Urology;  Laterality: N/A;  CYSTO, RIGHT URETEROSCOPY ,  STONE BASKET EXTRACTION   CARM LASER     Social History:  History   Social  History Narrative  . No narrative on file    Allergies  Allergen Reactions  . Codeine Itching    Family History  Problem Relation Age of Onset  . Cancer Mother   . Heart failure Father     Prior to Admission medications   Medication Sig Start Date End Date Taking? Authorizing Provider  acetaminophen (TYLENOL) 500 MG tablet Take 500 mg by mouth every 6 (six) hours as needed for mild pain.   Yes Historical Provider, MD  ALPRAZolam Prudy Feeler) 1 MG tablet Take 1 mg by mouth 2 (two) times daily as needed for anxiety.   Yes Historical Provider, MD  lansoprazole (PREVACID) 30 MG capsule Take 1 capsule (30 mg total)  by mouth daily at 12 noon. 03/25/14  Yes Loren Racer, MD  ondansetron (ZOFRAN-ODT) 4 MG disintegrating tablet Take 4 mg by mouth every 4 (four) hours as needed for nausea or vomiting.   Yes Historical Provider, MD   Physical Exam: Filed Vitals:   03/27/14 1130 03/27/14 1200 03/27/14 1230 03/27/14 1324  BP: 129/92 133/96 139/93 126/79  Pulse: 64 64 65 71  Temp:      TempSrc:      Resp: 23 9 23 25   SpO2: 100% 100% 100% 100%     General:   sleeping but awakens, very dry mucosa   Eyes:  EOMI, NCAT   ENT:  throat dry , no JVD   Neck:  no bruits no thyromegaly   Cardiovascular:  S1-S2 mildly tachycardic  Respiratory:  S1-S2 no murmur rub or gallop  Abdomen: Soft nontender no rebound cannot appreciate organomegaly   Skin:  no lower extremity edema  Musculoskeletal:  range of motion intact   Psychiatric:  sleepy  Neurologic:  no asterixis but sleepy therefore exam preclude   Labs on Admission:  Basic Metabolic Panel:  Recent Labs Lab 03/24/14 2340 03/27/14 0840  NA 141 136*  K 4.0 3.1*  CL 98 99  CO2 13* 15*  GLUCOSE 92 147*  BUN 14 9  CREATININE 0.63 0.53  CALCIUM 9.8 9.2   Liver Function Tests:  Recent Labs Lab 03/24/14 2340 03/27/14 0840  AST 60* 1236*  ALT 44* 959*  ALKPHOS 65 63  BILITOT 0.8 3.2*  PROT 8.2 7.5  ALBUMIN 5.4* 4.9    Recent Labs Lab 03/24/14 2340 03/27/14 0840  LIPASE 17 24   No results found for this basename: AMMONIA,  in the last 168 hours CBC:  Recent Labs Lab 03/24/14 2340 03/27/14 0840  WBC 17.3* 9.3  NEUTROABS 14.7* 7.2  HGB 14.7 14.1  HCT 42.9 39.7  MCV 96.4 93.0  PLT 347 180   Cardiac Enzymes: No results found for this basename: CKTOTAL, CKMB, CKMBINDEX, TROPONINI,  in the last 168 hours  BNP (last 3 results) No results found for this basename: PROBNP,  in the last 8760 hours CBG: No results found for this basename: GLUCAP,  in the last 168 hours  Radiological Exams on Admission: US Abdomen  Limited  03/27/2014   CLINICAL DATA:  PAIN.  EXAM: US ABDOMEN LIMITED - RIGHT UPPER QUADRANT  COMPARISON:  DG ABD ACUTE W/CHEST dated 03/27/2014  FINDINGS: Gallbladder:  A 5 mm polyp is noted in the gallbladder. Gallbladder wall thickness normal. Negative Murphy sign. No gallstones. No pericholecystic fluid collection.  Common bile duct:  Diameter: 2.2 mm  Liver:  No focal lesion identified. Within normal limits in parenchymal echogenicity. Limited views of the pancreas reveal no focal abnormality.  IMPRESSION: 5  mm polyp in the gallbladder, exam otherwise negative.   Electronically Signed   By: Maisie Fus  Register   On: 03/27/2014 11:41   Dg Abd Acute W/chest  03/27/2014   CLINICAL DATA:  Chest and abdominal pain; nausea and vomiting  EXAM: ACUTE ABDOMEN SERIES (ABDOMEN 2 VIEW & CHEST 1 VIEW)  COMPARISON:  Abdomen radiograph April 12, 2012  FINDINGS: PA chest: Lungs are clear. Heart size and pulmonary vascularity are normal. No adenopathy. There is upper lumbar levoscoliosis.  Supine and upright abdomen: There is moderate stool in the colon. Bowel gas pattern is unremarkable. No obstruction or free air. There is a 5 mm calculus with a nearby 2 mm calculus in the lower pole the left kidney. There are phleboliths in the pelvis. Liver appears prominent. There is lumbar levoscoliosis.  IMPRESSION: Bowel gas pattern unremarkable. Calculi in lower pole left kidney. Lungs clear.   Electronically Signed   By: Bretta Bang M.D.   On: 03/27/2014 08:41    EKG: Independently reviewed. 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 Although she is in probably taken only 2-3 tablets, because of her heavy alcohol intake recently in addition to this I think is imperative to start N-acetyl cystine and follow the RumackMolli Hazard  normogram.    We will potentially transition this over to by mouth when she is more awake and alert.  I will repeat a complete metabolic panel every 12 hours, repeat PT/INR in the morning as she has some level of dysfunction and hopefully this will resolve.  Gastroenterology has been consulted to comment as she also has a history of autoimmune hepatitis in her mother who was treated for this about 10 years ago by Dr. Russella Dar and is now off prednisone. If it is thought that she has this instead, she may benefit from a biopsy of the liver.  She is arousable and clinically very stable and can be managed on MedSurg for right now Active Problems:   Metabolic acidosis-likely secondary to starvation ketosis as evidenced by UA showing ketones.   Lactic acid not elevated so unlikely related to salicylate  Anion gap is 20.  Expected anion gap = 12.2.  = Anion gap/hco3=[20-12]/[24-15]          =8/9          =1 Osm Gap=[2 x sodium]+ [glucose/18] + [Bun/2.8]     = [2x 136]+[147/18]=+ [9/2.8]                = 272+8.1+3.2                = 283 Normal Osmolality=285 to 295 and there is no specific gap and this is not suggestive of ingestion per se which refitted with either acetaminophen or ethanol. It is doubtful that she taken ethleebn  glycol, isopropyl alcohol     ANXIETY-hold Xanax for now   TOBACCO ABUSE-suggest cessation when more awake   GERD-continue Protonix   ALCOHOL ABUSE, HX OF-we will get a drug screen as well as a serum osmolality to rule out ingestion   RENAL CALCULUS, HX OF   Hepatitis  Discussed with mother at bedside patient is a full code   Time spent: 85 minutes  Mahala Menghini [facility] Pager [phone] therefore  If 7PM-7AM, please contact night-coverage [email or web address] Password [email or web address] 03/27/2014, 1:33 PM

## 2014-03-27 NOTE — ED Notes (Signed)
Pt was seen on Saturday for same symptoms. Pt c/o abd and chest pain and vomiting. Pt states she has not tried to take nausea meds today. Last took them last night.

## 2014-03-27 NOTE — Progress Notes (Signed)
UR completed 

## 2014-03-27 NOTE — ED Notes (Signed)
I asked patient if she was able to give a urine specimen, she said "no". Mother tried to tell her that we needed it so we could test it. RN Leotis ShamesLauren notified.

## 2014-03-27 NOTE — Progress Notes (Signed)
[facility] provided pt with a list of primary care resources to help patient establish primary care.  °

## 2014-03-27 NOTE — ED Provider Notes (Signed)
CSN: [id number]     Arrival date & time 03/27/14  0735 History   First MD Initiated Contact with Patient 03/27/14 [phone]     Chief Complaint  Patient presents with  . Emesis  . Abdominal Pain  . Chest Pain     (Consider location/radiation/quality/duration/timing/severity/associated sxs/prior Treatment) The history is provided by the patient.  [name] is a 28 y.o. female hx of UTI, endometriosis, here with vomiting, abdominal pain. , Pain and vomiting for the last 4 days. Came in 3 days ago and had the elevated LFTs with normal abdominal ultrasound. She was given some IV fluids and Zofran and felt better and went home. Has been using Zofran intermittently last use was yesterday. Since yesterday had some bilious vomiting. She also has worsening diffuse abdominal pain as well as some chest pain. She had some trouble swallowing as well. Denies any fevers denies any urinary symptoms. Her boyfriend for many years also left her yesterday and symptoms worsened since then. Has been out of xanax recently.    Past Medical History  Diagnosis Date  . Endometriosis   . Lumbar facet arthropathy   . UTI (lower urinary tract infection)   . Right ureteral stone   . History of suicide attempt WRIST CUTTINE    PT STATES LAST TIME AGE 51--  NO ATTEMPTS SINCE--  RECEIVED COUNSELING  . History of alcohol abuse AS TEEN  . OA (osteoarthritis of spine) LUMBAR --  PER PT  . History of major depression AGE 51-  SUICIDE ATTEMPT    PT STATES NO PROBLEMS SINCE  . CIN III (cervical intraepithelial neoplasia III)   . Acid reflux OCCASIONAL  . Urgency of urination   . Frequency of urination   . Nocturia   . Hematuria   . History of kidney stones    Past Surgical History  Procedure Laterality Date  . Cervical biopsy  w/ loop electrode excision  FEB  2013    CIN III  (GYN OFFICE)  . Wisdom tooth extraction  2012    ORAL SURGEON OFFICE  . Orif right little finger fx  2006  . Cystoscopy with ureteroscopy   04/20/2012    Procedure: CYSTOSCOPY WITH URETEROSCOPY;  Surgeon: Antony HasteMatthew Ramsey Eskridge, MD;  [facility];  Service: Urology;  Laterality: N/A;  CYSTO, RIGHT URETEROSCOPY ,  STONE BASKET EXTRACTION   CARM LASER     Family History  Problem Relation Age of Onset  . Cancer Mother   . Heart failure Father    History  Substance Use Topics  . Smoking status: Current Every Day Smoker -- 0.50 packs/day for 8 years    Types: Cigarettes  . Smokeless tobacco: Never Used  . Alcohol Use: Yes     Comment: Ocassionally   OB History   Grav Para Term Preterm Abortions TAB SAB Ect Mult Living                 Review of Systems  Cardiovascular: Positive for chest pain.  Gastrointestinal: Positive for vomiting and abdominal pain.  All other systems reviewed and are negative.      Allergies  Codeine  Home Medications   Current Outpatient Rx  Name  Route  Sig  Dispense  Refill  . acetaminophen (TYLENOL) 500 MG tablet   Oral   Take 500 mg by mouth every 6 (six) hours as needed for mild pain.         Marland Kitchen. ALPRAZolam (XANAX) 1 MG tablet  Oral   Take 1 mg by mouth 2 (two) times daily as needed for anxiety.         . lansoprazole (PREVACID) 30 MG capsule   Oral   Take 1 capsule (30 mg total) by mouth daily at 12 noon.   30 capsule   0   . ondansetron (ZOFRAN-ODT) 4 MG disintegrating tablet   Oral   Take 4 mg by mouth every 4 (four) hours as needed for nausea or vomiting.          BP 126/92  Pulse 69  Temp(Src) 97.9 F (36.6 C) (Oral)  Resp 26  SpO2 100%  LMP 03/12/2014 Physical Exam  Nursing note and vitals reviewed. Constitutional: She is oriented to person, place, and time.  Uncomfortable, dehydrated. Anxious   HENT:  Head: Normocephalic.  MM dry   Eyes: Conjunctivae are normal. Pupils are equal, round, and reactive to light.  Neck: Normal range of motion. Neck supple.  Cardiovascular: Regular rhythm and normal heart sounds.   Slightly  tachy  Pulmonary/Chest: Effort normal and breath sounds normal. No respiratory distress. She has no wheezes. She has no rales.  Abdominal: Soft.  Mild diffuse tenderness, worse in epigastrium. No rebound   Musculoskeletal: Normal range of motion.  Neurological: She is alert and oriented to person, place, and time. No cranial nerve deficit. Coordination normal.  Skin: Skin is warm and dry.  Psychiatric:  Anxious     ED Course  Procedures (including critical care time) Labs Review Labs Reviewed  CBC WITH DIFFERENTIAL - Abnormal; Notable for the following:    Neutrophils Relative % 78 (*)    All other components within normal limits  COMPREHENSIVE METABOLIC PANEL - Abnormal; Notable for the following:    Sodium 136 (*)    Potassium 3.1 (*)    CO2 15 (*)    Glucose, Bld 147 (*)    AST 1236 (*)    ALT 959 (*)    Total Bilirubin 3.2 (*)    All other components within normal limits  URINALYSIS, ROUTINE W REFLEX MICROSCOPIC - Abnormal; Notable for the following:    Color, Urine AMBER (*)    Ketones, ur 15 (*)    All other components within normal limits  LIPASE, BLOOD  PREGNANCY, URINE  MONONUCLEOSIS SCREEN  ETHANOL  ACETAMINOPHEN LEVEL  HEPATITIS PANEL, ACUTE  OSMOLALITY  PROTIME-INR  URINE RAPID DRUG SCREEN (HOSP PERFORMED)  I-STAT TROPOININ, ED   Imaging Review US Abdomen Limited  03/27/2014   CLINICAL DATA:  PAIN.  EXAM: US ABDOMEN LIMITED - RIGHT UPPER QUADRANT  COMPARISON:  DG ABD ACUTE W/CHEST dated 03/27/2014  FINDINGS: Gallbladder:  A 5 mm polyp is noted in the gallbladder. Gallbladder wall thickness normal. Negative Murphy sign. No gallstones. No pericholecystic fluid collection.  Common bile duct:  Diameter: 2.2 mm  Liver:  No focal lesion identified. Within normal limits in parenchymal echogenicity. Limited views of the pancreas reveal no focal abnormality.  IMPRESSION: 5 mm polyp in the gallbladder, exam otherwise negative.   Electronically Signed   By: Maisie Fus  Register    On: 03/27/2014 11:41   Dg Abd Acute W/chest  03/27/2014   CLINICAL DATA:  Chest and abdominal pain; nausea and vomiting  EXAM: ACUTE ABDOMEN SERIES (ABDOMEN 2 VIEW & CHEST 1 VIEW)  COMPARISON:  Abdomen radiograph April 12, 2012  FINDINGS: PA chest: Lungs are clear. Heart size and pulmonary vascularity are normal. No adenopathy. There is upper lumbar levoscoliosis.  Supine and upright  abdomen: There is moderate stool in the colon. Bowel gas pattern is unremarkable. No obstruction or free air. There is a 5 mm calculus with a nearby 2 mm calculus in the lower pole the left kidney. There are phleboliths in the pelvis. Liver appears prominent. There is lumbar levoscoliosis.  IMPRESSION: Bowel gas pattern unremarkable. Calculi in lower pole left kidney. Lungs clear.   Electronically Signed   By: Bretta Bang M.D.   On: 03/27/2014 08:41     EKG Interpretation   Date/Time:  Monday March 27 2014 07:47:58 EDT Ventricular Rate:  109 PR Interval:  129 QRS Duration: 74 QT Interval:  386 QTC Calculation: 520 R Axis:   72 Text Interpretation:  Sinus tachycardia Biatrial enlargement Borderline  repolarization abnormality Prolonged QT interval Baseline wander in  lead(s) I II III aVR aVF Since last tracing rate faster Confirmed by YAO   MD, DAVID ([id number]) on 03/27/2014 8:22:05 AM      MDM   Final diagnoses:  None   VERTIS BAUDER is a 28 y.o. female here with vomiting, ab pain. Likely worsening gastritis that is exacerbated by anxiety. Will give zofran, pain meds, hydrate patient. Will recheck labs.   10 AM LFTs close to 1000. Bili elevated. Consider hepatitis. Denies recent alcohol use, only took 2 tylenols yesterday. Will add on hepatitis panel and tylenol and etoh level. Will get RUQ [place] to assess.   12:05 PM US unremarkable. Tylenol, etoh level neg. Acute hepatitis panel pending. Will admit to med/surg for possible hepatitis, dehydration.   Richardean Canal, MD 03/27/14 [phone]

## 2014-03-27 NOTE — ED Notes (Signed)
Pt states was here over the weekend for abdominal/chest pains and vomiting, was sent home w/ acid reflux and nausea medication, states was helping with the vomiting but having severe abdominal discomfort, states was able to keep down a half can of soup yesterday but nothing else, denies diarrhea.

## 2014-03-28 LAB — COMPREHENSIVE METABOLIC PANEL
ALBUMIN: 3.5 g/dL (ref 3.5–5.2)
ALK PHOS: 51 U/L (ref 39–117)
ALT: 2410 U/L — ABNORMAL HIGH (ref 0–35)
ALT: 2339 U/L — ABNORMAL HIGH (ref 0–35)
AST: 2696 U/L — ABNORMAL HIGH (ref 0–37)
AST: 2002 U/L — ABNORMAL HIGH (ref 0–37)
Albumin: 3.2 g/dL — ABNORMAL LOW (ref 3.5–5.2)
Alkaline Phosphatase: 62 U/L (ref 39–117)
BILIRUBIN TOTAL: 3.1 mg/dL — AB (ref 0.3–1.2)
BUN: 5 mg/dL — AB (ref 6–23)
BUN: 4 mg/dL — ABNORMAL LOW (ref 6–23)
CALCIUM: 8.4 mg/dL (ref 8.4–10.5)
CHLORIDE: 104 meq/L (ref 96–112)
CO2: 22 mEq/L (ref 19–32)
CO2: 26 mEq/L (ref 19–32)
CREATININE: 0.54 mg/dL (ref 0.50–1.10)
Calcium: 7.9 mg/dL — ABNORMAL LOW (ref 8.4–10.5)
Chloride: 104 mEq/L (ref 96–112)
Creatinine, Ser: 0.39 mg/dL — ABNORMAL LOW (ref 0.50–1.10)
GFR calc Af Amer: >90 mL/min (ref >90)
GFR calc non Af Amer: >90 mL/min (ref >90)
Glucose, Bld: 79 mg/dL (ref 70–99)
Glucose, Bld: 89 mg/dL (ref 70–99)
POTASSIUM: 3.5 meq/L — AB (ref 3.7–5.3)
Potassium: 3.7 mEq/L (ref 3.7–5.3)
Sodium: 136 mEq/L — ABNORMAL LOW (ref 137–147)
Sodium: 140 mEq/L (ref 137–147)
TOTAL PROTEIN: 5.1 g/dL — AB (ref 6.0–8.3)
Total Bilirubin: 3 mg/dL — ABNORMAL HIGH (ref 0.3–1.2)
Total Protein: 5.6 g/dL — ABNORMAL LOW (ref 6.0–8.3)

## 2014-03-28 LAB — CBC
HCT: 34.4 % — ABNORMAL LOW (ref 36.0–46.0)
Hemoglobin: 12.2 g/dL (ref 12.0–15.0)
MCH: 33.1 pg (ref 26.0–34.0)
MCHC: 35.5 g/dL (ref 30.0–36.0)
MCV: 93.2 fL (ref 78.0–100.0)
PLATELETS: 222 10*3/uL (ref 150–400)
RBC: 3.69 MIL/uL — ABNORMAL LOW (ref 3.87–5.11)
RDW: 12.4 % (ref 11.5–15.5)
WBC: 10.5 10*3/uL (ref 4.0–10.5)

## 2014-03-28 LAB — APTT: APTT: 30 s (ref 24–37)

## 2014-03-28 LAB — PROTIME-INR
INR: 2.27 — ABNORMAL HIGH (ref 0.00–1.49)
PROTHROMBIN TIME: 24.3 s — AB (ref 11.6–15.2)

## 2014-03-28 LAB — ANA: Anti Nuclear Antibody(ANA): NEGATIVE

## 2014-03-28 MED ORDER — HYDROMORPHONE HCL PF 1 MG/ML IJ SOLN
0.5000 mg | INTRAMUSCULAR | Status: DC | PRN
  Administered 2014-03-28 – 2014-04-01 (×24): 0.5 mg via INTRAVENOUS
  Filled 2014-03-28 (×24): qty 1

## 2014-03-28 MED ORDER — NICOTINE 14 MG/24HR TD PT24
14.0000 mg | MEDICATED_PATCH | Freq: Every day | TRANSDERMAL | Status: DC
  Administered 2014-03-28 – 2014-04-01 (×5): 14 mg via TRANSDERMAL
  Filled 2014-03-28 (×5): qty 1

## 2014-03-28 NOTE — Progress Notes (Signed)
[facility] Progress Note  Subjective:  Feels ok today.  Tolerating clear liquid diet.  Not so sleepy today and conversing well.  Objective:  Vital signs in last 24 hours: Temp:  [97.5 F (36.4 C)-98.4 F (36.9 C)] 98.4 F (36.9 C) (04/07 0607) Pulse Rate:  [62-76] 74 (04/07 0607) Resp:  [9-26] 18 (04/07 0607) BP: (102-149)/(66-103) 102/66 mmHg (04/07 0607) SpO2:  [100 %] 100 % (04/07 0607) Weight:  [100 lb (45.36 kg)] 100 lb (45.36 kg) (04/06 1341) Last BM Date: 03/27/14 General:  Alert, Well-developed, in NAD Heart:  Regular rate and rhythm; no murmurs Pulm:  CTAB.  No W/R/R. Abdomen:  Soft, non-distended. Normal bowel sounds.  Mild upper abdominal TTP without R/R/G.   Extremities:  Without edema. Neurologic:  Alert and  oriented x4;  grossly normal neurologically. Psych:  Alert and cooperative. Normal mood and affect.  Intake/Output from previous day: 04/06 0701 - 04/07 0700 In: 1716.5 [P.O.:540; I.V.:1176.5] Out: -   Lab Results:  Recent Labs  03/27/14 0840 03/28/14 0310  WBC 9.3 10.5  HGB 14.1 12.2  HCT 39.7 34.4*  PLT 180 222   BMET  Recent Labs  03/27/14 0840 03/27/14 1643 03/28/14 0310  NA 136* 136* 136*  K 3.1* 4.8 3.5*  CL 99 102 104  CO2 15* 22 22  GLUCOSE 147* 135* 79  BUN 9 6 5*  CREATININE 0.53 0.39* 0.39*  CALCIUM 9.2 7.9* 7.9*   LFT  Recent Labs  03/28/14 0310  PROT 5.1*  ALBUMIN 3.2*  AST 2696*  ALT 2410*  ALKPHOS 51  BILITOT 3.1*   PT/INR  Recent Labs  03/27/14 1224 03/28/14 0310  LABPROT 19.2* 24.3*  INR 1.67* 2.27*   Hepatitis Panel  Recent Labs  03/27/14 1001  HEPBSAG NEGATIVE  HCVAB NEGATIVE  HEPAIGM NON REACTIVE  HEPBIGM NON REACTIVE    [place] Abdomen Limited  03/27/2014   CLINICAL DATA:  PAIN.  EXAM: US ABDOMEN LIMITED - RIGHT UPPER QUADRANT  COMPARISON:  DG ABD ACUTE W/CHEST dated 03/27/2014  FINDINGS: Gallbladder:  A 5 mm polyp is noted in the gallbladder. Gallbladder wall thickness normal.  Negative Murphy sign. No gallstones. No pericholecystic fluid collection.  Common bile duct:  Diameter: 2.2 mm  Liver:  No focal lesion identified. Within normal limits in parenchymal echogenicity. Limited views of the pancreas reveal no focal abnormality.  IMPRESSION: 5 mm polyp in the gallbladder, exam otherwise negative.   Electronically Signed   By: Maisie Fushomas  Register   On: 03/27/2014 11:41   Dg Abd Acute W/chest  03/27/2014   CLINICAL DATA:  Chest and abdominal pain; nausea and vomiting  EXAM: ACUTE ABDOMEN SERIES (ABDOMEN 2 VIEW & CHEST 1 VIEW)  COMPARISON:  Abdomen radiograph April 12, 2012  FINDINGS: PA chest: Lungs are clear. Heart size and pulmonary vascularity are normal. No adenopathy. There is upper lumbar levoscoliosis.  Supine and upright abdomen: There is moderate stool in the colon. Bowel gas pattern is unremarkable. No obstruction or free air. There is a 5 mm calculus with a nearby 2 mm calculus in the lower pole the left kidney. There are phleboliths in the pelvis. Liver appears prominent. There is lumbar levoscoliosis.  IMPRESSION: Bowel gas pattern unremarkable. Calculi in lower pole left kidney. Lungs clear.   Electronically Signed   By: Bretta BangWilliam  Woodruff M.D.   On: 03/27/2014 08:41    Assessment / Plan: -Elevated LFT's in the setting of recent nausea, vomiting, and abdominal pain:  Patient has acute hepatitis  on unknown etiology.  LFT's are trending up as well as her INR.  ETOH abuse, but would likely not account for her abrupt jump in LFT's. Tylenol level, salicylate level, EBV, and viral hepatitis panel negative/normal. NAC protocol was initiated, but can likely be stopped with normal acetaminophen level. Mother has autoimmune hepatitis. ? CMV.  -History of major depression and suicide attempt several years ago   -Monitor LFT's, coags, and mental status.  -Will check CMV and ANA; awaiting these results.  CMV was never collected and I am waiting for a call back from the lab.     LOS: 1 day   ZEHR, JESSICA D.  03/28/2014, 8:52 AM  Pager number [phone]  GI Attending Note  I have personally taken an interval history, reviewed the chart, and examined the patient.  Despite rising numbers patient looks well.  Abdominal pain has decreased and her appetite is excellent.  Continue to monitor INR and LFTs very closely.  Should there be any clinical deterioration, or if coagulopathy or LFTs worsen, would consider transfer to a liver transplant unit.  Barbette Hair. Arlyce Dice, MD, [facility] [phone]

## 2014-03-28 NOTE — Progress Notes (Signed)
Note: This document was prepared with digital dictation and possible smart phrase technology. Any transcriptional errors that result from this process are unintentional.   Stephanie CoolChelsea R Mccann UJW:[id number] DOB: 10-Aug-1986 DOA: 03/27/2014 PCP: Roxy MannsMarne Tower, MD  Brief narrative: 28 y.o. female known history of chronic ethanol use, endometriosis, history ureteric stones s/p 04/20/12, severe Genella RifeGerd, prior suicidal attempt-DWI indictment 2004 was in normal state of health about 4-5 days ago-[facility] anxiety and had not been eating with upper abdominal pain for episodes of vomiting which is nonbilious nonbloody . She had very minimal elevation of LFTs, AST/ALT 60/44, was given 3 L of IV fluids and as was tachycardic this all resolved. Patient was started on a PPI and appropriately sent home and advised to followup with gastroenterology. She apparently fell ill 03/23/13 decreased by mouth intake and had had a couple of drinks subsequent to that.  At at first admitted taking a certain amount alcohol daily now she declines to state how much she drinks daily.  Admission = sodium 136 chloride 99 CO2 15 BUN 9 creatinine 0.5 lipase 24, AST 1236, ALT 959, 0.2 troponin 0.00, lites gas is 0.7,  ABG = pH 7.33, PaO2 103 on room air, PCO2 36  Ultrasound performed = 5 mm polyp in gallbladder otherwise negative exam    Past medical history-As per Problem list Chart reviewed as below- Review  Consultants:  Gastroenterology  Procedures:  Ultrasound  Antibiotics:  None   Subjective  Tolerating clears much more alert awake No fevers no chills no nausea no vomiting not eating really no diarrhea or stool of any kind    Objective    Interim History:   Telemetry:    Objective: Filed Vitals:   03/27/14 1430 03/27/14 1528 03/27/14 2216 03/28/14 0607  BP: 146/99 149/103 110/78 102/66  Pulse:  62 76 74  Temp:  97.5 F (36.4 C) 97.9 F (36.6 C) 98.4 F (36.9 C)  TempSrc:   Oral Oral Oral  Resp: 22 19 18 18   Height:      Weight:      SpO2:  100% 100% 100%    Intake/Output Summary (Last 24 hours) at 03/28/14 1200 Last data filed at 03/28/14 0608  Gross per 24 hour  Intake 1716.5 ml  Output      0 ml  Net 1716.5 ml    Exam:  General: sleeping but awakens, very dry mucosa  Eyes: EOMI, NCAT  Neck: no bruits no thyromegaly  Cardiovascular: S1-S2 mildly tachycardic  Respiratory: S1-S2 no murmur rub or gallop  Abdomen: Soft nontender no rebound cannot appreciate organomegaly  Neurologic: no asterixis but sleepy therefore exam preclude   Data Reviewed: Basic Metabolic Panel:  Recent Labs Lab 03/24/14 2340 03/27/14 0840 03/27/14 1643 03/28/14 0310  NA 141 136* 136* 136*  K 4.0 3.1* 4.8 3.5*  CL 98 99 102 104  CO2 13* 15* 22 22  GLUCOSE 92 147* 135* 79  BUN 14 9 6  5*  CREATININE 0.63 0.53 0.39* 0.39*  CALCIUM 9.8 9.2 7.9* 7.9*   Liver Function Tests:  Recent Labs Lab 03/24/14 2340 03/27/14 0840 03/27/14 1643 03/28/14 0310  AST 60* 1236* 1825* 2696*  ALT 44* 959* 1449* 2410*  ALKPHOS 65 63 59 51  BILITOT 0.8 3.2* 3.1* 3.1*  PROT 8.2 7.5 6.4 5.1*  ALBUMIN 5.4* 4.9 4.1 3.2*    Recent Labs Lab 03/24/14 2340 03/27/14 0840  LIPASE 17 24   No results found for  this basename: AMMONIA,  in the last 168 hours CBC:  Recent Labs Lab 03/24/14 2340 03/27/14 0840 03/28/14 0310  WBC 17.3* 9.3 10.5  NEUTROABS 14.7* 7.2  --   HGB 14.7 14.1 12.2  HCT 42.9 39.7 34.4*  MCV 96.4 93.0 93.2  PLT 347 180 222   Cardiac Enzymes:  Recent Labs Lab 03/27/14 1001  CKTOTAL 86   BNP: No components found with this basename: POCBNP,  CBG: No results found for this basename: GLUCAP,  in the last 168 hours  No results found for this or any previous visit (from the past 240 hour(s)).   Studies:              All Imaging reviewed and is as per above notation   Scheduled Meds: . nicotine  14 mg Transdermal Daily  . pantoprazole  20 mg  Oral Daily  . potassium chloride SA  40 mEq Oral Once   Continuous Infusions:    Assessment/Plan: 1. Ischemic hepatitis-likely secondary to either autoimmune process or infection-unlikely to be secondary to Tylenol use as Tylenol level consecutively less than 15. NAC has been discontinued. Hepatitis screen is negative mononucleosis screen is negative, salicylic acid is less than 0.2, ANA is negative, await CMV titers  Ordered-her liver function has declined significantly and her INR is now elevated. If she continues to have these findings she may need referral to a tertiary center for management 2. Alcoholism-patient has no insight into her habits. 3. Tobacco abuse -patch ordered 4. Suicidality in the past-stable 5. History endometriosis-stable 6. Reflux continue PPI  Code Status: Full Family Communication: Discussed briefly the mother at bedside Disposition Plan: Inpatient   Pleas Koch, MD  [facility] Pager [phone] 03/28/2014, 12:00 PM    LOS: 1 day

## 2014-03-29 DIAGNOSIS — F1021 Alcohol dependence, in remission: Secondary | ICD-10-CM

## 2014-03-29 DIAGNOSIS — R7989 Other specified abnormal findings of blood chemistry: Secondary | ICD-10-CM

## 2014-03-29 DIAGNOSIS — K7200 Acute and subacute hepatic failure without coma: Principal | ICD-10-CM

## 2014-03-29 DIAGNOSIS — F17200 Nicotine dependence, unspecified, uncomplicated: Secondary | ICD-10-CM

## 2014-03-29 DIAGNOSIS — K219 Gastro-esophageal reflux disease without esophagitis: Secondary | ICD-10-CM

## 2014-03-29 LAB — COMPREHENSIVE METABOLIC PANEL
ALBUMIN: 3.6 g/dL (ref 3.5–5.2)
ALK PHOS: 74 U/L (ref 39–117)
ALT: 2979 U/L — ABNORMAL HIGH (ref 0–35)
ALT: 11976 U/L — ABNORMAL HIGH (ref 0–35)
AST: 2276 U/L — AB (ref 0–37)
Albumin: 3.8 g/dL (ref 3.5–5.2)
Alkaline Phosphatase: 67 U/L (ref 39–117)
BILIRUBIN TOTAL: 1.9 mg/dL — AB (ref 0.3–1.2)
BUN: 5 mg/dL — ABNORMAL LOW (ref 6–23)
BUN: 6 mg/dL (ref 6–23)
CALCIUM: 8.5 mg/dL (ref 8.4–10.5)
CHLORIDE: 98 meq/L (ref 96–112)
CO2: 25 meq/L (ref 19–32)
CO2: 26 meq/L (ref 19–32)
Calcium: 9 mg/dL (ref 8.4–10.5)
Chloride: 101 mEq/L (ref 96–112)
Creatinine, Ser: 0.48 mg/dL — ABNORMAL LOW (ref 0.50–1.10)
Creatinine, Ser: 0.61 mg/dL (ref 0.50–1.10)
GFR calc Af Amer: >90 mL/min (ref >90)
GFR calc non Af Amer: >90 mL/min (ref >90)
GLUCOSE: 88 mg/dL (ref 70–99)
Glucose, Bld: 91 mg/dL (ref 70–99)
Potassium: 3.9 mEq/L (ref 3.7–5.3)
Potassium: 3.9 mEq/L (ref 3.7–5.3)
SODIUM: 139 meq/L (ref 137–147)
Sodium: 136 mEq/L — ABNORMAL LOW (ref 137–147)
TOTAL PROTEIN: 5.8 g/dL — AB (ref 6.0–8.3)
Total Bilirubin: 2.9 mg/dL — ABNORMAL HIGH (ref 0.3–1.2)
Total Protein: 6.1 g/dL (ref 6.0–8.3)

## 2014-03-29 LAB — PROTIME-INR
INR: 1.75 — AB (ref 0.00–1.49)
Prothrombin Time: 19.9 seconds — ABNORMAL HIGH (ref 11.6–15.2)

## 2014-03-29 MED ORDER — ONDANSETRON HCL 4 MG/2ML IJ SOLN
4.0000 mg | Freq: Four times a day (QID) | INTRAMUSCULAR | Status: DC | PRN
  Administered 2014-03-29 – 2014-04-01 (×8): 4 mg via INTRAVENOUS
  Filled 2014-03-29 (×8): qty 2

## 2014-03-29 NOTE — Progress Notes (Signed)
Progress Note   [name] WUJ:[id number] DOB: 20-May-1986 DOA: 03/27/2014 PCP: Roxy Manns, MD   Brief Narrative:    [name] is an 28 y.o. female with a PMH of chronic ethanol use, endometriosis, history of ureteral stones, GERD, prior suicide attempt who initially presented to the ER on 03/24/14 with complaints of upper abdominal pain and nonbilious vomiting. LFTs were minimally elevated on that visit.  She subsequently was discharged home and instructed to followup with gastroenterology. She returned to the ER on 03/27/14 with persistent symptoms, and was noted to have marked elevation of her LFTs.   Assessment/Plan:    Principal Problem:   Acute hepatitis The patient has rising LFTs in the setting of recent nausea, vomiting, abdominal pain, and alcohol abuse. Currently, the etiology is not definitively known. No elevation of acetaminophen level. No elevation of salicylates. EBV and viral hepatitis panel negative. NAC protocol discontinued in light of normal acetaminophen levels.  The patient's mother has a history of autoimmune hepatitis, but the patient's ANA is negative. CMV studies pending. May need transfer to a tertiary care center if the patient deteriorates clinically. LFTs slightly worse today, but INR down. Active Problems:   ANXIETY Patient normally takes Xanax 1 mg twice a day.   TOBACCO ABUSE Counsel. Nicotine patch ordered.   GERD Continue Protonix 20 mg daily.   ALCOHOL ABUSE, HX OF Doubt she would be a candidate for liver transplantation given her history of alcohol abuse. Social worker consultation for substance abuse counseling will be requested.   DVT Prophylaxis Continue SCDs.  Code Status: Full. Family Communication: [name], mother, updated at bedside. Disposition Plan: Home when stable.   IV access:  Peripheral IV  Procedures  None.  Medical Consultants:  Dr. Melvia Heaps, Gastroenterology  Other  Consultants:  None.  Anti-infectives:  None.  Subjective:    [name] is lethargic this morning. Patient's mother reports that she has had ongoing problems with nausea and vomiting of bilious emesis. No complaints of diarrhea. Still has abdominal pain.  Objective:    Filed Vitals:   03/28/14 0607 03/28/14 1400 03/28/14 2142 03/29/14 0443  BP: 102/66 108/70 125/88 123/85  Pulse: 74 97 75 86  Temp: 98.4 F (36.9 C) 98.3 F (36.8 C) 97.9 F (36.6 C) 97.6 F (36.4 C)  TempSrc: Oral Oral Oral Oral  Resp: 18 18 18 18   Height:      Weight:      SpO2: 100% 100% 100% 98%   No intake or output data in the 24 hours ending 03/29/14 0814  Exam: Gen:  NAD, lethargic Cardiovascular:  RRR, No M/R/G Respiratory:  Lungs CTAB Gastrointestinal:  Abdomen soft, tender right upper quadrant, + BS Extremities:  No C/E/C   Data Reviewed:    Labs: Basic Metabolic Panel:  Recent Labs Lab 03/27/14 0840 03/27/14 1643 03/28/14 0310 03/28/14 1510 03/29/14 0335  NA 136* 136* 136* 140 139  K 3.1* 4.8 3.5* 3.7 3.9  CL 99 102 104 104 101  CO2 15* 22 22 26 25   GLUCOSE 147* 135* 79 89 88  BUN 9 6 5* 4* 5*  CREATININE 0.53 0.39* 0.39* 0.54 0.48*  CALCIUM 9.2 7.9* 7.9* 8.4 8.5   GFR Estimated Creatinine Clearance: 75.7 ml/min (by C-G formula based on Cr of 0.48). Liver Function Tests:  Recent Labs Lab 03/27/14 0840 03/27/14 1643 03/28/14 0310 03/28/14 1510 03/29/14 0335  AST 1236* 1825* 2696* 2002* 2276*  ALT 959* 1449* 2410* 2339*  2979*  ALKPHOS 63 59 51 62 67  BILITOT 3.2* 3.1* 3.1* 3.0* 2.9*  PROT 7.5 6.4 5.1* 5.6* 5.8*  ALBUMIN 4.9 4.1 3.2* 3.5 3.8    Recent Labs Lab 03/24/14 2340 03/27/14 0840  LIPASE 17 24   Coagulation profile  Recent Labs Lab 03/27/14 1224 03/28/14 0310 03/29/14 0335  INR 1.67* 2.27* 1.75*    CBC:  Recent Labs Lab 03/24/14 2340 03/27/14 0840 03/28/14 0310  WBC 17.3* 9.3 10.5  NEUTROABS 14.7* 7.2  --   HGB 14.7 14.1  12.2  HCT 42.9 39.7 34.4*  MCV 96.4 93.0 93.2  PLT 347 180 222   Cardiac Enzymes:  Recent Labs Lab 03/27/14 1001  CKTOTAL 86   Sepsis Labs:  Recent Labs Lab 03/24/14 2340 03/27/14 0840 03/27/14 1239 03/28/14 0310  WBC 17.3* 9.3  --  10.5  LATICACIDVEN  --   --  0.73  --    Microbiology No results found for this or any previous visit (from the past 240 hour(s)).   Radiographs/Studies:    US Abdomen Complete  04/18/2014   CLINICAL DATA:  Abdominal pain, nausea and vomiting.  EXAM: ULTRASOUND ABDOMEN COMPLETE  COMPARISON:  CT of the abdomen and pelvis performed 04/15/2012, and renal ultrasound performed 12/04/2006  FINDINGS: Gallbladder:  No gallstones or wall thickening visualized. A small 5 mm polyp is incidentally noted near the base of the gallbladder. No sonographic Murphy sign noted.  Common bile duct:  Diameter: 0.2 cm, within normal limits in caliber.  Liver:  No focal lesion identified. Within normal limits in parenchymal echogenicity.  IVC:  No abnormality visualized.  Pancreas:  Visualized portion unremarkable.  Spleen:  Size and appearance within normal limits.  Right Kidney:  Length: 10.5 cm. Echogenicity within normal limits. No mass or hydronephrosis visualized. A 5 mm stone is noted at the interpole region of the right kidney.  Left Kidney:  Length: 10.5 cm. Echogenicity within normal limits. No mass or hydronephrosis visualized.  Abdominal aorta:  No aneurysm visualized.  Other findings:  None.  IMPRESSION: 1. No acute abnormality seen in the abdomen. 2. 5 mm polyp noted near the base of the gallbladder; gallbladder otherwise unremarkable in appearance. 3. 5 mm nonobstructing stone noted at the interpole region of the right kidney.   Electronically Signed   By: Roanna Raider M.D.   On: 2014/04/18 01:17   US Abdomen Limited  03/27/2014   CLINICAL DATA:  PAIN.  EXAM: US ABDOMEN LIMITED - RIGHT UPPER QUADRANT  COMPARISON:  DG ABD ACUTE W/CHEST dated 03/27/2014  FINDINGS:  Gallbladder:  A 5 mm polyp is noted in the gallbladder. Gallbladder wall thickness normal. Negative Murphy sign. No gallstones. No pericholecystic fluid collection.  Common bile duct:  Diameter: 2.2 mm  Liver:  No focal lesion identified. Within normal limits in parenchymal echogenicity. Limited views of the pancreas reveal no focal abnormality.  IMPRESSION: 5 mm polyp in the gallbladder, exam otherwise negative.   Electronically Signed   By: Maisie Fus  Register   On: 03/27/2014 11:41   Dg Abd Acute W/chest  03/27/2014   CLINICAL DATA:  Chest and abdominal pain; nausea and vomiting  EXAM: ACUTE ABDOMEN SERIES (ABDOMEN 2 VIEW & CHEST 1 VIEW)  COMPARISON:  Abdomen radiograph April 12, 2012  FINDINGS: PA chest: Lungs are clear. Heart size and pulmonary vascularity are normal. No adenopathy. There is upper lumbar levoscoliosis.  Supine and upright abdomen: There is moderate stool in the colon. Bowel gas pattern is unremarkable. No  obstruction or free air. There is a 5 mm calculus with a nearby 2 mm calculus in the lower pole the left kidney. There are phleboliths in the pelvis. Liver appears prominent. There is lumbar levoscoliosis.  IMPRESSION: Bowel gas pattern unremarkable. Calculi in lower pole left kidney. Lungs clear.   Electronically Signed   By: Bretta BangWilliam  Woodruff M.D.   On: 03/27/2014 08:41    Medications:    . nicotine  14 mg Transdermal Daily  . pantoprazole  20 mg Oral Daily  . potassium chloride SA  40 mEq Oral Once   Continuous Infusions:   Time spent: 35 minutes with > 50% of time discussing current diagnostic test results, clinical impression and plan of care with the patient's mother.    LOS: 2 days   [name]  [facility] Pager [phone]. If unable to reach me by pager, please call my cell phone at [phone].  *Please refer to [email or web address], password [email or web address] to get updated schedule on who will round on this patient, as hospitalists switch teams weekly. If 7PM-7AM, please  contact night-coverage at [email or web address], password [email or web address] for any overnight needs.  03/29/2014, 8:14 AM    **Disclaimer: This note was dictated with voice recognition software. Similar sounding words can inadvertently be transcribed and this note may contain transcription errors which may not have been corrected upon publication of note.**       In an effort to keep you and your family informed about your hospital stay, I am providing you with this information sheet. If you or your family have any questions, please do not hesitate to have the nursing staff page me to set up a meeting time.  Gretta CoolChelsea R Early 03/29/2014 2 (Number of days in the hospital)  Treatment team:  Dr. Hillery Aldohristina Bradan Congrove, Hospitalist (Internist)  Dr. Melvia Heapsobert Kaplan (Gastroenterologist)  Active Treatment Issues with Plan: Principal Problem:   Acute inflammation of the liver This can be caused by infections, autoimmune problems or the toxic effects of alcohol/drugs or combination of these things. Your liver function tests are still rising except for the bilirubin level which is slightly lower today. A measure of how well your blood is able to form clots, which is dependent on factors made by the liver, has improved today. So far, tests looking for the possibility of infection are negative, but all the tests are not yet back. This does not appear to be an autoimmune problem (ANA negative, this would be positive and suspected autoimmune disease) Active Problems:   Smoking We strongly advise you to discontinue smoking. A Nicotine patch has been ordered.   Reflux You are on a medicine called Protonix to help inhibit acid secretion in your stomach.   Heavy alcohol use We strongly urge you to avoid alcohol in the future. We will have the social worker talk with you to provide you with resources for counseling.  Anticipated discharge date: Depends on recovery of your liver function.

## 2014-03-29 NOTE — Progress Notes (Signed)
[facility] Progress Note  Subjective:  Was walking around halls yesterday and appetite was better, but having more abdominal pain again with nausea and vomiting since last evening.    Objective:  Vital signs in last 24 hours: Temp:  [97.6 F (36.4 C)-98.3 F (36.8 C)] 97.6 F (36.4 C) (04/08 0443) Pulse Rate:  [75-97] 86 (04/08 0443) Resp:  [18] 18 (04/08 0443) BP: (108-125)/(70-88) 123/85 mmHg (04/08 0443) SpO2:  [98 %-100 %] 98 % (04/08 0443) Last BM Date: 03/27/14 General:  Alert, Well-developed, in NAD Heart:  Regular rate and rhythm; no murmurs Pulm:  CTAB.  No W/R/R. Abdomen:  Soft, non-distended. Normal bowel sounds.  Upper abdominal TTP without R/R/G. Extremities:  Without edema. Neurologic:  Alert and  oriented x4;  grossly normal neurologically. Psych:  Alert and cooperative. Normal mood and affect.  Lab Results:  Recent Labs  03/27/14 0840 03/28/14 0310  WBC 9.3 10.5  HGB 14.1 12.2  HCT 39.7 34.4*  PLT 180 222   BMET  Recent Labs  03/28/14 0310 03/28/14 1510 03/29/14 0335  NA 136* 140 139  K 3.5* 3.7 3.9  CL 104 104 101  CO2 22 26 25   GLUCOSE 79 89 88  BUN 5* 4* 5*  CREATININE 0.39* 0.54 0.48*  CALCIUM 7.9* 8.4 8.5   LFT  Recent Labs  03/29/14 0335  PROT 5.8*  ALBUMIN 3.8  AST 2276*  ALT 2979*  ALKPHOS 67  BILITOT 2.9*   PT/INR  Recent Labs  03/28/14 0310 03/29/14 0335  LABPROT 24.3* 19.9*  INR 2.27* 1.75*   Hepatitis Panel  Recent Labs  03/27/14 1001  HEPBSAG NEGATIVE  HCVAB NEGATIVE  HEPAIGM NON REACTIVE  HEPBIGM NON REACTIVE    [place] Abdomen Limited  03/27/2014   CLINICAL DATA:  PAIN.  EXAM: US ABDOMEN LIMITED - RIGHT UPPER QUADRANT  COMPARISON:  DG ABD ACUTE W/CHEST dated 03/27/2014  FINDINGS: Gallbladder:  A 5 mm polyp is noted in the gallbladder. Gallbladder wall thickness normal. Negative Murphy sign. No gallstones. No pericholecystic fluid collection.  Common bile duct:  Diameter: 2.2 mm  Liver:  No focal  lesion identified. Within normal limits in parenchymal echogenicity. Limited views of the pancreas reveal no focal abnormality.  IMPRESSION: 5 mm polyp in the gallbladder, exam otherwise negative.   Electronically Signed   By: Maisie Fushomas  Register   On: 03/27/2014 11:41    Assessment / Plan: -Elevated LFT's in the setting of recent nausea, vomiting, and abdominal pain: Patient has acute hepatitis on unknown etiology; seems like a drug toxicity, but she continues to deny any other ingestions.  INR is improved and bili is slowly trending down.  LFT's came down a bit but are up again.  Tylenol level, salicylate level, EBV, ANA, and viral hepatitis panel negative/normal.  -History of major depression and suicide attempt several years ago   *Monitor LFT's, coags, and mental status.  Ok to continue monitoring here for now, however, if there should be any clinical deterioration, or if coagulopathy or LFTs worsen, would consider transfer to a liver transplant unit.  *Awaiting CMV results.   LOS: 2 days   Princella PellegriniJessica D. Zehr  03/29/2014, 8:49 AM  Pager number [phone]  GI Attending Note  I have personally taken an interval history, reviewed the chart, and examined the patient.  I agree with the extender's note, impression and recommendations.  Barbette Hairobert D. Arlyce DiceKaplan, MD, [facility] [phone]

## 2014-03-29 NOTE — Progress Notes (Signed)
Clinical Social Work  CSW received referral in order to assess substance use. CSW went to room where patient, friend, and mom present. Patient laying in bed holding her mom's hand. Patient states she is nauseous and in pain and does not want to talk at this time. CSW agreeable to follow up at later time to complete full assessment.  [place]olly Bela Bon[place]arte, [place] [phone]

## 2014-03-30 ENCOUNTER — Encounter (HOSPITAL_COMMUNITY): Payer: Self-pay | Admitting: Radiology

## 2014-03-30 DIAGNOSIS — F1020 Alcohol dependence, uncomplicated: Secondary | ICD-10-CM | POA: Diagnosis present

## 2014-03-30 LAB — COMPREHENSIVE METABOLIC PANEL
ALK PHOS: 87 U/L (ref 39–117)
ALT: 9696 U/L — ABNORMAL HIGH (ref 0–35)
ALT: 6981 U/L — AB (ref 0–35)
AST: 7776 U/L — ABNORMAL HIGH (ref 0–37)
AST: 4043 U/L — AB (ref 0–37)
Albumin: 3.9 g/dL (ref 3.5–5.2)
Albumin: 4.4 g/dL (ref 3.5–5.2)
Alkaline Phosphatase: 80 U/L (ref 39–117)
BILIRUBIN TOTAL: 1.9 mg/dL — AB (ref 0.3–1.2)
BUN: 9 mg/dL (ref 6–23)
BUN: 10 mg/dL (ref 6–23)
CHLORIDE: 92 meq/L — AB (ref 96–112)
CO2: 27 mEq/L (ref 19–32)
CO2: 23 meq/L (ref 19–32)
CREATININE: 0.64 mg/dL (ref 0.50–1.10)
Calcium: 9.1 mg/dL (ref 8.4–10.5)
Calcium: 9.3 mg/dL (ref 8.4–10.5)
Chloride: 97 mEq/L (ref 96–112)
Creatinine, Ser: 0.56 mg/dL (ref 0.50–1.10)
GFR calc Af Amer: >90 mL/min (ref >90)
GFR calc non Af Amer: >90 mL/min (ref >90)
GLUCOSE: 89 mg/dL (ref 70–99)
Glucose, Bld: 140 mg/dL — ABNORMAL HIGH (ref 70–99)
POTASSIUM: 3.7 meq/L (ref 3.7–5.3)
Potassium: 3.9 mEq/L (ref 3.7–5.3)
SODIUM: 134 meq/L — AB (ref 137–147)
Sodium: 138 mEq/L (ref 137–147)
TOTAL PROTEIN: 6.3 g/dL (ref 6.0–8.3)
Total Bilirubin: 1.6 mg/dL — ABNORMAL HIGH (ref 0.3–1.2)
Total Protein: 7.2 g/dL (ref 6.0–8.3)

## 2014-03-30 LAB — PROTIME-INR
INR: 1.64 — ABNORMAL HIGH (ref 0.00–1.49)
Prothrombin Time: 19 seconds — ABNORMAL HIGH (ref 11.6–15.2)

## 2014-03-30 LAB — CYTOMEGALOVIRUS PCR, QUALITATIVE: CYTOMEGALOVIRUS DNA: NOT DETECTED

## 2014-03-30 MED ORDER — METHYLPREDNISOLONE SODIUM SUCC 125 MG IJ SOLR
50.0000 mg | Freq: Every day | INTRAMUSCULAR | Status: DC
  Administered 2014-03-31 – 2014-04-01 (×2): 50 mg via INTRAVENOUS
  Filled 2014-03-30 (×2): qty 0.8

## 2014-03-30 MED ORDER — METHYLPREDNISOLONE SODIUM SUCC 125 MG IJ SOLR
50.0000 mg | Freq: Once | INTRAMUSCULAR | Status: AC
  Administered 2014-03-30: 50 mg via INTRAVENOUS
  Filled 2014-03-30: qty 0.8

## 2014-03-30 MED ORDER — VITAMIN K1 10 MG/ML IJ SOLN
10.0000 mg | Freq: Once | INTRAVENOUS | Status: AC
  Administered 2014-03-30: 10 mg via INTRAVENOUS
  Filled 2014-03-30: qty 1

## 2014-03-30 MED ORDER — METHYLPREDNISOLONE SODIUM SUCC 125 MG IJ SOLR
60.0000 mg | Freq: Once | INTRAMUSCULAR | Status: DC
  Filled 2014-03-30: qty 0.96

## 2014-03-30 NOTE — Progress Notes (Signed)
Lab called with corrective report to notify of correct lab results. Actual result AST 7776. ALT 9696.

## 2014-03-30 NOTE — H&P (Signed)
Chief Complaint: "Abdominal pain and abnormal labs." Referring Physician: Alonza Bogus PA-C HPI: [name] is an 28 y.o. female with complaints of RUQ abdominal pain and N/V with weight loss x 1 week. She states she does consume alcohol in large amount at certain times then will not drink alcohol for a week or so. She denies any illicit drug use. She denies any new medications. 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 The patient was found to have elevated LFT's, Tylenol level, salicylate level, EBV, ANA, and viral hepatitis panel negative/normal. IR received request for image guided random liver biopsy. She denies any chest pain, shortness of breath or palpitations. She denies any blood in her stool or urine. She denies any recent fever or chills. The patient denies any history of sleep apnea or chronic oxygen use. She denies any loose stools. She denies any known complications to sedation.   Past Medical History:  Past Medical History  Diagnosis Date  . Endometriosis   . Lumbar facet arthropathy   . UTI (lower urinary tract infection)   . Right ureteral stone   . History of suicide attempt WRIST CUTTINE    PT STATES LAST TIME AGE 72--  NO ATTEMPTS SINCE--  RECEIVED COUNSELING  . History of alcohol abuse AS TEEN  . OA (osteoarthritis of spine) LUMBAR --  PER PT  . History of major depression AGE 89-  SUICIDE ATTEMPT    PT STATES NO PROBLEMS SINCE  . CIN III (cervical intraepithelial neoplasia III)   . Acid reflux OCCASIONAL  . Urgency of urination   . Frequency of urination   . Nocturia   . Hematuria   . History of kidney stones     Past Surgical History:  Past Surgical History  Procedure Laterality Date  . Cervical biopsy  w/ loop electrode excision  FEB  2013    CIN III  (GYN OFFICE)  . Wisdom tooth extraction  2012    ORAL SURGEON OFFICE  . Orif right little finger fx  2006  . Cystoscopy with ureteroscopy  04/20/2012    Procedure: CYSTOSCOPY WITH  URETEROSCOPY;  Surgeon: Fredricka Bonine, MD;  Location: [facility];  Service: Urology;  Laterality: N/A;  CYSTO, RIGHT URETEROSCOPY ,  STONE BASKET EXTRACTION   CARM LASER      Family History:  Family History  Problem Relation Age of Onset  . Cancer Mother   . Heart failure Father   . Hepatitis      Autoimmune treated with prednisone    Social History:  reports that she has been smoking Cigarettes.  She has a 4 pack-year smoking history. She has never used smokeless tobacco. She reports that she drinks alcohol. She reports that she does not use illicit drugs.  Allergies:  Allergies  Allergen Reactions  . Codeine Itching    Medications:   Medication List    ASK your doctor about these medications       acetaminophen 500 MG tablet  Commonly known as:  TYLENOL  Take 500 mg by mouth every 6 (six) hours as needed for mild pain.     ALPRAZolam 1 MG tablet  Commonly known as:  XANAX  Take 1 mg by mouth 2 (two) times daily as needed for anxiety.     lansoprazole 30 MG capsule  Commonly known as:  PREVACID  Take 1 capsule (30 mg total) by mouth daily at 12 noon.     ondansetron 4 MG disintegrating  tablet  Commonly known as:  ZOFRAN-ODT  Take 4 mg by mouth every 4 (four) hours as needed for nausea or vomiting.       Please HPI for pertinent positives, otherwise complete 10 system ROS negative.  Physical Exam: BP 125/79  Pulse 89  Temp(Src) 98.3 F (36.8 C) (Oral)  Resp 16  Ht '5\' 1"'  (1.549 m)  Wt 100 lb (45.36 kg)  BMI 18.90 kg/m2  SpO2 98%  LMP 03/12/2014 Body mass index is 18.9 kg/(m^2).  General Appearance:  Alert, cooperative, no distress  Head:  Normocephalic, without obvious abnormality, atraumatic  Neck: Supple, symmetrical, trachea midline  Lungs:   Clear to auscultation bilaterally, no w/r/r, respirations unlabored without use of accessory muscles.  Chest Wall:  No tenderness or deformity  Heart:  Regular rate and rhythm, S1, S2  normal, no murmur, rub or gallop.  Abdomen:   Soft, RUQ tenderness, non distended, (+) BS  Extremities: Extremities normal, atraumatic, no cyanosis or edema  Pulses: 2+ and symmetric  Neurologic: Normal affect, no gross deficits.   Results for orders placed during the hospital encounter of 03/27/14 (from the past 48 hour(s))  PROTIME-INR     Status: Abnormal   Collection Time    03/29/14  3:35 AM      Result Value Ref Range   Prothrombin Time 19.9 (*) 11.6 - 15.2 seconds   INR 1.75 (*) 0.00 - 1.49  COMPREHENSIVE METABOLIC PANEL     Status: Abnormal   Collection Time    03/29/14  3:35 AM      Result Value Ref Range   Sodium 139  137 - 147 mEq/L   Potassium 3.9  3.7 - 5.3 mEq/L   Chloride 101  96 - 112 mEq/L   CO2 25  19 - 32 mEq/L   Glucose, Bld 88  70 - 99 mg/dL   BUN 5 (*) 6 - 23 mg/dL   Creatinine, Ser 0.48 (*) 0.50 - 1.10 mg/dL   Calcium 8.5  8.4 - 10.5 mg/dL   Total Protein 5.8 (*) 6.0 - 8.3 g/dL   Albumin 3.8  3.5 - 5.2 g/dL   AST 2276 (*) 0 - 37 U/L   ALT 2979 (*) 0 - 35 U/L   Alkaline Phosphatase 67  39 - 117 U/L   Total Bilirubin 2.9 (*) 0.3 - 1.2 mg/dL   GFR calc non Af Amer >90  >90 mL/min   GFR calc Af Amer >90  >90 mL/min   Comment: (NOTE)     The eGFR has been calculated using the CKD EPI equation.     This calculation has not been validated in all clinical situations.     eGFR's persistently <90 mL/min signify possible Chronic Kidney     Disease.  COMPREHENSIVE METABOLIC PANEL     Status: Abnormal   Collection Time    03/29/14  3:44 PM      Result Value Ref Range   Sodium 136 (*) 137 - 147 mEq/L   Potassium 3.9  3.7 - 5.3 mEq/L   Chloride 98  96 - 112 mEq/L   CO2 26  19 - 32 mEq/L   Glucose, Bld 91  70 - 99 mg/dL   BUN 6  6 - 23 mg/dL   Creatinine, Ser 0.61  0.50 - 1.10 mg/dL   Calcium 9.0  8.4 - 10.5 mg/dL   Total Protein 6.1  6.0 - 8.3 g/dL   Albumin 3.6  3.5 - 5.2 g/dL  AST >10000 (*) 0 - 37 U/L   Comment: RESULTS CONFIRMED BY MANUAL DILUTION    ALT 11464 (*) 0 - 35 U/L   Comment: RESULTS CONFIRMED BY MANUAL DILUTION   Alkaline Phosphatase 74  39 - 117 U/L   Total Bilirubin 1.9 (*) 0.3 - 1.2 mg/dL   GFR calc non Af Amer >90  >90 mL/min   GFR calc Af Amer >90  >90 mL/min   Comment: (NOTE)     The eGFR has been calculated using the CKD EPI equation.     This calculation has not been validated in all clinical situations.     eGFR's persistently <90 mL/min signify possible Chronic Kidney     Disease.  COMPREHENSIVE METABOLIC PANEL     Status: Abnormal   Collection Time    03/30/14  3:32 AM      Result Value Ref Range   Sodium 138  137 - 147 mEq/L   Potassium 3.9  3.7 - 5.3 mEq/L   Chloride 97  96 - 112 mEq/L   CO2 27  19 - 32 mEq/L   Glucose, Bld 89  70 - 99 mg/dL   BUN 9  6 - 23 mg/dL   Creatinine, Ser 0.64  0.50 - 1.10 mg/dL   Calcium 9.1  8.4 - 10.5 mg/dL   Total Protein 6.3  6.0 - 8.3 g/dL   Albumin 3.9  3.5 - 5.2 g/dL   AST 7776 (*) 0 - 37 U/L   Comment: RESULTS CONFIRMED BY MANUAL DILUTION     CORRECTED RESULTS CALLED TO:     MAYFIELD,N RN AT 0646 04.09.15 BY TIBBITTS,K     CORRECTED ON 04/09 AT 0645: PREVIOUSLY REPORTED AS 3888   ALT 9696 (*) 0 - 35 U/L   Comment: RESULTS CONFIRMED BY MANUAL DILUTION     CORRECTED RESULTS CALLED TO:     MAYFIELD,N RN AT 3142 04.09.15 BY TIBBITTS,K     CORRECTED ON 04/09 AT 0645: PREVIOUSLY REPORTED AS 4848   Alkaline Phosphatase 80  39 - 117 U/L   Total Bilirubin 1.6 (*) 0.3 - 1.2 mg/dL   GFR calc non Af Amer >90  >90 mL/min   GFR calc Af Amer >90  >90 mL/min   Comment: (NOTE)     The eGFR has been calculated using the CKD EPI equation.     This calculation has not been validated in all clinical situations.     eGFR's persistently <90 mL/min signify possible Chronic Kidney     Disease.  PROTIME-INR     Status: Abnormal   Collection Time    03/30/14 11:18 AM      Result Value Ref Range   Prothrombin Time 19.0 (*) 11.6 - 15.2 seconds   INR 1.64 (*) 0.00 - 1.49   No results  found.  Assessment/Plan Elevated LFT'S Abdominal pain Nausea, vomiting.  Request for image guided random liver biopsy. Patient will be NPO after midnight, labs ordered for am and INR must be < 1.5 for procedure.  Risks and Benefits discussed with the patient. All of the patient's questions were answered, patient is agreeable to proceed. Consent signed and in chart.   Stephanie Jacob PA-C 03/30/2014, 3:33 PM

## 2014-03-30 NOTE — Progress Notes (Addendum)
Progress Note   [name] KGM:[id number] DOB: [birth year] DOA: 03/27/2014 PCP: Roxy Manns, MD   Brief Narrative:    [name] is an 28 y.o. female with a PMH of chronic ethanol use, endometriosis, history of ureteral stones, GERD, prior suicide attempt who initially presented to the ER on 03/24/14 with complaints of upper abdominal pain and nonbilious vomiting. LFTs were minimally elevated on that visit.  She subsequently was discharged home and instructed to followup with gastroenterology. She returned to the ER on 03/27/14 with persistent symptoms, and was noted to have marked elevation of her LFTs.   Assessment/Plan:    Principal Problem:   Acute hepatitis The patient has rising LFTs in the setting of recent nausea, vomiting, abdominal pain, and alcohol abuse. Currently, the etiology is not definitively known. No elevation of acetaminophen level. No elevation of salicylates. EBV and viral hepatitis panel negative. NAC protocol discontinued in light of normal acetaminophen levels.  The patient's mother has a history of autoimmune hepatitis, but the patient's ANA is negative. CMV studies pending. May need transfer to a tertiary care center if the patient deteriorates clinically. LFTs significantly elevated with AST/ALT reaching maximal values yesterday afternoon, but now beginning to trend down. Bilirubin also trending down. We'll speak with GI about starting empiric steroids in case her ANA is a false-negative, given her mothers history of autoimmune hepatitis. Will add additional autoantibody testing and give a dose of Solumedrol now.    From uptodate re autoimmune hepatitis:    The diagnosis should be made in patients with compatible clinical signs, symptoms, and laboratory abnormalities. Compatible laboratory and histological abnormalities include abnormal liver biochemical tests, increased total IgG or gamma-globulin levels, serologic markers (antinuclear antibodies [ANA],  antismooth muscle antibodies [ASMA], anti-liver-kidney microsome-1 antibodies [anti-LKM-1], or anti-liver cytosol antibody-1 [anti-LC1]), and interface hepatitis. Other conditions that can cause chronic hepatitis should be excluded.  In those who are negative for conventional autoantibodies, additional autoantibodies should be sought. At minimum, this should include anti-soluble liver/liver pancreas antigen (anti-SLA/LP) and atypical perinuclear antineutrophil cytoplasmic antibodies (pANCA).  Active Problems:   ANXIETY Patient normally takes Xanax 1 mg twice a day.   TOBACCO ABUSE Counsel. Nicotine patch ordered.   GERD Continue Protonix 20 mg daily.   ALCOHOL ABUSE, HX OF Doubt she would be a candidate for liver transplantation given her history of alcohol abuse, although she appears to be more of a binge drinker. Social worker consultation for substance abuse counseling will be requested.   DVT Prophylaxis Continue SCDs.  Code Status: Full. Family Communication: [name], mother, updated at bedside. Disposition Plan: Home when stable.   IV access:  Peripheral IV  Procedures  None.  Medical Consultants:  Dr. Melvia Heaps, Gastroenterology  Other Consultants:  None.  Anti-infectives:  None.  Subjective:    [name] is more awake and alert this morning. She continues to have some nausea and vomiting. No complaints of diarrhea. Still has right upper quadrant abdominal pain. No tremulousness or signs of alcohol withdrawal.  Objective:    Filed Vitals:   03/29/14 0443 03/29/14 1500 03/29/14 2159 03/30/14 0501  BP: 123/85 118/85 116/76 115/64  Pulse: 86 78 79 74  Temp: 97.6 F (36.4 C) 98.5 F (36.9 C) 98.4 F (36.9 C) 98.3 F (36.8 C)  TempSrc: Oral Oral Oral Oral  Resp: 18 16 15 16   Height:      Weight:      SpO2: 98% 98% 97% 95%   No  intake or output data in the 24 hours ending 03/30/14 0805  Exam: Gen:  NAD Cardiovascular:  RRR, No  M/R/G Respiratory:  Lungs CTAB Gastrointestinal:  Abdomen soft, tender right upper quadrant, + BS Extremities:  No C/E/C   Data Reviewed:    Labs: Basic Metabolic Panel:  Recent Labs Lab 03/28/14 0310 03/28/14 1510 03/29/14 0335 03/29/14 1544 03/30/14 0332  NA 136* 140 139 136* 138  K 3.5* 3.7 3.9 3.9 3.9  CL 104 104 101 98 97  CO2 22 26 25 26 27   GLUCOSE 79 89 88 91 89  BUN 5* 4* 5* 6 9  CREATININE 0.39* 0.54 0.48* 0.61 0.64  CALCIUM 7.9* 8.4 8.5 9.0 9.1   GFR Estimated Creatinine Clearance: 75.7 ml/min (by C-G formula based on Cr of 0.64). Liver Function Tests:  Recent Labs Lab 03/28/14 0310 03/28/14 1510 03/29/14 0335 03/29/14 1544 03/30/14 0332  AST 2696* 2002* 2276* >10000* 7776*  ALT 2410* 2339* 2979* 11976* 9696*  ALKPHOS 51 62 67 74 80  BILITOT 3.1* 3.0* 2.9* 1.9* 1.6*  PROT 5.1* 5.6* 5.8* 6.1 6.3  ALBUMIN 3.2* 3.5 3.8 3.6 3.9    Recent Labs Lab 03/24/14 2340 03/27/14 0840  LIPASE 17 24   Coagulation profile  Recent Labs Lab 03/27/14 1224 03/28/14 0310 03/29/14 0335  INR 1.67* 2.27* 1.75*    CBC:  Recent Labs Lab 03/24/14 2340 03/27/14 0840 03/28/14 0310  WBC 17.3* 9.3 10.5  NEUTROABS 14.7* 7.2  --   HGB 14.7 14.1 12.2  HCT 42.9 39.7 34.4*  MCV 96.4 93.0 93.2  PLT 347 180 222   Cardiac Enzymes:  Recent Labs Lab 03/27/14 1001  CKTOTAL 86   Sepsis Labs:  Recent Labs Lab 03/24/14 2340 03/27/14 0840 03/27/14 1239 03/28/14 0310  WBC 17.3* 9.3  --  10.5  LATICACIDVEN  --   --  0.73  --    Microbiology No results found for this or any previous visit (from the past 240 hour(s)).   Radiographs/Studies:    US Abdomen Complete  04-21-2014   CLINICAL DATA:  Abdominal pain, nausea and vomiting.  EXAM: ULTRASOUND ABDOMEN COMPLETE  COMPARISON:  CT of the abdomen and pelvis performed 04/15/2012, and renal ultrasound performed 12/04/2006  FINDINGS: Gallbladder:  No gallstones or wall thickening visualized. A small 5 mm polyp  is incidentally noted near the base of the gallbladder. No sonographic Murphy sign noted.  Common bile duct:  Diameter: 0.2 cm, within normal limits in caliber.  Liver:  No focal lesion identified. Within normal limits in parenchymal echogenicity.  IVC:  No abnormality visualized.  Pancreas:  Visualized portion unremarkable.  Spleen:  Size and appearance within normal limits.  Right Kidney:  Length: 10.5 cm. Echogenicity within normal limits. No mass or hydronephrosis visualized. A 5 mm stone is noted at the interpole region of the right kidney.  Left Kidney:  Length: 10.5 cm. Echogenicity within normal limits. No mass or hydronephrosis visualized.  Abdominal aorta:  No aneurysm visualized.  Other findings:  None.  IMPRESSION: 1. No acute abnormality seen in the abdomen. 2. 5 mm polyp noted near the base of the gallbladder; gallbladder otherwise unremarkable in appearance. 3. 5 mm nonobstructing stone noted at the interpole region of the right kidney.   Electronically Signed   By: Roanna Raider M.D.   On: Apr 21, 2014 01:17   US Abdomen Limited  03/27/2014   CLINICAL DATA:  PAIN.  EXAM: US ABDOMEN LIMITED - RIGHT UPPER QUADRANT  COMPARISON:  DG  ABD ACUTE W/CHEST dated 03/27/2014  FINDINGS: Gallbladder:  A 5 mm polyp is noted in the gallbladder. Gallbladder wall thickness normal. Negative Murphy sign. No gallstones. No pericholecystic fluid collection.  Common bile duct:  Diameter: 2.2 mm  Liver:  No focal lesion identified. Within normal limits in parenchymal echogenicity. Limited views of the pancreas reveal no focal abnormality.  IMPRESSION: 5 mm polyp in the gallbladder, exam otherwise negative.   Electronically Signed   By: Maisie Fushomas  Register   On: 03/27/2014 11:41   Dg Abd Acute W/chest  03/27/2014   CLINICAL DATA:  Chest and abdominal pain; nausea and vomiting  EXAM: ACUTE ABDOMEN SERIES (ABDOMEN 2 VIEW & CHEST 1 VIEW)  COMPARISON:  Abdomen radiograph April 12, 2012  FINDINGS: PA chest: Lungs are clear. Heart  size and pulmonary vascularity are normal. No adenopathy. There is upper lumbar levoscoliosis.  Supine and upright abdomen: There is moderate stool in the colon. Bowel gas pattern is unremarkable. No obstruction or free air. There is a 5 mm calculus with a nearby 2 mm calculus in the lower pole the left kidney. There are phleboliths in the pelvis. Liver appears prominent. There is lumbar levoscoliosis.  IMPRESSION: Bowel gas pattern unremarkable. Calculi in lower pole left kidney. Lungs clear.   Electronically Signed   By: Bretta BangWilliam  Woodruff M.D.   On: 03/27/2014 08:41    Medications:    . nicotine  14 mg Transdermal Daily  . pantoprazole  20 mg Oral Daily  . potassium chloride SA  40 mEq Oral Once   Continuous Infusions:   Time spent: 35 minutes with > 50% of time discussing current diagnostic test results, clinical impression and plan of care with the patient and her mother and coordination of care with gastroenterology.    LOS: 3 days   Maryruth Bunhristina P Rody Keadle  [facility] Pager [phone]. If unable to reach me by pager, please call my cell phone at [phone].  *Please refer to [email or web address], password [email or web address] to get updated schedule on who will round on this patient, as hospitalists switch teams weekly. If 7PM-7AM, please contact night-coverage at [email or web address], password [email or web address] for any overnight needs.  03/30/2014, 8:05 AM    **Disclaimer: This note was dictated with voice recognition software. Similar sounding words can inadvertently be transcribed and this note may contain transcription errors which may not have been corrected upon publication of note.**
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
## 2014-03-31 ENCOUNTER — Inpatient Hospital Stay (HOSPITAL_COMMUNITY): Payer: Self-pay

## 2014-03-31 ENCOUNTER — Inpatient Hospital Stay (HOSPITAL_COMMUNITY): Payer: 59

## 2014-03-31 ENCOUNTER — Other Ambulatory Visit: Payer: Self-pay | Admitting: Radiology

## 2014-03-31 DIAGNOSIS — F411 Generalized anxiety disorder: Secondary | ICD-10-CM

## 2014-03-31 DIAGNOSIS — F1020 Alcohol dependence, uncomplicated: Secondary | ICD-10-CM

## 2014-03-31 LAB — COMPREHENSIVE METABOLIC PANEL
ALK PHOS: 77 U/L (ref 39–117)
ALT: 5399 U/L — ABNORMAL HIGH (ref 0–35)
ALT: 4591 U/L — ABNORMAL HIGH (ref 0–35)
AST: 1475 U/L — AB (ref 0–37)
AST: 801 U/L — AB (ref 0–37)
Albumin: 3.6 g/dL (ref 3.5–5.2)
Albumin: 3.8 g/dL (ref 3.5–5.2)
Alkaline Phosphatase: 74 U/L (ref 39–117)
BILIRUBIN TOTAL: 1.5 mg/dL — AB (ref 0.3–1.2)
BUN: 11 mg/dL (ref 6–23)
BUN: 12 mg/dL (ref 6–23)
CHLORIDE: 98 meq/L (ref 96–112)
CO2: 28 meq/L (ref 19–32)
CO2: 28 mEq/L (ref 19–32)
Calcium: 9.1 mg/dL (ref 8.4–10.5)
Calcium: 9.5 mg/dL (ref 8.4–10.5)
Chloride: 97 mEq/L (ref 96–112)
Creatinine, Ser: 0.6 mg/dL (ref 0.50–1.10)
Creatinine, Ser: 0.57 mg/dL (ref 0.50–1.10)
GFR calc Af Amer: >90 mL/min (ref >90)
GFR calc Af Amer: >90 mL/min (ref >90)
GFR calc non Af Amer: >90 mL/min (ref >90)
Glucose, Bld: 173 mg/dL — ABNORMAL HIGH (ref 70–99)
Glucose, Bld: 180 mg/dL — ABNORMAL HIGH (ref 70–99)
Potassium: 3.9 mEq/L (ref 3.7–5.3)
Potassium: 4 mEq/L (ref 3.7–5.3)
Sodium: 136 mEq/L — ABNORMAL LOW (ref 137–147)
Sodium: 137 mEq/L (ref 137–147)
Total Bilirubin: 1.4 mg/dL — ABNORMAL HIGH (ref 0.3–1.2)
Total Protein: 5.9 g/dL — ABNORMAL LOW (ref 6.0–8.3)
Total Protein: 6.5 g/dL (ref 6.0–8.3)

## 2014-03-31 LAB — CBC
HEMATOCRIT: 34.7 % — AB (ref 36.0–46.0)
Hemoglobin: 12.4 g/dL (ref 12.0–15.0)
MCH: 32.7 pg (ref 26.0–34.0)
MCHC: 35.7 g/dL (ref 30.0–36.0)
MCV: 91.6 fL (ref 78.0–100.0)
Platelets: 224 10*3/uL (ref 150–400)
RBC: 3.79 MIL/uL — ABNORMAL LOW (ref 3.87–5.11)
RDW: 12.4 % (ref 11.5–15.5)
WBC: 9.5 10*3/uL (ref 4.0–10.5)

## 2014-03-31 LAB — FERRITIN: Ferritin: 2938 ng/mL — ABNORMAL HIGH (ref 10–291)

## 2014-03-31 LAB — ANCA SCREEN W REFLEX TITER
ATYPICAL P-ANCA SCREEN: NEGATIVE
P-ANCA SCREEN: NEGATIVE
c-ANCA Screen: NEGATIVE

## 2014-03-31 LAB — PROTIME-INR
INR: 1.33 (ref 0.00–1.49)
INR: 1.23 (ref 0.00–1.49)
Prothrombin Time: 16.2 seconds — ABNORMAL HIGH (ref 11.6–15.2)
Prothrombin Time: 15.2 seconds (ref 11.6–15.2)

## 2014-03-31 LAB — IRON AND TIBC
Iron: 72 ug/dL (ref 42–135)
SATURATION RATIOS: 26 % (ref 20–55)
TIBC: 281 ug/dL (ref 250–470)
UIBC: 209 ug/dL (ref 125–400)

## 2014-03-31 LAB — ANTI-SMOOTH MUSCLE ANTIBODY, IGG: F-ACTIN AB IGG: 6 U (ref <20)

## 2014-03-31 LAB — IGG: IgG (Immunoglobin G), Serum: 1010 mg/dL (ref 690–1700)

## 2014-03-31 LAB — MPO/PR-3 (ANCA) ANTIBODIES
Myeloperoxidase Abs: <1
Serine Protease 3: <1

## 2014-03-31 LAB — TRANSFERRIN: Transferrin: 215 mg/dL (ref 200–360)

## 2014-03-31 MED ORDER — FENTANYL CITRATE 0.05 MG/ML IJ SOLN
INTRAMUSCULAR | Status: AC | PRN
  Administered 2014-03-31 (×3): 25 ug via INTRAVENOUS

## 2014-03-31 MED ORDER — MIDAZOLAM HCL 2 MG/2ML IJ SOLN
INTRAMUSCULAR | Status: AC
  Filled 2014-03-31: qty 6

## 2014-03-31 MED ORDER — MIDAZOLAM HCL 2 MG/2ML IJ SOLN
INTRAMUSCULAR | Status: AC | PRN
  Administered 2014-03-31 (×3): 1 mg via INTRAVENOUS

## 2014-03-31 MED ORDER — FENTANYL CITRATE 0.05 MG/ML IJ SOLN
INTRAMUSCULAR | Status: AC
  Filled 2014-03-31: qty 6

## 2014-03-31 MED ORDER — TEMAZEPAM 7.5 MG PO CAPS
7.5000 mg | ORAL_CAPSULE | Freq: Every evening | ORAL | Status: DC | PRN
  Administered 2014-03-31: 7.5 mg via ORAL
  Filled 2014-03-31: qty 1

## 2014-03-31 MED ORDER — MIDAZOLAM HCL 2 MG/2ML IJ SOLN
INTRAMUSCULAR | Status: AC
  Filled 2014-03-31: qty 4

## 2014-03-31 NOTE — H&P (Signed)
Agree.  For liver core biopsy today. 

## 2014-03-31 NOTE — Procedures (Signed)
Procedure:  Ultrasound guided core biopsy of liver Findings:  18 G core biopsy x 2 via 17 G needle in right lobe of liver No immediate complications.

## 2014-03-31 NOTE — Progress Notes (Signed)
CSW attempted to follow up regarding referral in order to assess substance use.  Pt currently in procedure.  CSW will continue to follow up in attempts to assess for current substance use.  Loletta SpecterSuzanna Kidd, MSW, LCSW Clinical Social Work [phone]

## 2014-03-31 NOTE — Progress Notes (Signed)
[facility] Progress Note  Subjective:  Feeling a little better today.  Tearful because she has not been able to work and is worried about financial issues.  Objective:  Vital signs in last 24 hours: Temp:  [97.8 F (36.6 C)-98.6 F (37 C)] 97.8 F (36.6 C) (04/10 0539) Pulse Rate:  [79-89] 79 (04/10 0539) Resp:  [16] 16 (04/10 0539) BP: (112-125)/(63-79) 112/63 mmHg (04/10 0539) SpO2:  [98 %-99 %] 98 % (04/10 0539) Last BM Date: 03/27/14 General:  Alert, Well-developed, in NAD Heart:  Regular rate and rhythm; no murmurs Pulm:  CTAB.  No W/R/R. Abdomen:  Soft, non-distended. Normal bowel sounds.  Upper abdominal TTP. Extremities:  Without edema. Neurologic:  Alert and  oriented x4;  grossly normal neurologically. Psych:  Alert and cooperative. Normal mood and affect.  Intake/Output from previous day: 04/09 0701 - 04/10 0700 In: 240 [P.O.:240] Out: -   Lab Results:  Recent Labs  03/31/14 0416  WBC 9.5  HGB 12.4  HCT 34.7*  PLT 224   BMET  Recent Labs  03/30/14 0332 03/30/14 1603 03/31/14 0332  NA 138 134* 136*  K 3.9 3.7 3.9  CL 97 92* 98  CO2 27 23 28   GLUCOSE 89 140* 173*  BUN 9 10 11   CREATININE 0.64 0.56 0.60  CALCIUM 9.1 9.3 9.1   LFT  Recent Labs  03/31/14 0332  PROT 5.9*  ALBUMIN 3.6  AST 1475*  ALT 5399*  ALKPHOS 74  BILITOT 1.5*   PT/INR  Recent Labs  03/30/14 1118 03/31/14 0416  LABPROT 19.0* 16.2*  INR 1.64* 1.33   Assessment / Plan: -Elevated LFT's in the setting of recent nausea, vomiting, and abdominal pain: Patient has acute hepatitis on unknown etiology; seems like a drug toxicity, but she continues to deny any other ingestions. INR is improved and bili is slowly trending down.  She received a dose of IV solu-medrol 60 mg yesterday and LFT's down quite a bit this AM.  Tylenol level, salicylate level, EBV, CMV, ANA, and viral hepatitis panel negative/normal.  -History of major depression and suicide attempt  several years ago   *Monitor LFT's, coags, and mental status.  *Awaiting liver biopsy today. *Other liver serologies ordered and are still pending. *Will order restoril 7.5 mg to take at bedtime for sleep if needed.  Spoke with pharmacy and this would be the safest medication with her liver issues.    LOS: 4 days   Princella PellegriniJessica D. Zehr  03/31/2014, 9:00 AM  GI Attending Note  I have personally taken an interval history, reviewed the chart, and examined the patient. Overall looks a bit better.  LFTs are stablized and improved.  Underwent liver biopsy today.  Would continue solumedrol for now, follow LFTs, INR daily.  Barbette Hairobert D. Arlyce DiceKaplan, MD, [facility] [phone]   Pager number [phone]

## 2014-03-31 NOTE — Progress Notes (Signed)
Progress Note   TAWONNA ESQUER UJW:[id number] DOB: May 16, 1986 DOA: 03/27/2014 PCP: Roxy Manns, MD   Brief Narrative:    [name] is an 28 y.o. female with a PMH of chronic ethanol use, endometriosis, history of ureteral stones, GERD, prior suicide attempt who initially presented to the ER on 03/24/14 with complaints of upper abdominal pain and nonbilious vomiting. LFTs were minimally elevated on that visit.  She subsequently was discharged home and instructed to followup with gastroenterology. She returned to the ER on 03/27/14 with persistent symptoms, and was noted to have marked elevation of her LFTs.   Assessment/Plan:    Principal Problem:   Acute hepatitis The patient has rising LFTs in the setting of recent nausea, vomiting, abdominal pain, and alcohol abuse. Currently, the etiology is not definitively known. No elevation of acetaminophen level. No elevation of salicylates. EBV and viral hepatitis panel negative. NAC protocol discontinued in light of normal acetaminophen levels.  The patient's mother has a history of autoimmune hepatitis, but the patient's ANA is negative. CMV PCR negative.  Anti-smooth muscle antibodies negative.  Ferritin 2938.  ? Hemochromatosis versus acute phase reactant.  Ceruloplasmin, alpha-1 anti-trypsin,ANCAscreen, ANCA antibodies, and mitochondrial antibodies all pending. LFTs significantly elevated with AST/ALT reaching maximal values 03/29/14, but now beginning to trend down. Bilirubin also trending down. INR normalizing. Continue empiric steroids for now. For liver biopsy today. Active Problems:   ANXIETY Patient normally takes Xanax 1 mg twice a day.   TOBACCO ABUSE Counsel. Nicotine patch ordered.   GERD Continue Protonix 20 mg daily.   ALCOHOL ABUSE, HX OF Doubt she would be a candidate for liver transplantation given her history of alcohol abuse, although she appears to be more of a binge drinker. Social worker consultation for substance  abuse counseling will be requested.   DVT Prophylaxis Continue SCDs.  Code Status: Full. Family Communication: Dondra Spry, mother, updated at bedside. Disposition Plan: Home when stable.   IV access:  Peripheral IV  Procedures  None.  Medical Consultants:  Dr. Melvia Heaps, Gastroenterology  Interventional radiology  Other Consultants:  None.  Anti-infectives:  None.  Subjective:    [name] is feeling better this morning. She tells me she was able to keep without nausea or vomiting last evening. No tremulousness or signs of alcohol withdrawal. Still having occasional right upper quadrant pain.  Objective:    Filed Vitals:   03/30/14 0501 03/30/14 1430 03/30/14 2101 03/31/14 0539  BP: 115/64 125/79 115/68 112/63  Pulse: 74 89 85 79  Temp: 98.3 F (36.8 C) 98.3 F (36.8 C) 98.6 F (37 C) 97.8 F (36.6 C)  TempSrc: Oral Oral Oral Oral  Resp: 16 16 16 16   Height:      Weight:      SpO2: 95% 98% 99% 98%    Intake/Output Summary (Last 24 hours) at 03/31/14 0737 Last data filed at 03/30/14 2101  Gross per 24 hour  Intake    240 ml  Output      0 ml  Net    240 ml    Exam: Gen:  NAD Cardiovascular:  RRR, No M/R/G Respiratory:  Lungs CTAB Gastrointestinal:  Abdomen soft, tender right upper quadrant, + BS Extremities:  No C/E/C   Data Reviewed:    Labs: Basic Metabolic Panel:  Recent Labs Lab 03/29/14 0335 03/29/14 1544 03/30/14 0332 03/30/14 1603 03/31/14 0332  NA 139 136* 138 134* 136*  K 3.9 3.9 3.9 3.7 3.9  CL  101 98 97 92* 98  CO2 25 26 27 23 28   GLUCOSE 88 91 89 140* 173*  BUN 5* 6 9 10 11   CREATININE 0.48* 0.61 0.64 0.56 0.60  CALCIUM 8.5 9.0 9.1 9.3 9.1   GFR Estimated Creatinine Clearance: 75.7 ml/min (by C-G formula based on Cr of 0.6). Liver Function Tests:  Recent Labs Lab 03/29/14 0335 03/29/14 1544 03/30/14 0332 03/30/14 1603 03/31/14 0332  AST 2276* >10000* 7776* 4043* 1475*  ALT 2979* 11976* 9696*  6981* 5399*  ALKPHOS 67 74 80 87 74  BILITOT 2.9* 1.9* 1.6* 1.9* 1.5*  PROT 5.8* 6.1 6.3 7.2 5.9*  ALBUMIN 3.8 3.6 3.9 4.4 3.6    Recent Labs Lab 03/24/14 2340 03/27/14 0840  LIPASE 17 24   Coagulation profile  Recent Labs Lab 03/27/14 1224 03/28/14 0310 03/29/14 0335 03/30/14 1118 03/31/14 0416  INR 1.67* 2.27* 1.75* 1.64* 1.33    CBC:  Recent Labs Lab 03/24/14 2340 03/27/14 0840 03/28/14 0310 03/31/14 0416  WBC 17.3* 9.3 10.5 9.5  NEUTROABS 14.7* 7.2  --   --   HGB 14.7 14.1 12.2 12.4  HCT 42.9 39.7 34.4* 34.7*  MCV 96.4 93.0 93.2 91.6  PLT 347 180 222 224   Cardiac Enzymes:  Recent Labs Lab 03/27/14 1001  CKTOTAL 86   Sepsis Labs:  Recent Labs Lab 03/24/14 2340 03/27/14 0840 03/27/14 1239 03/28/14 0310 03/31/14 0416  WBC 17.3* 9.3  --  10.5 9.5  LATICACIDVEN  --   --  0.73  --   --    Microbiology No results found for this or any previous visit (from the past 240 hour(s)).   Radiographs/Studies:    Stephanie Abdomen Complete  03/25/2014   CLINICAL DATA:  Abdominal pain, nausea and vomiting.  EXAM: ULTRASOUND ABDOMEN COMPLETE  COMPARISON:  CT of the abdomen and pelvis performed 04/15/2012, and renal ultrasound performed 12/04/2006  FINDINGS: Gallbladder:  No gallstones or wall thickening visualized. A small 5 mm polyp is incidentally noted near the base of the gallbladder. No sonographic Murphy sign noted.  Common bile duct:  Diameter: 0.2 cm, within normal limits in caliber.  Liver:  No focal lesion identified. Within normal limits in parenchymal echogenicity.  IVC:  No abnormality visualized.  Pancreas:  Visualized portion unremarkable.  Spleen:  Size and appearance within normal limits.  Right Kidney:  Length: 10.5 cm. Echogenicity within normal limits. No mass or hydronephrosis visualized. A 5 mm stone is noted at the interpole region of the right kidney.  Left Kidney:  Length: 10.5 cm. Echogenicity within normal limits. No mass or hydronephrosis  visualized.  Abdominal aorta:  No aneurysm visualized.  Other findings:  None.  IMPRESSION: 1. No acute abnormality seen in the abdomen. 2. 5 mm polyp noted near the base of the gallbladder; gallbladder otherwise unremarkable in appearance. 3. 5 mm nonobstructing stone noted at the interpole region of the right kidney.   Electronically Signed   By: Roanna RaiderJeffery  Chang M.D.   On: 03/25/2014 01:17   Stephanie Abdomen Limited  03/27/2014   CLINICAL DATA:  PAIN.  EXAM: Stephanie ABDOMEN LIMITED - RIGHT UPPER QUADRANT  COMPARISON:  DG ABD ACUTE W/CHEST dated 03/27/2014  FINDINGS: Gallbladder:  A 5 mm polyp is noted in the gallbladder. Gallbladder wall thickness normal. Negative Murphy sign. No gallstones. No pericholecystic fluid collection.  Common bile duct:  Diameter: 2.2 mm  Liver:  No focal lesion identified. Within normal limits in parenchymal echogenicity. Limited views of the pancreas reveal  no focal abnormality.  IMPRESSION: 5 mm polyp in the gallbladder, exam otherwise negative.   Electronically Signed   By: Maisie Fus  Register   On: 03/27/2014 11:41   Dg Abd Acute W/chest  03/27/2014   CLINICAL DATA:  Chest and abdominal pain; nausea and vomiting  EXAM: ACUTE ABDOMEN SERIES (ABDOMEN 2 VIEW & CHEST 1 VIEW)  COMPARISON:  Abdomen radiograph April 12, 2012  FINDINGS: PA chest: Lungs are clear. Heart size and pulmonary vascularity are normal. No adenopathy. There is upper lumbar levoscoliosis.  Supine and upright abdomen: There is moderate stool in the colon. Bowel gas pattern is unremarkable. No obstruction or free air. There is a 5 mm calculus with a nearby 2 mm calculus in the lower pole the left kidney. There are phleboliths in the pelvis. Liver appears prominent. There is lumbar levoscoliosis.  IMPRESSION: Bowel gas pattern unremarkable. Calculi in lower pole left kidney. Lungs clear.   Electronically Signed   By: Bretta Bang M.D.   On: 03/27/2014 08:41    Medications:    . methylPREDNISolone (SOLU-MEDROL) injection   50 mg Intravenous Daily  . nicotine  14 mg Transdermal Daily  . pantoprazole  20 mg Oral Daily  . potassium chloride SA  40 mEq Oral Once   Continuous Infusions:   Time spent: 35 minutes. The patient requires high complexity decision making and coordination of care with gastroenterology.    LOS: 4 days   Maryruth Bun Rama  [facility] Pager [phone]. If unable to reach me by pager, please call my cell phone at [phone].  *Please refer to [email or web address], password [email or web address] to get updated schedule on who will round on this patient, as hospitalists switch teams weekly. If 7PM-7AM, please contact night-coverage at [email or web address], password [email or web address] for any overnight needs.  03/31/2014, 7:37 AM    **Disclaimer: This note was dictated with voice recognition software. Similar sounding words can inadvertently be transcribed and this note may contain transcription errors which may not have been corrected upon publication of note.**      In an effort to keep you and your family informed about your hospital stay, I am providing you with this information sheet. If you or your family have any questions, please do not hesitate to have the nursing staff page me to set up a meeting time.  ADYLENE DLUGOSZ 03/31/2014 4 (Number of days in the hospital)  Treatment team:  Dr. Hillery Aldo, Hospitalist (Internist) Dr. Melvia Heaps (Gastroenterologist)  Active Treatment Issues with Plan:  Principal Problem:  Acute inflammation of the liver  This can be caused by infections, autoimmune problems or the toxic effects of alcohol/drugs or combination of these things. Your liver function tests are improved today.  So far, tests looking for the possibility of infection are negative.  Some tests, that look at the possibility of autoimmune disease are still pending. The screening tests for autoimmune problems have been negative. You're scheduled to have a biopsy of your liver today, which will help determine the  cause of your liver inflammation. Active Problems:  Smoking  We strongly advise you to discontinue smoking. A Nicotine patch has been ordered.  Reflux  You are on a medicine called Protonix to help inhibit acid secretion in your stomach.  Heavy alcohol use  We strongly urge you to avoid alcohol in the future. We will have the social worker provide you with community resources for further assistance.   Anticipated discharge date: Depends on recovery of your liver function.

## 2014-04-01 ENCOUNTER — Encounter (HOSPITAL_COMMUNITY): Payer: Self-pay | Admitting: Physician Assistant

## 2014-04-01 DIAGNOSIS — K5900 Constipation, unspecified: Secondary | ICD-10-CM | POA: Diagnosis present

## 2014-04-01 LAB — COMPREHENSIVE METABOLIC PANEL
ALT: 3728 U/L — ABNORMAL HIGH (ref 0–35)
ALT: 3255 U/L — ABNORMAL HIGH (ref 0–35)
AST: 471 U/L — AB (ref 0–37)
AST: 334 U/L — ABNORMAL HIGH (ref 0–37)
Albumin: 3.8 g/dL (ref 3.5–5.2)
Albumin: 4.2 g/dL (ref 3.5–5.2)
Alkaline Phosphatase: 72 U/L (ref 39–117)
Alkaline Phosphatase: 80 U/L (ref 39–117)
BUN: 12 mg/dL (ref 6–23)
BUN: 13 mg/dL (ref 6–23)
CALCIUM: 9.2 mg/dL (ref 8.4–10.5)
CO2: 29 mEq/L (ref 19–32)
CO2: 26 meq/L (ref 19–32)
Calcium: 9.9 mg/dL (ref 8.4–10.5)
Chloride: 97 mEq/L (ref 96–112)
Chloride: 95 mEq/L — ABNORMAL LOW (ref 96–112)
Creatinine, Ser: 0.58 mg/dL (ref 0.50–1.10)
Creatinine, Ser: 0.61 mg/dL (ref 0.50–1.10)
GFR calc non Af Amer: >90 mL/min (ref >90)
GFR calc non Af Amer: >90 mL/min (ref >90)
GLUCOSE: 190 mg/dL — AB (ref 70–99)
Glucose, Bld: 150 mg/dL — ABNORMAL HIGH (ref 70–99)
POTASSIUM: 3.9 meq/L (ref 3.7–5.3)
Potassium: 4.1 mEq/L (ref 3.7–5.3)
Sodium: 136 mEq/L — ABNORMAL LOW (ref 137–147)
Sodium: 135 mEq/L — ABNORMAL LOW (ref 137–147)
TOTAL PROTEIN: 6 g/dL (ref 6.0–8.3)
TOTAL PROTEIN: 6.8 g/dL (ref 6.0–8.3)
Total Bilirubin: 1 mg/dL (ref 0.3–1.2)
Total Bilirubin: 0.9 mg/dL (ref 0.3–1.2)

## 2014-04-01 LAB — PROTIME-INR
INR: 1.19 (ref 0.00–1.49)
PROTHROMBIN TIME: 14.8 s (ref 11.6–15.2)

## 2014-04-01 MED ORDER — OXYCODONE HCL 5 MG PO TABS: 5.0000 mg | ORAL_TABLET | ORAL | Status: DC | PRN

## 2014-04-01 MED ORDER — ONDANSETRON 4 MG PO TBDP: 4.0000 mg | ORAL_TABLET | ORAL | Status: DC | PRN

## 2014-04-01 MED ORDER — PREDNISONE 20 MG PO TABS: 40.0000 mg | ORAL_TABLET | Freq: Every day | ORAL | Status: DC

## 2014-04-01 MED ORDER — POLYETHYLENE GLYCOL 3350 17 G PO PACK
17.0000 g | PACK | Freq: Once | ORAL | Status: DC
  Filled 2014-04-01: qty 1

## 2014-04-01 NOTE — Progress Notes (Signed)
          Daily Rounding Note  04/01/2014, 10:12 AM  LOS: 5 days   SUBJECTIVE:       No stools, no vomiting, some nausea but better.  Pain and tenderness in RUQ  OBJECTIVE:         Vital signs in last 24 hours:    Temp:  [97.7 F (36.5 C)-98.5 F (36.9 C)] 97.7 F (36.5 C) (04/11 0612) Pulse Rate:  [64-78] 64 (04/11 0612) Resp:  [10-18] 15 (04/10 2122) BP: (106-125)/(67-84) 106/67 mmHg (04/11 0612) SpO2:  [98 %-99 %] 99 % (04/11 0612) Last BM Date: 03/27/14 General: thin, pale, looks unwel   Heart: rrr Chest: clear bil Abdomen: soft, ND, active BS.  Tender without guard or rebound in RUQ, no old or fresh blood and no bruising at perc biopsy site  Extremities: no CCE Neuro/Psych:  Alert, no asterixis, oriented x 3.   Intake/Output from previous day: 04/10 0701 - 04/11 0700 In: 600 [P.O.:600] Out: -   Intake/Output this shift:    Lab Results:  Recent Labs  03/31/14 0416  WBC 9.5  HGB 12.4  HCT 34.7*  PLT 224   BMET  Recent Labs  03/31/14 0332 03/31/14 1540 04/01/14 0245  NA 136* 137 136*  K 3.9 4.0 4.1  CL 98 97 97  CO2 28 28 29   GLUCOSE 173* 180* 150*  BUN 11 12 12   CREATININE 0.60 0.57 0.58  CALCIUM 9.1 9.5 9.2   LFT  Recent Labs  03/31/14 0332 03/31/14 1540 04/01/14 0245  PROT 5.9* 6.5 6.0  ALBUMIN 3.6 3.8 3.8  AST 1475* 801* 471*  ALT 5399* 4591* 3728*  ALKPHOS 74 77 72  BILITOT 1.5* 1.4* 1.0   PT/INR  Recent Labs  03/31/14 1750 04/01/14 0248  LABPROT 15.2 14.8  INR 1.23 1.19    Studies/Results: [place] Biopsy 03/31/2014     FINDINGS: Solid hepatic tissue was obtained. There were no immediate bleeding complications.  IMPRESSION: Ultrasound-guided core biopsy performed within the right lobe of the liver.   Electronically Signed   By: Irish LackGlenn  Yamagata M.D.   On: 03/31/2014 15:34    Scheduled Meds: . methylPREDNISolone (SOLU-MEDROL) injection  50 mg Intravenous Daily  . nicotine  14  mg Transdermal Daily  . pantoprazole  20 mg Oral Daily  . potassium chloride SA  40 mEq Oral Once   Continuous Infusions:  PRN Meds:.HYDROmorphone (DILAUDID) injection, ondansetron (ZOFRAN) IV, ondansetron, temazepam   ASSESMENT:   *  Acute hepatitis of undetermined cause, chronic ETOH abuse and drug toxicity suspected.  Iron studies normal, ferritin elevated 2938, transferrin normal, IgG normal, F actin IgG normal, ANCA tests pending, ANA negative, CMV not detected, mitochondrial Ab pending, APAP levels normal, EBV and viral hepatitis negative.  On empiric Solumedrol day 2.  * coagulopathy. Resolved.  *  Liver biopsy 4/10.  Path pending.  *  Hxepression. *  SS anemia.     PLAN   *  Await liver biopsy.     Lindalou HoseSarah J Gribbin  04/01/2014, 10:12 AM Pager: [phone]  GI Attending Note  I have personally taken an interval history, reviewed the chart, and examined the patient.  Likely she is improving as are her LFTs.  Until we obtain the liver biopsy results would continue steroids.  She can be discharged on prednisone 40 mg daily.  She'll need an outpatient followup in approximately 10 days.  Barbette Hairobert D. Arlyce DiceKaplan, MD, [facility] [phone]

## 2014-04-01 NOTE — Progress Notes (Signed)
Progress Note   Stephanie CoolChelsea R Brimage ZOX:[id number] DOB: [birth year] DOA: 03/27/2014 PCP: Roxy MannsMarne Tower, MD   Brief Narrative:    [name] is an 28 y.o. female with a PMH of chronic ethanol use, endometriosis, history of ureteral stones, GERD, prior suicide attempt who initially presented to the ER on 03/24/14 with complaints of upper abdominal pain and nonbilious vomiting. LFTs were minimally elevated on that visit.  She subsequently was discharged home and instructed to followup with gastroenterology. She returned to the ER on 03/27/14 with persistent symptoms, and was noted to have marked elevation of her LFTs.   Assessment/Plan:    Principal Problem:   Acute hepatitis The patient has rising LFTs in the setting of recent nausea, vomiting, abdominal pain, and alcohol abuse. Currently, the etiology is not definitively known. No elevation of acetaminophen level. No elevation of salicylates. EBV and viral hepatitis panel negative. NAC protocol discontinued in light of normal acetaminophen levels.  The patient's mother has a history of autoimmune hepatitis, but the patient's ANA is negative. CMV PCR negative.  Anti-smooth muscle antibodies negative.  Ferritin 2938 but transferrin normal at 215, iron normal at 72, and TIBC normal at 281.  Doubt hemochromatosis, elevated ferritin likely an acute phase reactant.  Ceruloplasmin, IgG (WNL), anti-smooth muscle antibodies (negative), myeloperoxidase antibodies (negative), alpha-1 anti-trypsin, ANCA screen (negative), ANCA antibodies, and mitochondrial antibodies ordered. LFTs significantly elevated with AST/ALT reaching maximal values 03/29/14, continuing to trend down. Bilirubin also trending down. INR normalizing. Continue empiric steroids for now. Status post liver biopsy 03/31/14. Active Problems:   Constipation We'll give a dose of MiraLAX today.   ANXIETY Patient normally takes Xanax 1 mg twice a day.   TOBACCO ABUSE Counsel. Nicotine patch  ordered.   GERD Continue Protonix 20 mg daily.   ALCOHOL ABUSE, HX OF Counseled regarding avoidance of alcohol in the future. Doubt she would be a candidate for liver transplantation given her history of alcohol abuse, although she appears to be more of a binge drinker. Social worker consulted for substance abuse counseling.   DVT Prophylaxis Continue SCDs.  Code Status: Full. Family Communication: [name], mother, updated at bedside. Disposition Plan: Home when stable.   IV access:  Peripheral IV  Procedures  None.  Medical Consultants:  Dr. Melvia Heapsobert Kaplan, Gastroenterology  Interventional radiology  Other Consultants:  None.  Anti-infectives:  None.  Subjective:    [name] reported worsening right upper quadrant pain after her biopsy last night and is still sore this morning. Some nausea but no further vomiting. Bowels have not moved since admission.  Objective:    Filed Vitals:   03/31/14 1112 03/31/14 1343 03/31/14 2122 04/01/14 0612  BP: 115/72 116/81 111/73 106/67  Pulse: 65 72 78 64  Temp:  98.2 F (36.8 C) 98.5 F (36.9 C) 97.7 F (36.5 C)  TempSrc:   Oral Oral  Resp: 13 14 15    Height:      Weight:      SpO2: 98% 99% 98% 99%    Intake/Output Summary (Last 24 hours) at 04/01/14 0736 Last data filed at 04/01/14 [id number]  Gross per 24 hour  Intake    600 ml  Output      0 ml  Net    600 ml    Exam: Gen:  NAD Cardiovascular:  RRR, No M/R/G Respiratory:  Lungs CTAB Gastrointestinal:  Abdomen soft, tender right upper quadrant, + BS Extremities:  No C/E/C   Data Reviewed:  Labs: Basic Metabolic Panel:  Recent Labs Lab 03/30/14 0332 03/30/14 1603 03/31/14 0332 03/31/14 1540 04/01/14 0245  NA 138 134* 136* 137 136*  K 3.9 3.7 3.9 4.0 4.1  CL 97 92* 98 97 97  CO2 27 23 28 28 29   GLUCOSE 89 140* 173* 180* 150*  BUN 9 10 11 12 12   CREATININE 0.64 0.56 0.60 0.57 0.58  CALCIUM 9.1 9.3 9.1 9.5 9.2   GFR Estimated  Creatinine Clearance: 75.7 ml/min (by C-G formula based on Cr of 0.58). Liver Function Tests:  Recent Labs Lab 03/30/14 0332 03/30/14 1603 03/31/14 0332 03/31/14 1540 04/01/14 0245  AST 7776* 4043* 1475* 801* 471*  ALT 9696* 6981* 5399* 4591* 3728*  ALKPHOS 80 87 74 77 72  BILITOT 1.6* 1.9* 1.5* 1.4* 1.0  PROT 6.3 7.2 5.9* 6.5 6.0  ALBUMIN 3.9 4.4 3.6 3.8 3.8    Recent Labs Lab 03/27/14 0840  LIPASE 24   Coagulation profile  Recent Labs Lab 03/29/14 0335 03/30/14 1118 03/31/14 0416 03/31/14 1750 04/01/14 0248  INR 1.75* 1.64* 1.33 1.23 1.19    CBC:  Recent Labs Lab 03/27/14 0840 03/28/14 0310 03/31/14 0416  WBC 9.3 10.5 9.5  NEUTROABS 7.2  --   --   HGB 14.1 12.2 12.4  HCT 39.7 34.4* 34.7*  MCV 93.0 93.2 91.6  PLT 180 222 224   Cardiac Enzymes:  Recent Labs Lab 03/27/14 1001  CKTOTAL 86   Sepsis Labs:  Recent Labs Lab 03/27/14 0840 03/27/14 1239 03/28/14 0310 03/31/14 0416  WBC 9.3  --  10.5 9.5  LATICACIDVEN  --  0.73  --   --    Microbiology No results found for this or any previous visit (from the past 240 hour(s)).   Radiographs/Studies:    US Abdomen Complete  04-03-14   CLINICAL DATA:  Abdominal pain, nausea and vomiting.  EXAM: ULTRASOUND ABDOMEN COMPLETE  COMPARISON:  CT of the abdomen and pelvis performed 04/15/2012, and renal ultrasound performed 12/04/2006  FINDINGS: Gallbladder:  No gallstones or wall thickening visualized. A small 5 mm polyp is incidentally noted near the base of the gallbladder. No sonographic Murphy sign noted.  Common bile duct:  Diameter: 0.2 cm, within normal limits in caliber.  Liver:  No focal lesion identified. Within normal limits in parenchymal echogenicity.  IVC:  No abnormality visualized.  Pancreas:  Visualized portion unremarkable.  Spleen:  Size and appearance within normal limits.  Right Kidney:  Length: 10.5 cm. Echogenicity within normal limits. No mass or hydronephrosis visualized. A 5 mm  stone is noted at the interpole region of the right kidney.  Left Kidney:  Length: 10.5 cm. Echogenicity within normal limits. No mass or hydronephrosis visualized.  Abdominal aorta:  No aneurysm visualized.  Other findings:  None.  IMPRESSION: 1. No acute abnormality seen in the abdomen. 2. 5 mm polyp noted near the base of the gallbladder; gallbladder otherwise unremarkable in appearance. 3. 5 mm nonobstructing stone noted at the interpole region of the right kidney.   Electronically Signed   By: Roanna Raider M.D.   On: 03-Apr-2014 01:17   US Abdomen Limited  03/27/2014   CLINICAL DATA:  PAIN.  EXAM: US ABDOMEN LIMITED - RIGHT UPPER QUADRANT  COMPARISON:  DG ABD ACUTE W/CHEST dated 03/27/2014  FINDINGS: Gallbladder:  A 5 mm polyp is noted in the gallbladder. Gallbladder wall thickness normal. Negative Murphy sign. No gallstones. No pericholecystic fluid collection.  Common bile duct:  Diameter: 2.2 mm  Liver:  No focal lesion identified. Within normal limits in parenchymal echogenicity. Limited views of the pancreas reveal no focal abnormality.  IMPRESSION: 5 mm polyp in the gallbladder, exam otherwise negative.   Electronically Signed   By: Maisie Fus  Register   On: 03/27/2014 11:41   Dg Abd Acute W/chest  03/27/2014   CLINICAL DATA:  Chest and abdominal pain; nausea and vomiting  EXAM: ACUTE ABDOMEN SERIES (ABDOMEN 2 VIEW & CHEST 1 VIEW)  COMPARISON:  Abdomen radiograph April 12, 2012  FINDINGS: PA chest: Lungs are clear. Heart size and pulmonary vascularity are normal. No adenopathy. There is upper lumbar levoscoliosis.  Supine and upright abdomen: There is moderate stool in the colon. Bowel gas pattern is unremarkable. No obstruction or free air. There is a 5 mm calculus with a nearby 2 mm calculus in the lower pole the left kidney. There are phleboliths in the pelvis. Liver appears prominent. There is lumbar levoscoliosis.  IMPRESSION: Bowel gas pattern unremarkable. Calculi in lower pole left kidney. Lungs  clear.   Electronically Signed   By: Bretta Bang M.D.   On: 03/27/2014 08:41    Medications:    . methylPREDNISolone (SOLU-MEDROL) injection  50 mg Intravenous Daily  . nicotine  14 mg Transdermal Daily  . pantoprazole  20 mg Oral Daily  . potassium chloride SA  40 mEq Oral Once   Continuous Infusions:   Time spent: 25 minutes.    LOS: 5 days   Maryruth Bun Maxten Shuler  [facility] Pager [phone]. If unable to reach me by pager, please call my cell phone at [phone].  *Please refer to [email or web address], password [email or web address] to get updated schedule on who will round on this patient, as hospitalists switch teams weekly. If 7PM-7AM, please contact night-coverage at [email or web address], password [email or web address] for any overnight needs.  04/01/2014, 7:36 AM    **Disclaimer: This note was dictated with voice recognition software. Similar sounding words can inadvertently be transcribed and this note may contain transcription errors which may not have been corrected upon publication of note.**

## 2014-04-01 NOTE — Progress Notes (Signed)
Attempted to meet with Pt.  Pt meeting with medical staff, including MD.    CSW to attempt to meet with Pt at another time.  Providence CrosbyAmanda Dilana Mcphie, LCSWA Clinical Social Work [phone]

## 2014-04-01 NOTE — Discharge Summary (Signed)
Physician Discharge Summary  [name] ZOX:[id number] DOB: May 15, 1986 DOA: 03/27/2014  PCP: Roxy MannsMarne Tower, MD  Admit date: 03/27/2014 Discharge date: 04/01/2014   Recommendations for Outpatient Follow-Up:   1. F/U liver biopsy results and outstanding serologies including mitochondrial antibodies, alpha 1 antitrypsin and ceruloplasmin.   Discharge Diagnosis:   Principal Problem:    Acute hepatitis Active Problems:    ANXIETY    TOBACCO ABUSE    GERD    ALCOHOL ABUSE, HX OF    Alcohol dependence, binge pattern    Unspecified constipation   Discharge Condition: Improved.  Diet recommendation: Regular.   History of Present Illness:   Stephanie CoolChelsea R Mccann is an 28 y.o. female with a PMH of chronic ethanol use, endometriosis, history of ureteral stones, GERD, prior suicide attempt who initially presented to the ER on 03/24/14 with complaints of upper abdominal pain and nonbilious vomiting. LFTs were minimally elevated on that visit. She subsequently was discharged home and instructed to followup with gastroenterology. She returned to the ER on 03/27/14 with persistent symptoms, and was noted to have marked elevation of her LFTs.   Hospital Course by Problem:   Principal Problem:  Acute hepatitis  The patient has rising LFTs in the setting of recent nausea, vomiting, abdominal pain, and alcohol abuse. Currently, the etiology is not definitively known. No elevation of acetaminophen level. No elevation of salicylates. EBV and viral hepatitis panel negative. NAC protocol discontinued in light of normal acetaminophen levels. The patient's mother has a history of autoimmune hepatitis, but the patient's ANA is negative. CMV PCR negative. Anti-smooth muscle antibodies negative. Ferritin 2938 but transferrin normal at 215, iron normal at 72, and TIBC normal at 281. Doubt hemochromatosis, elevated ferritin likely an acute phase reactant. Ceruloplasmin, IgG (WNL), anti-smooth muscle  antibodies (negative), myeloperoxidase antibodies (negative), alpha-1 anti-trypsin, ANCA screen (negative), ANCA antibodies, and mitochondrial antibodies ordered. LFTs significantly elevated with AST/ALT reaching maximal values 03/29/14, continuing to trend down. Bilirubin also trending down. INR normalizing. Continue empiric steroids for now. Status post liver biopsy 03/31/14. The patient will followup with Dr. Oscar Laaplan in 10 days. Active Problems:  Constipation  Given a dose of MiraLAX prior to discharge.  ANXIETY  Patient normally takes Xanax 1 mg twice a day. She can resume this. TOBACCO ABUSE  Counseled by nursing staff and provided with a nicotine patch while in the hospital.  GERD  Continue Protonix 20 mg daily.  ALCOHOL ABUSE, HX OF  Counseled regarding avoidance of alcohol in the future.  Social worker consulted for substance abuse counseling.    Procedures:    Liver biopsy 03/31/14   Medical Consultants:    Dr. Melvia Heapsobert Kaplan, Gastroenterology   Dr. Irish LackGlenn Yamagata, Interventional radiology    Discharge Exam:   Filed Vitals:   04/01/14 1405  BP: 125/80  Pulse: 78  Temp: 98.5 F (36.9 C)  Resp: 18   Filed Vitals:   03/31/14 1343 03/31/14 2122 04/01/14 0612 04/01/14 1405  BP: 116/81 111/73 106/67 125/80  Pulse: 72 78 64 78  Temp: 98.2 F (36.8 C) 98.5 F (36.9 C) 97.7 F (36.5 C) 98.5 F (36.9 C)  TempSrc:  Oral Oral Oral  Resp: 14 15  18   Height:      Weight:      SpO2: 99% 98% 99% 100%    Gen:  NAD Cardiovascular:  RRR, No M/R/G Respiratory: Lungs CTAB Gastrointestinal: Abdomen soft, RUQ tender, +BS. Extremities: No C/E/C    Discharge Instructions:  Discharge Orders   Future Orders Complete By Expires   Call MD for:  persistant nausea and vomiting  As directed    Call MD for:  severe uncontrolled pain  As directed    Call MD for:  temperature >100.4  As directed    Call MD for:  As directed    Scheduling Instructions:   Yellow skin     Diet general  As directed    Discharge instructions  As directed    Increase activity slowly  As directed    Lifting restrictions  As directed    Other Restrictions  As directed        Medication List    STOP taking these medications       acetaminophen 500 MG tablet  Commonly known as:  TYLENOL      TAKE these medications       ALPRAZolam 1 MG tablet  Commonly known as:  XANAX  Take 1 mg by mouth 2 (two) times daily as needed for anxiety.     lansoprazole 30 MG capsule  Commonly known as:  PREVACID  Take 1 capsule (30 mg total) by mouth daily at 12 noon.     ondansetron 4 MG disintegrating tablet  Commonly known as:  ZOFRAN-ODT  Take 1 tablet (4 mg total) by mouth every 4 (four) hours as needed for nausea or vomiting.     oxyCODONE 5 MG immediate release tablet  Commonly known as:  ROXICODONE  Take 1 tablet (5 mg total) by mouth every 4 (four) hours as needed for severe pain.     predniSONE 20 MG tablet  Commonly known as:  DELTASONE  Take 2 tablets (40 mg total) by mouth daily with breakfast.       Follow-up Information   Schedule an appointment as soon as possible for a visit with Melvia Heaps, MD. (Biopsy results.)    Specialty:  Gastroenterology   Contact information:   [address] [place] [address] [phone]       Schedule an appointment as soon as possible for a visit with Roxy Manns, MD. (If symptoms worsen)    Specialties:  Family Medicine, Radiology   Contact information:   [address] [place] [address] [place] [address] [phone]        The results of significant diagnostics from this hospitalization (including imaging, microbiology, ancillary and laboratory) are listed below for reference.     Significant Diagnostic Studies:   Radiographs: US Abdomen Complete  03/25/2014   CLINICAL DATA:  Abdominal pain, nausea and vomiting.  EXAM: ULTRASOUND ABDOMEN COMPLETE  COMPARISON:  CT of the abdomen and  pelvis performed 04/15/2012, and renal ultrasound performed 12/04/2006  FINDINGS: Gallbladder:  No gallstones or wall thickening visualized. A small 5 mm polyp is incidentally noted near the base of the gallbladder. No sonographic Murphy sign noted.  Common bile duct:  Diameter: 0.2 cm, within normal limits in caliber.  Liver:  No focal lesion identified. Within normal limits in parenchymal echogenicity.  IVC:  No abnormality visualized.  Pancreas:  Visualized portion unremarkable.  Spleen:  Size and appearance within normal limits.  Right Kidney:  Length: 10.5 cm. Echogenicity within normal limits. No mass or hydronephrosis visualized. A 5 mm stone is noted at the interpole region of the right kidney.  Left Kidney:  Length: 10.5 cm. Echogenicity within normal limits. No mass or hydronephrosis visualized.  Abdominal aorta:  No aneurysm visualized.  Other findings:  None.  IMPRESSION: 1. No acute abnormality seen in the abdomen. 2. 5 mm polyp noted near the base of the gallbladder; gallbladder otherwise unremarkable in appearance. 3. 5 mm nonobstructing stone noted at the interpole region of the right kidney.   Electronically Signed   By: Roanna Raider M.D.   On: 03/25/2014 01:17   US Abdomen Limited  03/27/2014   CLINICAL DATA:  PAIN.  EXAM: US ABDOMEN LIMITED - RIGHT UPPER QUADRANT  COMPARISON:  DG ABD ACUTE W/CHEST dated 03/27/2014  FINDINGS: Gallbladder:  A 5 mm polyp is noted in the gallbladder. Gallbladder wall thickness normal. Negative Murphy sign. No gallstones. No pericholecystic fluid collection.  Common bile duct:  Diameter: 2.2 mm  Liver:  No focal lesion identified. Within normal limits in parenchymal echogenicity. Limited views of the pancreas reveal no focal abnormality.  IMPRESSION: 5 mm polyp in the gallbladder, exam otherwise negative.   Electronically Signed   By: Maisie Fus  Register   On: 03/27/2014 11:41   US Biopsy  03/31/2014   CLINICAL DATA:  Acute hepatitis of unknown etiology with  significant liver dysfunction. The patient requires a random hepatic biopsy.  EXAM: ULTRASOUND GUIDED CORE BIOPSY OF LIVER  MEDICATIONS: 3.0 mg IV Versed; 75 mcg IV Fentanyl  Total Moderate Sedation Time: 12 min  PROCEDURE: The procedure, risks, benefits, and alternatives were explained to the patient. Questions regarding the procedure were encouraged and answered. The patient understands and consents to the procedure.  The abdominal wall was prepped with Betadine in a sterile fashion, and a sterile drape was applied covering the operative field. A sterile gown and sterile gloves were used for the procedure. Local anesthesia was provided with 1% Lidocaine.  Ultrasound was performed of the liver. Under direct ultrasound guidance, a 17 gauge trocar needle was advanced into the right lobe. After confirming needle tip position, a total of 2 separate 18 gauge core biopsy samples were obtained. These were submitted in formalin. Gel-Foam pledgets were injected into the tract via the outer needle as the needle was retracted.  COMPLICATIONS: None.  FINDINGS: Solid hepatic tissue was obtained. There were no immediate bleeding complications.  IMPRESSION: Ultrasound-guided core biopsy performed within the right lobe of the liver.   Electronically Signed   By: Irish Lack M.D.   On: 03/31/2014 15:34   Dg Abd Acute W/chest  03/27/2014   CLINICAL DATA:  Chest and abdominal pain; nausea and vomiting  EXAM: ACUTE ABDOMEN SERIES (ABDOMEN 2 VIEW & CHEST 1 VIEW)  COMPARISON:  Abdomen radiograph April 12, 2012  FINDINGS: PA chest: Lungs are clear. Heart size and pulmonary vascularity are normal. No adenopathy. There is upper lumbar levoscoliosis.  Supine and upright abdomen: There is moderate stool in the colon. Bowel gas pattern is unremarkable. No obstruction or free air. There is a 5 mm calculus with a nearby 2 mm calculus in the lower pole the left kidney. There are phleboliths in the pelvis. Liver appears prominent. There is  lumbar levoscoliosis.  IMPRESSION: Bowel gas pattern unremarkable. Calculi in lower pole left kidney. Lungs clear.   Electronically Signed   By: Bretta Bang M.D.   On: 03/27/2014 08:41    Labs:  Basic Metabolic Panel:  Recent Labs Lab 03/30/14 1603 03/31/14 0332 03/31/14 1540 04/01/14 0245 04/01/14 1543  NA 134* 136* 137 136* 135*  K 3.7 3.9 4.0 4.1 3.9  CL 92* 98 97 97 95*  CO2 23 28 28 29 26   GLUCOSE 140* 173* 180* 150* 190*  BUN 10 11 12 12 13   CREATININE 0.56 0.60 0.57 0.58 0.61  CALCIUM 9.3 9.1 9.5 9.2 9.9   GFR Estimated Creatinine Clearance: 75.7 ml/min (by C-G formula based on Cr of 0.61). Liver Function Tests:  Recent Labs Lab 03/30/14 1603 03/31/14 0332 03/31/14 1540 04/01/14 0245 04/01/14 1543  AST 4043* 1475* 801* 471* 334*  ALT 6981* 5399* 4591* 3728* 3255*  ALKPHOS 87 74 77 72 80  BILITOT 1.9* 1.5* 1.4* 1.0 0.9  PROT 7.2 5.9* 6.5 6.0 6.8  ALBUMIN 4.4 3.6 3.8 3.8 4.2    Recent Labs Lab 03/27/14 0840  LIPASE 24   Coagulation profile  Recent Labs Lab 03/29/14 0335 03/30/14 1118 03/31/14 0416 03/31/14 1750 04/01/14 0248  INR 1.75* 1.64* 1.33 1.23 1.19    CBC:  Recent Labs Lab 03/27/14 0840 03/28/14 0310 03/31/14 0416  WBC 9.3 10.5 9.5  NEUTROABS 7.2  --   --   HGB 14.1 12.2 12.4  HCT 39.7 34.4* 34.7*  MCV 93.0 93.2 91.6  PLT 180 222 224   Cardiac Enzymes:  Recent Labs Lab 03/27/14 1001  CKTOTAL 86   Anemia work up  Recent Labs  03/30/14 1603 03/31/14 1750  FERRITIN 2938*  --   TIBC  --  281  IRON  --  72    Time coordinating discharge: 35 minutes.  SignedMaryruth Bun Shamiyah Ngu  Pager [phone] [facility] 04/01/2014, 4:59 PM

## 2014-04-03 ENCOUNTER — Ambulatory Visit: Payer: 59 | Admitting: Internal Medicine

## 2014-04-03 ENCOUNTER — Telehealth: Payer: Self-pay

## 2014-04-03 ENCOUNTER — Other Ambulatory Visit: Payer: Self-pay

## 2014-04-03 DIAGNOSIS — K759 Inflammatory liver disease, unspecified: Secondary | ICD-10-CM

## 2014-04-03 LAB — CERULOPLASMIN: Ceruloplasmin: 16 mg/dL — ABNORMAL LOW (ref 18–53)

## 2014-04-03 LAB — MITOCHONDRIAL ANTIBODIES: Mitochondrial M2 Ab, IgG: 0.28 (ref <0.91)

## 2014-04-03 NOTE — Telephone Encounter (Signed)
Left message for pt to call back.  [facility]@9am . Pt to be NPO after midnight and drink 1 bottle of contrast at 8am. Pt aware of appt. And will pick up contrast from [place] [facility].

## 2014-04-03 NOTE — Telephone Encounter (Signed)
Message copied by Chrystie NoseHUNT, LINDA R on Mon Apr 03, 2014  2:19 PM ------      Message from: Melvia HeapsKAPLAN, ROBERT D      Created: Mon Apr 03, 2014 10:03 AM       Please contact patient and schedule a CT of the abdomen.  Rule out hepatic vein thrombosis.  She was discharged over the weekend with acute hepatitis. ------

## 2014-04-04 ENCOUNTER — Encounter: Payer: Self-pay | Admitting: Internal Medicine

## 2014-04-04 ENCOUNTER — Telehealth: Payer: Self-pay | Admitting: Family Medicine

## 2014-04-04 ENCOUNTER — Ambulatory Visit (INDEPENDENT_AMBULATORY_CARE_PROVIDER_SITE_OTHER): Payer: 59 | Admitting: Internal Medicine

## 2014-04-04 VITALS — BP 104/68 | HR 67 | Temp 98.4°F | Wt 106.2 lb

## 2014-04-04 DIAGNOSIS — B370 Candidal stomatitis: Secondary | ICD-10-CM

## 2014-04-04 MED ORDER — NYSTATIN 100000 UNIT/ML MT SUSP: 5.0000 mL | Freq: Four times a day (QID) | OROMUCOSAL | Status: DC

## 2014-04-04 NOTE — Progress Notes (Signed)
Pre visit review using our clinic review tool, if applicable. No additional management support is needed unless otherwise documented below in the visit note. 

## 2014-04-04 NOTE — Progress Notes (Signed)
Subjective:    Patient ID: [name], female    DOB: 1986-03-10, 28 y.o.   MRN: [id number]  HPI  Pt presents to the clinic today with c/o white bumps on the back of her tongue and roof of her mouth. She noticed this 2 days ago. She also c/o sore throat but denies difficulty swallowing.  She recently was at the hospital being worked up for viral hepatitis. She is following GI for that.  Review of Systems      Past Medical History  Diagnosis Date  . Endometriosis   . Lumbar facet arthropathy   . UTI (lower urinary tract infection)   . History of suicide attempt WRIST CUTTINE    PT STATES LAST TIME AGE 62--  NO ATTEMPTS SINCE--  RECEIVED COUNSELING  . History of alcohol abuse AS TEEN  . OA (osteoarthritis of spine) LUMBAR --  PER PT  . History of major depression AGE 62-  SUICIDE ATTEMPT    PT STATES NO PROBLEMS SINCE  . CIN III (cervical intraepithelial neoplasia III)   . Acid reflux OCCASIONAL  . Frequency of urination     nocturia, hematuria, urgency   . History of kidney stones     ureteral stone right    Current Outpatient Prescriptions  Medication Sig Dispense Refill  . lansoprazole (PREVACID) 30 MG capsule Take 1 capsule (30 mg total) by mouth daily at 12 noon.  30 capsule  0  . ondansetron (ZOFRAN-ODT) 4 MG disintegrating tablet Take 1 tablet (4 mg total) by mouth every 4 (four) hours as needed for nausea or vomiting.  20 tablet  2  . oxyCODONE (ROXICODONE) 5 MG immediate release tablet Take 1 tablet (5 mg total) by mouth every 4 (four) hours as needed for severe pain.  20 tablet  0  . predniSONE (DELTASONE) 20 MG tablet Take 2 tablets (40 mg total) by mouth daily with breakfast.  30 tablet  0   No current facility-administered medications for this visit.    Allergies  Allergen Reactions  . Codeine Itching    Family History  Problem Relation Age of Onset  . Cancer Mother   . Heart failure Father   . Hepatitis      Autoimmune treated with prednisone     History   Social History  . Marital Status: Single    Spouse Name: N/A    Number of Children: N/A  . Years of Education: N/A   Occupational History  . Not on file.   Social History Main Topics  . Smoking status: Current Every Day Smoker -- 0.50 packs/day for 8 years    Types: Cigarettes  . Smokeless tobacco: Never Used  . Alcohol Use: Yes  . Drug Use: No     Comment: HISTORY MARIJIUANA USE YRS AGO--  PT STATES HAS NOT USED SINCE  . Sexual Activity: Yes    Birth Control/ Protection: None   Other Topics Concern  . Not on file   Social History Narrative  . No narrative on file     Constitutional: Denies fever, malaise, fatigue, headache or abrupt weight changes.  HEENT: Pt reports sore throat. Denies eye pain, eye redness, ear pain, ringing in the ears, wax buildup, runny nose, nasal congestion, bloody noset. Respiratory: Denies difficulty breathing, shortness of breath, cough or sputum production.    No other specific complaints in a complete review of systems (except as listed in HPI above).  Objective:   Physical Exam  BP 104/68  Pulse 67  Temp(Src) 98.4 F (36.9 C) (Oral)  Wt 106 lb 4 oz (48.195 kg)  SpO2 98%  LMP 03/12/2014 Wt Readings from Last 3 Encounters:  04/04/14 106 lb 4 oz (48.195 kg)  03/27/14 100 lb (45.36 kg)  04/16/12 96 lb (43.545 kg)    General: Appears her stated age, well developed, well nourished in NAD.  HEENT: Head: normal shape and size; Eyes: sclera white, no icterus, conjunctiva pink, PERRLA and EOMs intact; Ears: Tm's gray and intact, normal light reflex; Nose: mucosa pink and moist, septum midline; Throat/Mouth: Teeth present, mucosa pink and moist, white coating noted in back of throat.  Cardiovascular: Normal rate and rhythm. S1,S2 noted.  No murmur, rubs or gallops noted. No JVD or BLE edema. No carotid bruits noted. Pulmonary/Chest: Normal effort and positive vesicular breath sounds. No respiratory distress. No wheezes, rales  or ronchi noted.   BMET    Component Value Date/Time   NA 135* 04/01/2014 1543   K 3.9 04/01/2014 1543   CL 95* 04/01/2014 1543   CO2 26 04/01/2014 1543   GLUCOSE 190* 04/01/2014 1543   BUN 13 04/01/2014 1543   CREATININE 0.61 04/01/2014 1543   CALCIUM 9.9 04/01/2014 1543   GFRNONAA >90 04/01/2014 1543   GFRAA >90 04/01/2014 1543    Lipid Panel  No results found for this basename: chol, trig, hdl, cholhdl, vldl, ldlcalc    CBC    Component Value Date/Time   WBC 9.5 03/31/2014 0416   RBC 3.79* 03/31/2014 0416   HGB 12.4 03/31/2014 0416   HCT 34.7* 03/31/2014 0416   PLT 224 03/31/2014 0416   MCV 91.6 03/31/2014 0416   MCH 32.7 03/31/2014 0416   MCHC 35.7 03/31/2014 0416   RDW 12.4 03/31/2014 0416   LYMPHSABS 1.1 03/27/2014 0840   MONOABS 1.0 03/27/2014 0840   EOSABS 0.0 03/27/2014 0840   BASOSABS 0.0 03/27/2014 0840    Hgb A1C No results found for this basename: HGBA1C         Assessment & Plan:   Oral thrush:  Erx for nystatin swish and swallow  RTC as needed or if symptoms persist or worsen

## 2014-04-04 NOTE — Telephone Encounter (Signed)
Relevant patient education mailed to patient.  

## 2014-04-04 NOTE — Patient Instructions (Addendum)
Thrush, Adult  Thrush, also called oral candidiasis, is a fungal infection that develops in the mouth and throat and on the tongue. It causes white patches to form on the mouth and tongue. Thrush is most common in older adults, but it can occur at any age.  Many cases of thrush are mild, but this infection can also be more serious. Thrush can be a recurring problem for people who have chronic illnesses or who take medicines that limit the body's ability to fight infection. Because these people have difficulty fighting infections, the fungus that causes thrush can spread throughout the body. This can cause life-threatening blood or organ infections. CAUSES  Thrush is usually caused by a yeast called Candida albicans. This fungus is normally present in small amounts in the mouth and on other mucous membranes. It usually causes no harm. However, when conditions are present that allow the fungus to grow uncontrolled, it invades surrounding tissues and becomes an infection. Less often, other Candida species can also lead to thrush.  RISK FACTORS Thrush is more likely to develop in the following people:  People with an impaired ability to fight infection (weakened immune system).   Older adults.   People with HIV.   People with diabetes.   People with dry mouth (xerostomia).   Pregnant women.   People with poor dental care, especially those who have false teeth.   People who use antibiotic medicines.  SIGNS AND SYMPTOMS  Thrush can be a mild infection that causes no symptoms. If symptoms develop, they may include:   A burning feeling in the mouth and throat. This can occur at the start of a thrush infection.   White patches that adhere to the mouth and tongue. The tissue around the patches may be red, raw, and painful. If rubbed (during tooth brushing, for example), the patches and the tissue of the mouth may bleed easily.   A bad taste in the mouth or difficulty tasting foods.    Cottony feeling in the mouth.   Pain during eating and swallowing. DIAGNOSIS  Your health care provider can usually diagnose thrush by looking in your mouth and asking you questions about your health.  TREATMENT  Medicines that help prevent the growth of fungi (antifungals) are the standard treatment for thrush. These medicines are either applied directly to the affected area (topical) or swallowed (oral). The treatment will depend on the severity of the condition.  Mild Thrush Mild cases of thrush may clear up with the use of an antifungal mouth rinse or lozenges. Treatment usually lasts about 14 days.  Moderate to Severe Thrush  More severe thrush infections that have spread to the esophagus are treated with an oral antifungal medicine. A topical antifungal medicine may also be used.   For some severe infections, a treatment period longer than 14 days may be needed.   Oral antifungal medicines are almost never used during pregnancy because the fetus may be harmed. However, if a pregnant woman has a rare, severe thrush infection that has spread to her blood, oral antifungal medicines may be used. In this case, the risk of harm to the mother and fetus from the severe thrush infection may be greater than the risk posed by the use of antifungal medicines.  Persistent or Recurrent Thrush For cases of thrush that do not go away or keep coming back, treatment may involve the following:   Treatment may be needed twice as long as the symptoms last.   Treatment will   include both oral and topical antifungal medicines.   People with weakened immune systems can take an antifungal medicine on a continuous basis to prevent thrush infections.  It is important to treat conditions that make you more likely to get thrush, such as diabetes or HIV.  HOME CARE INSTRUCTIONS   Only take over-the-counter or prescription medicine as directed by your health care provider. Talk to your health care  provider about an over-the-counter medicine called gentian violet, which kills bacteria and fungi.   Eat plain, unflavored yogurt as directed by your health care provider. Check the label to make sure the yogurt contains live cultures. This yogurt can help healthy bacteria grow in the mouth that can stop the growth of the fungus that causes thrush.   Try these measures to help reduce the discomfort of thrush:   Drink cold liquids such as water or iced tea.   Try flavored ice treats or frozen juices.   Eat foods that are easy to swallow, such as gelatin, ice cream, or custard.   If the patches in your mouth are painful, try drinking from a straw.   Rinse your mouth several times a day with a warm saltwater rinse. You can make the saltwater mixture with 1 tsp (6 g) of salt in 8 fl oz (0.2 L) of warm water.   If you wear dentures, remove the dentures before going to bed, brush them vigorously, and soak them in a cleaning solution as directed by your health care provider.   Women who are breastfeeding should clean their nipples with an antifungal medicine as directed by their health care provider. Dry the nipples after breastfeeding. Applying lanolin-containing body lotion may help relieve nipple soreness.  SEEK MEDICAL CARE IF:  Your symptoms are getting worse or are not improving within 7 days of starting treatment.   You have symptoms of spreading infection, such as white patches on the skin outside of the mouth.   You are nursing and you have redness, burning, or pain in the nipples that is not relieved with treatment.  MAKE SURE YOU:  Understand these instructions.  Will watch your condition.  Will get help right away if you are not doing well or get worse. Document Released: 09/02/2004 Document Revised: 09/28/2013 Document Reviewed: 07/11/2013 [employer] Patient Information 2014 [employer].  

## 2014-04-05 ENCOUNTER — Telehealth: Payer: Self-pay | Admitting: Gastroenterology

## 2014-04-05 ENCOUNTER — Other Ambulatory Visit: Payer: Self-pay

## 2014-04-05 DIAGNOSIS — K759 Inflammatory liver disease, unspecified: Secondary | ICD-10-CM

## 2014-04-05 NOTE — Telephone Encounter (Signed)
Pt is calling for her liver biopsy results.

## 2014-04-05 NOTE — Telephone Encounter (Signed)
Pt aware and OV scheduled for pt. CT already scheduled.

## 2014-04-05 NOTE — Telephone Encounter (Signed)
Liver biopsy findings suggest either toxic exposure to the liver or a drug-induced hepatitis. Please schedule CT of the abdomen to rule out hepatic vein thrombosis. Instructed patient to drop prednisone to 30 mg daily and after 3 days to 20 mg daily, then after 3 days to 15 mg, then after 3 days to 10 mg daily. She needs repeat LFTs and office visit next week

## 2014-04-06 ENCOUNTER — Ambulatory Visit (INDEPENDENT_AMBULATORY_CARE_PROVIDER_SITE_OTHER)
Admission: RE | Admit: 2014-04-06 | Discharge: 2014-04-06 | Disposition: A | Discharge status: Home / Self Care | Payer: 59 | Source: Ambulatory Visit | Attending: Gastroenterology | Admitting: Gastroenterology

## 2014-04-06 DIAGNOSIS — K759 Inflammatory liver disease, unspecified: Secondary | ICD-10-CM

## 2014-04-06 MED ORDER — IOHEXOL 300 MG/ML  SOLN
80.0000 mL | Freq: Once | INTRAMUSCULAR | Status: AC | PRN
  Administered 2014-04-06: 80 mL via INTRAVENOUS

## 2014-04-06 NOTE — Progress Notes (Signed)
Quick Note:  Please inform the patient that CT scan was normal and to continue current plan of action ______ 

## 2014-04-07 LAB — ALPHA-1 ANTITRYPSIN PHENOTYPE: A1 ANTITRYPSIN: 198 mg/dL (ref 83–199)

## 2014-04-11 ENCOUNTER — Telehealth: Payer: Self-pay | Admitting: *Deleted

## 2014-04-11 NOTE — Telephone Encounter (Signed)
Good - I'm glad she is feeling better

## 2014-04-11 NOTE — Telephone Encounter (Signed)
Called pt to check and see how she is doing after being d/c from hospital. Pt said she is feeling better an has a f/u appt with GI on 04/14/14

## 2014-04-13 ENCOUNTER — Other Ambulatory Visit (INDEPENDENT_AMBULATORY_CARE_PROVIDER_SITE_OTHER): Payer: Self-pay

## 2014-04-13 DIAGNOSIS — K759 Inflammatory liver disease, unspecified: Secondary | ICD-10-CM

## 2014-04-13 LAB — HEPATIC FUNCTION PANEL
ALK PHOS: 69 U/L (ref 39–117)
ALT: 154 U/L — ABNORMAL HIGH (ref 0–35)
AST: 40 U/L — AB (ref 0–37)
Albumin: 3.9 g/dL (ref 3.5–5.2)
BILIRUBIN TOTAL: 0.9 mg/dL (ref 0.3–1.2)
Bilirubin, Direct: 0.1 mg/dL (ref 0.0–0.3)
Total Protein: 6.6 g/dL (ref 6.0–8.3)

## 2014-04-14 ENCOUNTER — Encounter: Payer: Self-pay | Admitting: Gastroenterology

## 2014-04-14 ENCOUNTER — Ambulatory Visit (INDEPENDENT_AMBULATORY_CARE_PROVIDER_SITE_OTHER): Payer: Self-pay | Admitting: Gastroenterology

## 2014-04-14 VITALS — BP 110/80 | HR 68 | Ht 61.0 in | Wt 104.0 lb

## 2014-04-14 DIAGNOSIS — B179 Acute viral hepatitis, unspecified: Secondary | ICD-10-CM

## 2014-04-14 DIAGNOSIS — K7200 Acute and subacute hepatic failure without coma: Secondary | ICD-10-CM

## 2014-04-14 NOTE — Progress Notes (Signed)
_                                                                                                                History of Present Illness:  The patient has returned following hospitalization for acute hepatitis.  Except for some mild residual right upper quadrant pain she is feeling well.  LFTs are almost normal.  She is on a tapering schedule of prednisone.  Biopsies were consistent with an acute hepatic injury from either a toxin or drug.  None has been identified.  CT scan was done to rule out hepatic vein thrombosis.  The patient vehemently denies any drug use and has no known exposure to toxins.  She does work as a Interior and spatial designerhairdresser.    Past Medical History  Diagnosis Date  . Endometriosis   . Lumbar facet arthropathy   . UTI (lower urinary tract infection)   . History of suicide attempt WRIST CUTTINE AGE 28--    PT STATES LAST TIME AGE 28--  NO ATTEMPTS SINCE--  RECEIVED COUNSELING  . History of alcohol abuse AS TEEN  . OA (osteoarthritis of spine) LUMBAR --  PER PT  . History of major depression AGE 28-  SUICIDE ATTEMPT    PT STATES NO PROBLEMS SINCE  . CIN III (cervical intraepithelial neoplasia III)   . Acid reflux OCCASIONAL  . Frequency of urination     nocturia, hematuria, urgency   . History of kidney stones     ureteral stone right   Past Surgical History  Procedure Laterality Date  . Cervical biopsy  w/ loop electrode excision  FEB  2013    CIN III  (GYN OFFICE)  . Wisdom tooth extraction  2012    ORAL SURGEON OFFICE  . Orif right little finger fx  2006  . Cystoscopy with ureteroscopy  04/20/2012    Procedure: CYSTOSCOPY WITH URETEROSCOPY;  Surgeon: Antony HasteMatthew Ramsey Eskridge, MD;  [facility];  Service: Urology;  Laterality: N/A;  CYSTO, RIGHT URETEROSCOPY ,  STONE BASKET EXTRACTION   CARM LASER     family history includes Cancer in her mother; Heart failure in her father; Hepatitis in an other family member. Current Outpatient  Prescriptions  Medication Sig Dispense Refill  . lansoprazole (PREVACID) 30 MG capsule Take 1 capsule (30 mg total) by mouth daily at 12 noon.  30 capsule  0  . ondansetron (ZOFRAN-ODT) 4 MG disintegrating tablet Take 1 tablet (4 mg total) by mouth every 4 (four) hours as needed for nausea or vomiting.  20 tablet  2  . predniSONE (DELTASONE) 20 MG tablet Take 2 tablets (40 mg total) by mouth daily with breakfast.  30 tablet  0   No current facility-administered medications for this visit.   Allergies as of 04/14/2014 - Review Complete 04/14/2014  Allergen Reaction Noted  . Codeine Itching 03/25/2014    reports that she has been smoking Cigarettes.  She has a 4 pack-year smoking history. She has never used smokeless tobacco. She reports that she  drinks alcohol. She reports that she does not use illicit drugs.     Review of Systems: Pertinent positive and negative review of systems were noted in the above HPI section. All other review of systems were otherwise negative.  Vital signs were reviewed in today's medical record Physical Exam: General: Well developed , well nourished, no acute distress Skin: anicteric Head: Normocephalic and atraumatic Eyes:  sclerae anicteric, EOMI Ears: Normal auditory acuity Mouth: No deformity or lesions Neck: Supple, no masses or thyromegaly Lungs: Clear throughout to auscultation Heart: Regular rate and rhythm; no murmurs, rubs or bruits Abdomen: Soft, and non distended. No masses, hepatosplenomegaly or hernias noted. Normal Bowel sounds.  There is minimal tenderness to palpation in the right upper quadrant Rectal:deferred Musculoskeletal: Symmetrical with no gross deformities  Skin: No lesions on visible extremities Pulses:  Normal pulses noted Extremities: No clubbing, cyanosis, edema or deformities noted Neurological: Alert oriented x 4, grossly nonfocal Cervical Nodes:  No significant cervical adenopathy Inguinal Nodes: No significant inguinal  adenopathy Psychological:  Alert and cooperative. Normal mood and affect  See Assessment and Plan under Problem List

## 2014-04-14 NOTE — Patient Instructions (Addendum)
Prednisone taper:  Take 20 mg  tablets every day for 5 days Then take 15mg  daily for 5 days Then take 10mg  daily for 5 days Then take 5mg  for 5 days Then discontinue  Follow up in 6 weeks Also you will need labs in 6 weeks (LFT's and INR)

## 2014-04-14 NOTE — Assessment & Plan Note (Signed)
Biopsies and clinical course are consistent with an acute hepatic injury from either a toxin or drug.  None has been identified.  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  Recommendations #1 rapid steroid taper over the next 10 days #2 patient was instructed not to use any alcohol or for the next 4-6 weeks

## 2014-04-20 ENCOUNTER — Telehealth: Payer: Self-pay | Admitting: Gastroenterology

## 2014-04-20 MED ORDER — PREDNISONE 20 MG PO TABS: 40.0000 mg | ORAL_TABLET | Freq: Every day | ORAL | Status: DC

## 2014-04-20 NOTE — Telephone Encounter (Signed)
Called pt to inform med sent  

## 2014-04-28 ENCOUNTER — Encounter: Payer: Self-pay | Admitting: Internal Medicine

## 2014-04-28 ENCOUNTER — Ambulatory Visit (INDEPENDENT_AMBULATORY_CARE_PROVIDER_SITE_OTHER): Payer: Self-pay | Admitting: Internal Medicine

## 2014-04-28 VITALS — BP 98/62 | HR 68 | Temp 97.9°F | Ht 61.5 in | Wt 108.2 lb

## 2014-04-28 DIAGNOSIS — B373 Candidiasis of vulva and vagina: Secondary | ICD-10-CM

## 2014-04-28 DIAGNOSIS — N898 Other specified noninflammatory disorders of vagina: Secondary | ICD-10-CM

## 2014-04-28 DIAGNOSIS — B3731 Acute candidiasis of vulva and vagina: Secondary | ICD-10-CM

## 2014-04-28 LAB — POCT URINALYSIS DIPSTICK
Bilirubin, UA: NEGATIVE
GLUCOSE UA: NEGATIVE
KETONES UA: NEGATIVE
NITRITE UA: NEGATIVE
PH UA: 6
Spec Grav, UA: >=1.03
Urobilinogen, UA: 0.2

## 2014-04-28 MED ORDER — FLUCONAZOLE 150 MG PO TABS: 150.0000 mg | ORAL_TABLET | Freq: Once | ORAL | Status: DC

## 2014-04-28 NOTE — Progress Notes (Signed)
Subjective:    Patient ID: [name], female    DOB: 1986-12-12, 28 y.o.   MRN: [id number]  HPI  Pt presents to the clinic today with c/o vaginal discharge. She reports this started 2 days ago. She describes the discharge as thick and white. She denies itching or urinary symptoms. Her LMP was 03/23/14, but she reports her periods are always irregular. She is sexually active, not using protection and not on any birth control. She denies pelvic pain, fever, chills or nausea.  Review of Systems      Past Medical History  Diagnosis Date  . Endometriosis   . Lumbar facet arthropathy   . UTI (lower urinary tract infection)   . History of suicide attempt WRIST CUTTINE    PT STATES LAST TIME AGE 6--  NO ATTEMPTS SINCE--  RECEIVED COUNSELING  . History of alcohol abuse AS TEEN  . OA (osteoarthritis of spine) LUMBAR --  PER PT  . History of major depression AGE 6-  SUICIDE ATTEMPT    PT STATES NO PROBLEMS SINCE  . CIN III (cervical intraepithelial neoplasia III)   . Acid reflux OCCASIONAL  . Frequency of urination     nocturia, hematuria, urgency   . History of kidney stones     ureteral stone right    Current Outpatient Prescriptions  Medication Sig Dispense Refill  . lansoprazole (PREVACID) 30 MG capsule Take 1 capsule (30 mg total) by mouth daily at 12 noon.  30 capsule  0  . predniSONE (DELTASONE) 20 MG tablet Take 2 tablets (40 mg total) by mouth daily with breakfast.  10 tablet  0   No current facility-administered medications for this visit.    Allergies  Allergen Reactions  . Codeine Itching    Family History  Problem Relation Age of Onset  . Cancer Mother   . Heart failure Father   . Hepatitis      Autoimmune treated with prednisone    History   Social History  . Marital Status: Single    Spouse Name: N/A    Number of Children: N/A  . Years of Education: N/A   Occupational History  . Hair Dresser    Social History Main Topics  . Smoking status:  Current Every Day Smoker -- 0.50 packs/day for 8 years    Types: Cigarettes  . Smokeless tobacco: Never Used  . Alcohol Use: Yes  . Drug Use: No     Comment: HISTORY MARIJIUANA USE YRS AGO--  PT STATES HAS NOT USED SINCE  . Sexual Activity: Yes    Birth Control/ Protection: None   Other Topics Concern  . Not on file   Social History Narrative  . No narrative on file     Constitutional: Denies fever, malaise, fatigue, headache or abrupt weight changes.  GU: Pt reports vaginal discharge. Denies urgency, frequency, pain with urination, burning sensation, blood in urine, odor or discharge.   No other specific complaints in a complete review of systems (except as listed in HPI above).  Objective:   Physical Exam   BP 98/62  Pulse 68  Temp(Src) 97.9 F (36.6 C) (Oral)  Ht 5' 1.5" (1.562 m)  Wt 108 lb 4 oz (49.102 kg)  BMI 20.13 kg/m2  SpO2 98%  LMP 03/25/2014 Wt Readings from Last 3 Encounters:  04/28/14 108 lb 4 oz (49.102 kg)  04/14/14 104 lb (47.174 kg)  04/04/14 106 lb 4 oz (48.195 kg)    General: Appears her stated  age, well developed, well nourished in NAD. Cardiovascular: Normal rate and rhythm. S1,S2 noted.  No murmur, rubs or gallops noted. No JVD or BLE edema. No carotid bruits noted. Pulmonary/Chest: Normal effort and positive vesicular breath sounds. No respiratory distress. No wheezes, rales or ronchi noted.  Abdomen: Soft and nontender. Normal bowel sounds, no bruits noted. No distention or masses noted. Liver, spleen and kidneys non palpable. Pelvic: Labial irritation noted. Small amount of white discharge noted at vaginal opening. No CMT.    BMET    Component Value Date/Time   NA 135* 04/01/2014 1543   K 3.9 04/01/2014 1543   CL 95* 04/01/2014 1543   CO2 26 04/01/2014 1543   GLUCOSE 190* 04/01/2014 1543   BUN 13 04/01/2014 1543   CREATININE 0.61 04/01/2014 1543   CALCIUM 9.9 04/01/2014 1543   GFRNONAA >90 04/01/2014 1543   GFRAA >90 04/01/2014 1543     Lipid Panel  No results found for this basename: chol, trig, hdl, cholhdl, vldl, ldlcalc    CBC    Component Value Date/Time   WBC 9.5 03/31/2014 0416   RBC 3.79* 03/31/2014 0416   HGB 12.4 03/31/2014 0416   HCT 34.7* 03/31/2014 0416   PLT 224 03/31/2014 0416   MCV 91.6 03/31/2014 0416   MCH 32.7 03/31/2014 0416   MCHC 35.7 03/31/2014 0416   RDW 12.4 03/31/2014 0416   LYMPHSABS 1.1 03/27/2014 0840   MONOABS 1.0 03/27/2014 0840   EOSABS 0.0 03/27/2014 0840   BASOSABS 0.0 03/27/2014 0840    Hgb A1C No results found for this basename: HGBA1C        Assessment & Plan:   Vaginal discharge secondary to vaginal candidiasis:  Wet prep: + yeast, no clue cells no trich Urine preg neg Urinalysis: small leuks, trace protein, mod blood eRx for diflucan 150 mg po x 1  RTC as needed or if symptoms persist or worsen

## 2014-04-28 NOTE — Patient Instructions (Addendum)
Candidal Vulvovaginitis  Candidal vulvovaginitis is an infection of the vagina and vulva. The vulva is the skin around the opening of the vagina. This may cause itching and discomfort in and around the vagina.   HOME CARE  · Only take medicine as told by your doctor.  · Do not have sex (intercourse) until the infection is healed or as told by your doctor.  · Practice safe sex.  · Tell your sex partner about your infection.  · Do not douche or use tampons.  · Wear cotton underwear. Do not wear tight pants or panty hose.  · Eat yogurt. This may help treat and prevent yeast infections.  GET HELP RIGHT AWAY IF:   · You have a fever.  · Your problems get worse during treatment or do not get better in 3 days.  · You have discomfort, irritation, or itching in your vagina or vulva area.  · You have pain after sex.  · You start to get belly (abdominal) pain.  MAKE SURE YOU:  · Understand these instructions.  · Will watch your condition.  · Will get help right away if you are not doing well or get worse.  Document Released: 03/06/2009 Document Revised: 03/01/2012 Document Reviewed: 03/06/2009  [employer]® Patient Information ©2014 [employer].

## 2014-04-28 NOTE — Progress Notes (Signed)
Pre visit review using our clinic review tool, if applicable. No additional management support is needed unless otherwise documented below in the visit note. 

## 2014-06-15 ENCOUNTER — Ambulatory Visit: Payer: Self-pay | Admitting: Gastroenterology

## 2014-07-21 ENCOUNTER — Ambulatory Visit (INDEPENDENT_AMBULATORY_CARE_PROVIDER_SITE_OTHER): Payer: Self-pay | Admitting: Family Medicine

## 2014-07-21 ENCOUNTER — Encounter: Payer: Self-pay | Admitting: Family Medicine

## 2014-07-21 VITALS — BP 92/64 | HR 67 | Temp 98.2°F | Ht 61.0 in | Wt 106.8 lb

## 2014-07-21 DIAGNOSIS — R319 Hematuria, unspecified: Secondary | ICD-10-CM

## 2014-07-21 DIAGNOSIS — N390 Urinary tract infection, site not specified: Secondary | ICD-10-CM | POA: Insufficient documentation

## 2014-07-21 DIAGNOSIS — N3090 Cystitis, unspecified without hematuria: Secondary | ICD-10-CM

## 2014-07-21 LAB — POCT URINALYSIS DIPSTICK
BILIRUBIN UA: NEGATIVE
Glucose, UA: NEGATIVE
Ketones, UA: NEGATIVE
NITRITE UA: NEGATIVE
Spec Grav, UA: 1.02
UROBILINOGEN UA: 0.2
pH, UA: 6

## 2014-07-21 LAB — POCT URINE PREGNANCY: PREG TEST UR: NEGATIVE

## 2014-07-21 MED ORDER — SULFAMETHOXAZOLE-TMP DS 800-160 MG PO TABS: 1.0000 | ORAL_TABLET | Freq: Two times a day (BID) | ORAL | Status: DC

## 2014-07-21 NOTE — Patient Instructions (Signed)
Drink lots of water  Take the bactrim as directed for urinary tract infection  Go to the health dept for your gyn care/pelvic pain   Update if not starting to improve in a week or if worsening

## 2014-07-21 NOTE — Progress Notes (Signed)
Pre visit review using our clinic review tool, if applicable. No additional management support is needed unless otherwise documented below in the visit note. 

## 2014-07-21 NOTE — Progress Notes (Signed)
Subjective:    Patient ID: [name], female    DOB: [birth year], 28 y.o.   MRN: [id number]  HPI Here for uti Symptoms for 3 weeks  Pos ua Frequency and urgency -constantly No burning  A pink tint to urine- blood  Twice she has had pain after intercourse in R low abd -up to flank - hx of endometriosis  She has also had cysts   Not on any birth control right now  LMP - July 12-15th  She does not use condoms --no birth control for years   No n/v/fever   Needs a gyn  Needs someone who will see her without insurance   Patient Active Problem List   Diagnosis Date Noted  . Unspecified constipation 04/01/2014  . Alcohol dependence, binge pattern 03/30/2014  . Acute hepatitis 03/27/2014  . Hemorrhoids 04/16/2012  . Rectal bleeding 04/16/2012  . GASTRITIS 01/30/2009  . ANEMIA 08/24/2008  . ANXIETY 08/24/2008  . GERD 08/24/2008  . ALCOHOL ABUSE, HX OF 08/24/2008  . NEPHROLITHIASIS 08/16/2008  . TOBACCO ABUSE 05/19/2008  . UTI 03/21/2008  . RENAL CALCULUS, HX OF 03/21/2008   Past Medical History  Diagnosis Date  . Endometriosis   . Lumbar facet arthropathy   . UTI (lower urinary tract infection)   . History of suicide attempt WRIST CUTTINE    PT STATES LAST TIME AGE 28--  NO ATTEMPTS SINCE--  RECEIVED COUNSELING  . History of alcohol abuse AS TEEN  . OA (osteoarthritis of spine) LUMBAR --  PER PT  . History of major depression AGE 28-  SUICIDE ATTEMPT    PT STATES NO PROBLEMS SINCE  . CIN III (cervical intraepithelial neoplasia III)   . Acid reflux OCCASIONAL  . Frequency of urination     nocturia, hematuria, urgency   . History of kidney stones     ureteral stone right   Past Surgical History  Procedure Laterality Date  . Cervical biopsy  w/ loop electrode excision  FEB  2013    CIN III  (GYN OFFICE)  . Wisdom tooth extraction  2012    ORAL SURGEON OFFICE  . Orif right little finger fx  2006  . Cystoscopy with ureteroscopy  04/20/2012    Procedure:  CYSTOSCOPY WITH URETEROSCOPY;  Surgeon: Antony HasteMatthew Ramsey Eskridge, MD;  [facility] ;  Service: Urology;  Laterality: N/A;  CYSTO, RIGHT URETEROSCOPY ,  STONE BASKET EXTRACTION   CARM LASER     History  Substance Use Topics  . Smoking status: Current Every Day Smoker -- 0.50 packs/day for 8 years    Types: Cigarettes  . Smokeless tobacco: Never Used  . Alcohol Use: Yes   Family History  Problem Relation Age of Onset  . Cancer Mother   . Heart failure Father   . Hepatitis      Autoimmune treated with prednisone   Allergies  Allergen Reactions  . Codeine Itching   No current outpatient prescriptions on file prior to visit.   No current facility-administered medications on file prior to visit.         Results for orders placed in visit on 07/21/14  POCT URINALYSIS DIPSTICK      Result Value Ref Range   Color, UA yellow     Clarity, UA cloudy     Glucose, UA neg.     Bilirubin, UA neg.     Ketones, UA neg.     Spec Grav, UA 1.020     Blood,  UA Moderate     pH, UA 6.0     Protein, UA Trace     Urobilinogen, UA 0.2     Nitrite, UA neg.     Leukocytes, UA moderate (2+)       Upreg neg   Review of Systems Review of Systems  Constitutional: Negative for fever, appetite change, fatigue and unexpected weight change.  Eyes: Negative for pain and visual disturbance.  Respiratory: Negative for cough and shortness of breath.   Cardiovascular: Negative for cp or palpitations    Gastrointestinal: Negative for nausea, diarrhea and constipation.  Genitourinary: pos for urgency and frequency. pos for hematuria pos for pelvic pain after intercourse with hx of endometriosis Skin: Negative for pallor or rash   Neurological: Negative for weakness, light-headedness, numbness and headaches.  Hematological: Negative for adenopathy. Does not bruise/bleed easily.  Psychiatric/Behavioral: Negative for dysphoric mood. The patient is not nervous/anxious.           Objective:   Physical Exam  Constitutional: She appears well-developed and well-nourished. No distress.  HENT:  Head: Normocephalic and atraumatic.  Eyes: Conjunctivae and EOM are normal. Pupils are equal, round, and reactive to light. No scleral icterus.  Neck: Normal range of motion. Neck supple.  Cardiovascular: Normal rate and regular rhythm.   Pulmonary/Chest: Effort normal and breath sounds normal.  Abdominal: Soft. Bowel sounds are normal. She exhibits no distension and no mass. There is tenderness. There is no rebound and no guarding.  Mild suprapubic tenderness  No cva tenderness   Lymphadenopathy:    She has no cervical adenopathy.  Neurological: She is alert.  Skin: Skin is warm and dry. No pallor.  Psychiatric: She has a normal mood and affect.          Assessment & Plan:   Problem List Items Addressed This Visit     Genitourinary   Cystitis     Cover with bactrim Pt dose not have insurance - will hold off on urine cx and she will call if symptoms do not improve  Urged to drink water Use sunblock as abx will cause her to more easily sunburn  She will also go to the health dept for gyn care-[facility] and has hx of endometriosis     Relevant Orders      POCT urine pregnancy (Completed)    Other Visit Diagnoses   Hematuria    -  Primary    Relevant Orders       POCT urinalysis dipstick (Completed)       POCT urine pregnancy (Completed)

## 2014-07-21 NOTE — Assessment & Plan Note (Signed)
Cover with bactrim Pt dose not have insurance - will hold off on urine cx and she will call if symptoms do not improve  Urged to drink water Use sunblock as abx will cause her to more easily sunburn  She will also go to the health dept for gyn care-[facility] and has hx of endometriosis

## 2014-07-28 ENCOUNTER — Telehealth: Payer: Self-pay | Admitting: Family Medicine

## 2014-07-28 NOTE — Telephone Encounter (Signed)
Patient Information:  Caller Name: Leeroy BockChelsea  Phone: [phone]  Patient: [name], [name]  Gender: Female  DOB: September 18, 1986  Age: 28 Years  PCP: [facility])  Pregnant: No  Office Follow Up:  Does the office need to follow up with this patient?: Yes  Instructions For The Office: Please call and advise   Symptoms  Reason For Call & Symptoms: Pt broke out in a rash while taking Bactrim for a UTI. She was prescribed last Monday  06/23/14 - Bactrim DS 1 tab po BID x 7 days -she started the medication on 07/21/14. Pt broke out in a widespread rash all over her body. The rash is red/small and raised and itchy. Pt has no insurance so does not want to come in if she doesn't have to. The symptoms of her UTI have resolved. RN did advise either Claritin or Zyrtec with 1% HC cream applied to rash or Benadryl 50mg  po q6h prn for itching.  And baking soda bath.  Reviewed Health History In EMR: Yes  Reviewed Medications In EMR: Yes  Reviewed Allergies In EMR: Yes  Reviewed Surgeries / Procedures: Yes  Date of Onset of Symptoms: 07/24/2014 OB / GYN:  LMP: 07/19/2014  Guideline(s) Used:  Rash or Redness - Widespread  Rash - Widespread on Drugs - Drug Reaction  Disposition Per Guideline:   See Today in Office  Reason For Disposition Reached:   All other patients with widespread rash taking a new medication  Advice Given:  N/A  Patient Refused Recommendation:  Patient Refused Care Advice  Pt has no insurance and owes the office money so she didn't want to schedule an appt if not necessary.

## 2014-07-28 NOTE — Telephone Encounter (Signed)
Pt notified of Dr. Royden Purlower's comments/recommendations. Bactrim removed off of med list and added to allergies list.

## 2014-07-28 NOTE — Telephone Encounter (Signed)
Stay off med  Will not start anything new if her uti is better symptom wise  Antihistamines for itch If worse or new symptoms please let me know

## 2015-04-21 ENCOUNTER — Inpatient Hospital Stay (HOSPITAL_COMMUNITY)
Admission: AD | Admit: 2015-04-21 | Discharge: 2015-04-21 | Disposition: A | Discharge status: Home / Self Care | Payer: Self-pay | Source: Ambulatory Visit | Attending: Family Medicine | Admitting: Family Medicine

## 2015-04-21 ENCOUNTER — Encounter (HOSPITAL_COMMUNITY): Payer: Self-pay | Admitting: *Deleted

## 2015-04-21 DIAGNOSIS — N9089 Other specified noninflammatory disorders of vulva and perineum: Secondary | ICD-10-CM

## 2015-04-21 DIAGNOSIS — F17210 Nicotine dependence, cigarettes, uncomplicated: Secondary | ICD-10-CM | POA: Insufficient documentation

## 2015-04-21 DIAGNOSIS — N909 Noninflammatory disorder of vulva and perineum, unspecified: Secondary | ICD-10-CM | POA: Insufficient documentation

## 2015-04-21 DIAGNOSIS — A6004 Herpesviral vulvovaginitis: Secondary | ICD-10-CM

## 2015-04-21 LAB — URINALYSIS, ROUTINE W REFLEX MICROSCOPIC
Bilirubin Urine: NEGATIVE
Glucose, UA: NEGATIVE mg/dL
Ketones, ur: NEGATIVE mg/dL
Leukocytes, UA: NEGATIVE
Nitrite: NEGATIVE
PROTEIN: NEGATIVE mg/dL
Specific Gravity, Urine: 1.01 (ref 1.005–1.030)
Urobilinogen, UA: 0.2 mg/dL (ref 0.0–1.0)
pH: 6.5 (ref 5.0–8.0)

## 2015-04-21 LAB — POCT PREGNANCY, URINE: Preg Test, Ur: NEGATIVE

## 2015-04-21 LAB — URINE MICROSCOPIC-ADD ON

## 2015-04-21 MED ORDER — ACYCLOVIR 400 MG PO TABS: 400.0000 mg | ORAL_TABLET | Freq: Three times a day (TID) | ORAL | Status: DC

## 2015-04-21 MED ORDER — LIDOCAINE 5 % EX OINT
TOPICAL_OINTMENT | Freq: Once | CUTANEOUS | Status: AC
  Administered 2015-04-21: 18:00:00 via TOPICAL
  Filled 2015-04-21: qty 35.44

## 2015-04-21 NOTE — MAU Note (Signed)
Here for eval of blisters on vagina

## 2015-04-21 NOTE — MAU Provider Note (Signed)
History     CSN: [id number]  Arrival date and time: 04/21/15 1552   First Provider Initiated Contact with Patient 04/21/15 1642      Chief Complaint  Patient presents with  . Hematuria   HPI 29 y.o. [id number] with vaginal pain x 5 days, pt's partner noticed "blisters" in vaginal area 2 days ago, painful. Has never had lesions like this before. Pt's partner of 11 years has oral HSV, has never had genital symptoms. Also noticed some blood in her urine, which has resolved, now having menses.   Past Medical History  Diagnosis Date  . Endometriosis   . Lumbar facet arthropathy   . UTI (lower urinary tract infection)   . History of suicide attempt WRIST CUTTINE    PT STATES LAST TIME AGE 29--  NO ATTEMPTS SINCE--  RECEIVED COUNSELING  . History of alcohol abuse AS TEEN  . OA (osteoarthritis of spine) LUMBAR --  PER PT  . History of major depression AGE 29-  SUICIDE ATTEMPT    PT STATES NO PROBLEMS SINCE  . CIN III (cervical intraepithelial neoplasia III)   . Acid reflux OCCASIONAL  . Frequency of urination     nocturia, hematuria, urgency   . History of kidney stones     ureteral stone right    Past Surgical History  Procedure Laterality Date  . Cervical biopsy  w/ loop electrode excision  FEB  2013    CIN III  (GYN OFFICE)  . Wisdom tooth extraction  2012    ORAL SURGEON OFFICE  . Orif right little finger fx  2006  . Cystoscopy with ureteroscopy  04/20/2012    Procedure: CYSTOSCOPY WITH URETEROSCOPY;  Surgeon: Antony HasteMatthew Ramsey Eskridge, MD;  [facility];  Service: Urology;  Laterality: N/A;  CYSTO, RIGHT URETEROSCOPY ,  STONE BASKET EXTRACTION   CARM LASER      Family History  Problem Relation Age of Onset  . Cancer Mother   . Heart failure Father   . Hepatitis      Autoimmune treated with prednisone    History  Substance Use Topics  . Smoking status: Current Every Day Smoker -- 0.50 packs/day for 8 years    Types: Cigarettes  . Smokeless  tobacco: Never Used  . Alcohol Use: Yes    Allergies:  Allergies  Allergen Reactions  . Bactrim [Sulfamethoxazole-Trimethoprim] Rash    No prescriptions prior to admission    Review of Systems  Constitutional: Negative.   Respiratory: Negative.   Cardiovascular: Negative.   Gastrointestinal: Negative for nausea, vomiting, abdominal pain, diarrhea and constipation.  Genitourinary: Negative for dysuria, urgency, frequency, hematuria and flank pain.  Musculoskeletal: Negative.   Neurological: Negative.   Psychiatric/Behavioral: Negative.    Physical Exam   Blood pressure 116/72, pulse 78, temperature 98 F (36.7 C), resp. rate 16, height 5\' 1"  (1.549 m), weight 101 lb (45.813 kg), last menstrual period 04/21/2015.  Physical Exam  Nursing note and vitals reviewed. Constitutional: She is oriented to person, place, and time. She appears well-developed and well-nourished. No distress.  HENT:  Head: Normocephalic and atraumatic.  Cardiovascular: Normal rate, regular rhythm and normal heart sounds.   Respiratory: Effort normal and breath sounds normal. No respiratory distress.  GI: Soft. Bowel sounds are normal. She exhibits no distension and no mass. There is no tenderness. There is no rebound and no guarding.  Genitourinary:    There is no rash or lesion on the right labia. There is lesion on  the left labia. There is no rash on the left labia. Uterus is not deviated, not enlarged, not fixed and not tender. Cervix exhibits no motion tenderness, no discharge and no friability. Right adnexum displays no mass, no tenderness and no fullness. Left adnexum displays no mass, no tenderness and no fullness. No erythema, tenderness or bleeding in the vagina. No vaginal discharge found.  Neurological: She is alert and oriented to person, place, and time.  Skin: Skin is warm and dry.  Psychiatric: She has a normal mood and affect.    MAU Course  Procedures  Results for orders placed or  performed during the hospital encounter of 04/21/15 (from the past 24 hour(s))  Urinalysis, Routine w reflex microscopic     Status: Abnormal   Collection Time: 04/21/15  4:23 PM  Result Value Ref Range   Color, Urine YELLOW YELLOW   APPearance CLEAR CLEAR   Specific Gravity, Urine 1.010 1.005 - 1.030   pH 6.5 5.0 - 8.0   Glucose, UA NEGATIVE NEGATIVE mg/dL   Hgb urine dipstick MODERATE (A) NEGATIVE   Bilirubin Urine NEGATIVE NEGATIVE   Ketones, ur NEGATIVE NEGATIVE mg/dL   Protein, ur NEGATIVE NEGATIVE mg/dL   Urobilinogen, UA 0.2 0.0 - 1.0 mg/dL   Nitrite NEGATIVE NEGATIVE   Leukocytes, UA NEGATIVE NEGATIVE  Urine microscopic-add on     Status: Abnormal   Collection Time: 04/21/15  4:23 PM  Result Value Ref Range   Squamous Epithelial / LPF FEW (A) RARE   WBC, UA 0-2 <3 WBC/hpf   RBC / HPF 0-2 <3 RBC/hpf   Bacteria, UA RARE RARE  Pregnancy, urine POC     Status: None   Collection Time: 04/21/15  4:25 PM  Result Value Ref Range   Preg Test, Ur NEGATIVE NEGATIVE   HSV culture collected.  Lidocaine 5% ointment to vulvar area in MAU.  Assessment and Plan   1. 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     acyclovir 400 MG tablet  Commonly known as:  ZOVIRAX  Take 1 tablet (400 mg total) by mouth 3 (three) times daily.     lansoprazole 30 MG capsule  Commonly known as:  PREVACID  Take 30 mg by mouth daily as needed (heartburn).        Follow-up Information    Follow up with [facility].   Specialty:  Obstetrics and Gynecology   Why:  As needed   Contact information:   [address] [place] [address] [phone]      Follow up with [facility].   Why:  As  needed   Contact information:    [address] [place], [place] [address] [phone]        [facility] 04/21/2015, 6:26 PM

## 2015-04-21 NOTE — MAU Note (Signed)
Pt presents to MAU with complaints of blood in her urine with red bumps on her vaginal area.

## 2015-04-23 LAB — HERPES SIMPLEX VIRUS CULTURE
Culture: DETECTED
SPECIAL REQUESTS: NORMAL

## 2015-06-26 ENCOUNTER — Telehealth: Payer: Self-pay | Admitting: *Deleted

## 2015-06-26 NOTE — Telephone Encounter (Signed)
I think she is talking about Naltrexone injections? If not -let me know.  We will discuss at her visit.  Thanks for the heads up

## 2015-06-26 NOTE — Telephone Encounter (Signed)
Mother advise me that pt is having a hard time with alcohol abuse and wants to discuss treatment options. Pt went to a center that advise her that there is a new injections that can help with the triggers and craving. Pt wants to discuss her treatment options and that new injections with you before she decides, appt scheduled on Monday 07/02/15 (30 min)

## 2015-07-02 ENCOUNTER — Ambulatory Visit (INDEPENDENT_AMBULATORY_CARE_PROVIDER_SITE_OTHER): Payer: Self-pay | Admitting: Family Medicine

## 2015-07-02 ENCOUNTER — Encounter: Payer: Self-pay | Admitting: Family Medicine

## 2015-07-02 VITALS — BP 96/64 | HR 57 | Temp 98.4°F | Ht 61.0 in | Wt 102.0 lb

## 2015-07-02 DIAGNOSIS — R748 Abnormal levels of other serum enzymes: Secondary | ICD-10-CM | POA: Insufficient documentation

## 2015-07-02 DIAGNOSIS — F1020 Alcohol dependence, uncomplicated: Secondary | ICD-10-CM

## 2015-07-02 DIAGNOSIS — F411 Generalized anxiety disorder: Secondary | ICD-10-CM

## 2015-07-02 LAB — HEPATIC FUNCTION PANEL
ALBUMIN: 4.2 g/dL (ref 3.5–5.2)
ALT: 16 U/L (ref 0–35)
AST: 21 U/L (ref 0–37)
Alkaline Phosphatase: 42 U/L (ref 39–117)
Bilirubin, Direct: 0.1 mg/dL (ref 0.0–0.3)
Total Bilirubin: 0.6 mg/dL (ref 0.2–1.2)
Total Protein: 6.8 g/dL (ref 6.0–8.3)

## 2015-07-02 MED ORDER — NALTREXONE HCL 50 MG PO TABS: 50.0000 mg | ORAL_TABLET | Freq: Every day | ORAL | Status: DC

## 2015-07-02 NOTE — Progress Notes (Signed)
Subjective:    Patient ID: [name], female    DOB: 1986/04/08, 29 y.o.   MRN: [id number]  HPI Pt is interested in getting vivitrol inj for alcohol dependence   Has had alcohol problems for a long time  Can quit for a while  Then binges - can be anywhere from daily to monthly  Last use was 11 day   Usually 4- 15 drinks at a time  Will black out with larger amounts   Has not tried nalrexone orally   She gets quick craving to drink - without trigger Thinks anxiety plays a role  ? How stressors right now   Was on med in the past  Alprazolam helped in the past  Zoloft/lexapro -several things - until she lost her insurance  She does not sleep well - all her life   All symptoms since mid teens   Father - alcoholism   Has been to counseling/ rehab /12 step  Has had withdrawal  Never had a seizure however   Counseling helped more than anything   No insurance right now  Plans to go back now   Lab Results  Component Value Date   ALT 154* 04/13/2014   AST 40* 04/13/2014   ALKPHOS 69 04/13/2014   BILITOT 0.9 04/13/2014   that is when she had the biopsy   Still smoking as well   Patient Active Problem List   Diagnosis Date Noted  . UTI (urinary tract infection) 07/21/2014  . Cystitis 07/21/2014  . Unspecified constipation 04/01/2014  . Alcohol dependence, binge pattern 03/30/2014  . Acute hepatitis 03/27/2014  . Hemorrhoids 04/16/2012  . Rectal bleeding 04/16/2012  . GASTRITIS 01/30/2009  . ANEMIA 08/24/2008  . ANXIETY 08/24/2008  . GERD 08/24/2008  . ALCOHOL ABUSE, HX OF 08/24/2008  . NEPHROLITHIASIS 08/16/2008  . TOBACCO ABUSE 05/19/2008  . UTI 03/21/2008  . RENAL CALCULUS, HX OF 03/21/2008   Past Medical History  Diagnosis Date  . Endometriosis   . Lumbar facet arthropathy   . UTI (lower urinary tract infection)   . History of suicide attempt WRIST CUTTINE    PT STATES LAST TIME AGE 81--  NO ATTEMPTS SINCE--  RECEIVED COUNSELING  . History  of alcohol abuse AS TEEN  . OA (osteoarthritis of spine) LUMBAR --  PER PT  . History of major depression AGE 81-  SUICIDE ATTEMPT    PT STATES NO PROBLEMS SINCE  . CIN III (cervical intraepithelial neoplasia III)   . Acid reflux OCCASIONAL  . Frequency of urination     nocturia, hematuria, urgency   . History of kidney stones     ureteral stone right   Past Surgical History  Procedure Laterality Date  . Cervical biopsy  w/ loop electrode excision  FEB  2013    CIN III  (GYN OFFICE)  . Wisdom tooth extraction  2012    ORAL SURGEON OFFICE  . Orif right little finger fx  2006  . Cystoscopy with ureteroscopy  04/20/2012    Procedure: CYSTOSCOPY WITH URETEROSCOPY;  Surgeon: Antony Haste, MD;  Location: [facility];  Service: Urology;  Laterality: N/A;  CYSTO, RIGHT URETEROSCOPY ,  STONE BASKET EXTRACTION   CARM LASER     History  Substance Use Topics  . Smoking status: Current Every Day Smoker -- 0.50 packs/day for 8 years    Types: Cigarettes  . Smokeless tobacco: Never Used  . Alcohol Use: 0.0 oz/week    0  Standard drinks or equivalent per week   Family History  Problem Relation Age of Onset  . Cancer Mother   . Heart failure Father   . Hepatitis      Autoimmune treated with prednisone   Allergies  Allergen Reactions  . Bactrim [Sulfamethoxazole-Trimethoprim] Rash   Current Outpatient Prescriptions on File Prior to Visit  Medication Sig Dispense Refill  . lansoprazole (PREVACID) 30 MG capsule Take 30 mg by mouth daily as needed (heartburn).      No current facility-administered medications on file prior to visit.     Review of Systems Review of Systems  Constitutional: Negative for fever, appetite change, fatigue and unexpected weight change.  Eyes: Negative for pain and visual disturbance.  Respiratory: Negative for cough and shortness of breath.   Cardiovascular: Negative for cp or palpitations    Gastrointestinal: Negative for nausea,  diarrhea and constipation. neg for abd pain or hematemesis  Genitourinary: Negative for urgency and frequency.  Skin: Negative for pallor or rash   Neurological: Negative for weakness, light-headedness, numbness and headaches.  Hematological: Negative for adenopathy. Does not bruise/bleed easily.  Psychiatric/Behavioral: Negative for dysphoric mood. The patient is nervous/anxious.  neg for SI       Objective:   Physical Exam  Constitutional: She appears well-developed and well-nourished. No distress.  Slim and well but anxious appearing   HENT:  Head: Normocephalic and atraumatic.  Mouth/Throat: Oropharynx is clear and moist.  Eyes: Conjunctivae and EOM are normal. Pupils are equal, round, and reactive to light.  Neck: Normal range of motion. Neck supple. No JVD present. Carotid bruit is not present. No thyromegaly present.  Cardiovascular: Normal rate, regular rhythm, normal heart sounds and intact distal pulses.  Exam reveals no gallop.   Pulmonary/Chest: Effort normal and breath sounds normal. No respiratory distress. She has no wheezes. She has no rales.  No crackles  Abdominal: Soft. Bowel sounds are normal. She exhibits no distension, no abdominal bruit and no mass. There is no tenderness.  No HSM today  Musculoskeletal: She exhibits no edema.  Lymphadenopathy:    She has no cervical adenopathy.  Neurological: She is alert. She has normal reflexes. No cranial nerve deficit. She exhibits normal muscle tone. Coordination normal.  No tremor today  Skin: Skin is warm and dry. No rash noted. No erythema.  No jaundice   Psychiatric: Her speech is normal and behavior is normal. Thought content normal. Her mood appears anxious. Her affect is not blunt, not labile and not inappropriate. Thought content is not paranoid. Cognition and memory are normal. She does not exhibit a depressed mood. She expresses no homicidal and no suicidal ideation.          Assessment & Plan:   Problem  List Items Addressed This Visit    Alcohol dependence, binge pattern - Primary    Long disc re: hx and things tried to quit incl counseling/rehab and 12 step programs Reviewed stressors/ coping techniques/symptoms/ support sources/ tx options and side effects in detail today  Interested in naltrexone - if LFTs are ok -will try 50 mg daily orally (rev common side eff and how and why it works) Interested in monthly shot - unsure if she could afford it  Disc expectations F/u planned She will continue alcohol counseling at her clinic and consider returning to psychiatrist for further help with anx  Currently no etoh in 11 d  Has good support        Elevated liver enzymes  Pt has hx of non viral hepatitis in the past and has hx of alcoholism  Test today  Has not been able to afford GI f/u  Counseled on etoh cessation and she very much wants to start        Relevant Orders   Hepatic function panel (Completed)   Generalized anxiety disorder    Enc pt to continue care at the clinic she has gone to with SS payments (has seen a counselor for stress and alcohol issues) and has access to a psychiatrist  Has failed SSRI thus far

## 2015-07-02 NOTE — Progress Notes (Signed)
Pre visit review using our clinic review tool, if applicable. No additional management support is needed unless otherwise documented below in the visit note. 

## 2015-07-02 NOTE — Assessment & Plan Note (Signed)
Enc pt [facility] (has seen a counselor for stress and alcohol issues) and has access to a psychiatrist  Has failed SSRI thus far

## 2015-07-02 NOTE — Patient Instructions (Signed)
Call several pharmacies to check on the cost of oral naltrexone  Drink lots of fluids Go back to counseling as well  Pursue affordable treatment for anxiety if possible  If liver tests come back normal - then you can start the naletrexone 50 mg daily and follow up with me in about 2 months

## 2015-07-02 NOTE — Assessment & Plan Note (Signed)
Pt has hx of non viral hepatitis in the past and has hx of alcoholism  Test today  Has not been able to afford GI f/u  Counseled on etoh cessation and she very much wants to start

## 2015-07-02 NOTE — Assessment & Plan Note (Signed)
Long disc re: hx and things tried to quit incl counseling/rehab and 12 step programs Reviewed stressors/ coping techniques/symptoms/ support sources/ tx options and side effects in detail today  Interested in naltrexone - if LFTs are ok -will try 50 mg daily orally (rev common side eff and how and why it works) Interested in monthly shot - unsure if she could afford it  Disc expectations F/u planned She will continue alcohol counseling at her clinic and consider returning to psychiatrist for further help with anx  Currently no etoh in 11 d  Has good support

## 2016-03-20 ENCOUNTER — Encounter: Payer: Self-pay | Admitting: Family Medicine

## 2016-03-20 ENCOUNTER — Ambulatory Visit: Payer: Self-pay | Admitting: Family Medicine

## 2016-03-20 ENCOUNTER — Ambulatory Visit (INDEPENDENT_AMBULATORY_CARE_PROVIDER_SITE_OTHER): Payer: Self-pay | Admitting: Family Medicine

## 2016-03-20 VITALS — BP 102/60 | HR 92 | Temp 97.5°F | Wt 92.2 lb

## 2016-03-20 DIAGNOSIS — K120 Recurrent oral aphthae: Secondary | ICD-10-CM

## 2016-03-20 DIAGNOSIS — F411 Generalized anxiety disorder: Secondary | ICD-10-CM

## 2016-03-20 MED ORDER — HYDROXYZINE HCL 10 MG PO TABS: 10.0000 mg | ORAL_TABLET | Freq: Two times a day (BID) | ORAL | Status: DC | PRN

## 2016-03-20 MED ORDER — BUSPIRONE HCL 5 MG PO TABS: 5.0000 mg | ORAL_TABLET | Freq: Every day | ORAL | Status: DC

## 2016-03-20 MED ORDER — TRIAMCINOLONE ACETONIDE 0.1 % MT PSTE
1.0000 | PASTE | Freq: Two times a day (BID) | OROMUCOSAL | Status: DC
Start: 2016-03-20 — End: 2016-11-03

## 2016-03-20 NOTE — Patient Instructions (Addendum)
These spots look like aphthous ulcers - try salt water gargles or orabase gel for symptomatic relief. Should improve over time.  For anxiety - continue working with counselor. Continue working on healthy stress relieving strategies. Start hydroxyzine low dose nightly for sleep and for anxiety. If needing more help with anxiety during the day, start low dose buspar  daily and may increase to twice daily if tolerated well.  Follow up with PCP over next few months.   Canker Sores Canker sores are small, painful sores that develop inside your mouth. They may also be called aphthous ulcers. You can get canker sores on the inside of your lips or cheeks, on your tongue, or anywhere inside your mouth. You can have just one canker sore or several of them. Canker sores cannot be passed from one person to another (noncontagious). These sores are different than the sores that you may get on the outside of your lips (cold sores or fever blisters). Canker sores usually start as painful red bumps. Then they turn into small white, yellow, or gray ulcers that have red borders. The ulcers may be quite painful. The pain may be worse when you eat or drink. CAUSES The cause of this condition is not known. RISK FACTORS This condition is more likely to develop in:  Women.  People in their teens or 80s.  Women who are having their menstrual period.  People who are under a lot of emotional stress.  People who do not get enough iron or B vitamins.  People who have poor oral hygiene.  People who have an injury inside the mouth. This can happen after having dental work or from chewing something hard. SYMPTOMS Along with the canker sore, symptoms may also include:  Fever.  Fatigue.  Swollen lymph nodes in your neck. DIAGNOSIS This condition can be diagnosed based on your symptoms. Your health care provider will also examine your mouth. Your health care provider may also do tests if you get canker sores often  or if they are very bad. Tests may include:  Blood tests to rule out other causes of canker sores.  Taking swabs from the sore to check for infection.  Taking a small piece of skin from the sore (biopsy) to test it for cancer. TREATMENT Most canker sores clear up without treatment in about 10 days. Home care is usually the only treatment that you will need. Over-the-counter medicines can relieve discomfort.If you have severe canker sores, your health care provider may prescribe:  Numbing ointment to relieve pain.  Vitamins.  Steroid medicines. These may be given as:  Oral pills.  Mouth rinses.  Gels.  Antibiotic mouth rinse. HOME CARE INSTRUCTIONS  Apply, take, or use medicines only as directed by your health care provider. These include vitamins.  If you were prescribed an antibiotic mouth rinse, finish all of it even if you start to feel better.  Until the sores are healed:  Do not drink coffee or citrus juices.  Do not eat spicy or salty foods.  Use a mild, over-the-counter mouth rinse as directed by your health care provider.  Practice good oral hygiene.  Floss your teeth every day.  Brush your teeth with a soft brush twice each day. SEEK MEDICAL CARE IF:  Your symptoms do not get better after two weeks.  You also have a fever or swollen glands.  You get canker sores often.  You have a canker sore that is getting larger.  You cannot eat or drink  due to your canker sores.   This information is not intended to replace advice given to you by your health care provider. Make sure you discuss any questions you have with your health care provider.   Document Released: 04/04/2011 Document Revised: 04/24/2015 Document Reviewed: 11/08/2014 Elsevier Interactive Patient Education [employer]2016 [employer] Inc.

## 2016-03-20 NOTE — Assessment & Plan Note (Addendum)
With anxiety attacks and insomnia. Discussed treatment options. Recommended continue counselor, discussed healthy stress relieving strategies, will trial hydroxyzine 10mg  nightly to BID PRN anxiety attack, and if needed start buspar 5mg  nightly with option to increase to BID.  rec f/u with PCP over next few months.  With history I don't feel comfortable prescribing controlled substances for anxiety.

## 2016-03-20 NOTE — Progress Notes (Signed)
   BP 102/60 mmHg  Pulse 92  Temp(Src) 97.5 F (36.4 C) (Oral)  Wt 92 lb 4 oz (41.844 kg)  SpO2 96%   CC: bumps on lips  Subjective:    Patient ID: [name], female    DOB: [birth year], 30 y.o.   MRN: [id number]  HPI: [name] is a 30 y.o. female presenting on 03/20/2016 for bumps; trouble focusing; and Anxiety   4d ago noticed bumps on gumline. Getting more painful, this feels different from thrush. No other skin rashes. No vaginal ulcers.   Also endorses trouble sleeping, worsening anxiety (since 30 yo), loss of focus. Chronic problem. Feels this is worsening. Affecting ability to function at home and at work. Interested in pharmacotherapy. Feels xanax works best.  Current every day smoker, 1/2-1 ppd.  EtOH - recovering alcoholic. Denies any alcohol - last drink was 2 yrs ago.  rec drugs - not currently. Has taken xanax from aunt.   H/o alcohol abuse with binging. Last saw PCP 06/2015.   [facility] and sees GYN. Has been on xanax as well as zoloft, lexapro.  Has seen psychiatrist and counselor in the past. Last saw counselor 11/2015 and psychiatrist years ago.   Works as Interior and spatial designerhairdresser.   Relevant past medical, surgical, family and social history reviewed and updated as indicated. Interim medical history since our last visit reviewed. Allergies and medications reviewed and updated. Current Outpatient Prescriptions on File Prior to Visit  Medication Sig  . lansoprazole (PREVACID) 30 MG capsule Take 30 mg by mouth daily as needed (heartburn).    No current facility-administered medications on file prior to visit.    Review of Systems Per HPI unless specifically indicated in ROS section     Objective:    BP 102/60 mmHg  Pulse 92  Temp(Src) 97.5 F (36.4 C) (Oral)  Wt 92 lb 4 oz (41.844 kg)  SpO2 96%  Wt Readings from Last 3 Encounters:  03/20/16 92 lb 4 oz (41.844 kg)  07/02/15 102 lb (46.267 kg)  04/21/15 101 lb (45.813 kg)      Physical Exam  Constitutional: She appears well-developed and well-nourished. No distress.  HENT:  Mouth/Throat: Oropharynx is clear and moist. No oropharyngeal exudate.  Shallow grey ulcers at inferior anterior gumline  Psychiatric: Her speech is normal and behavior is normal. Thought content normal. Her mood appears anxious.  Nursing note and vitals reviewed.     Assessment & Plan:   Problem List Items Addressed This Visit    Generalized anxiety disorder    With anxiety attacks and insomnia. Discussed treatment options. Recommended continue counselor, discussed healthy stress relieving strategies, will trial hydroxyzine 10mg  nightly to BID PRN anxiety attack, and if needed start buspar 5mg  nightly with option to increase to BID.  rec f/u with PCP over next few months.  With history I don't feel comfortable prescribing controlled substances for anxiety.      Aphthous ulcer - Primary    rec salt water gargles and tci in orabase gel          Follow up plan: Return in about 1 month (around 04/20/2016), or as needed, for follow up visit.

## 2016-03-20 NOTE — Assessment & Plan Note (Signed)
rec salt water gargles and tci in orabase gel

## 2016-03-20 NOTE — Progress Notes (Signed)
Pre visit review using our clinic review tool, if applicable. No additional management support is needed unless otherwise documented below in the visit note. 

## 2016-03-21 ENCOUNTER — Ambulatory Visit: Payer: Self-pay | Admitting: Internal Medicine

## 2016-11-03 ENCOUNTER — Ambulatory Visit (INDEPENDENT_AMBULATORY_CARE_PROVIDER_SITE_OTHER): Payer: Self-pay | Admitting: Family Medicine

## 2016-11-03 VITALS — BP 110/64 | HR 80 | Temp 97.5°F | Resp 16 | Ht 61.0 in | Wt 87.4 lb

## 2016-11-03 DIAGNOSIS — R079 Chest pain, unspecified: Secondary | ICD-10-CM

## 2016-11-03 DIAGNOSIS — R112 Nausea with vomiting, unspecified: Secondary | ICD-10-CM

## 2016-11-03 LAB — POCT CBC
Granulocyte percent: 92.1 %G — AB (ref 37–80)
HEMATOCRIT: 34.2 % — AB (ref 37.7–47.9)
Hemoglobin: 11.7 g/dL — AB (ref 12.2–16.2)
LYMPH, POC: 0.8 (ref 0.6–3.4)
MCH, POC: 28.4 pg (ref 27–31.2)
MCHC: 34.2 g/dL (ref 31.8–35.4)
MCV: 83 fL (ref 80–97)
MID (cbc): 0.3 (ref 0–0.9)
MPV: 7.8 fL (ref 0–99.8)
PLATELET COUNT, POC: 370 10*3/uL (ref 142–424)
POC Granulocyte: 12.1 — AB (ref 2–6.9)
POC LYMPH PERCENT: 5.5 %L — AB (ref 10–50)
POC MID %: 2.1 % (ref 0–12)
RBC: 4.12 M/uL (ref 4.04–5.48)
RDW, POC: 18.3 %
WBC: 13.1 10*3/uL — AB (ref 4.6–10.2)

## 2016-11-03 LAB — LIPASE: Lipase: 16 U/L (ref 7–60)

## 2016-11-03 LAB — POCT URINALYSIS DIP (MANUAL ENTRY)
Bilirubin, UA: NEGATIVE
Glucose, UA: NEGATIVE
Leukocytes, UA: NEGATIVE
Nitrite, UA: NEGATIVE
PH UA: 5.5
Urobilinogen, UA: 0.2

## 2016-11-03 LAB — POCT URINE PREGNANCY: Preg Test, Ur: NEGATIVE

## 2016-11-03 LAB — COMPREHENSIVE METABOLIC PANEL
ALK PHOS: 59 U/L (ref 33–115)
ALT: 30 U/L — AB (ref 6–29)
AST: 35 U/L — AB (ref 10–30)
Albumin: 4.8 g/dL (ref 3.6–5.1)
BILIRUBIN TOTAL: 0.8 mg/dL (ref 0.2–1.2)
BUN: 12 mg/dL (ref 7–25)
CO2: 10 mmol/L — ABNORMAL LOW (ref 20–31)
Calcium: 9.3 mg/dL (ref 8.6–10.2)
Chloride: 104 mmol/L (ref 98–110)
Creat: 0.66 mg/dL (ref 0.50–1.10)
GLUCOSE: 105 mg/dL — AB (ref 65–99)
Potassium: 5 mmol/L (ref 3.5–5.3)
Sodium: 130 mmol/L — ABNORMAL LOW (ref 135–146)
TOTAL PROTEIN: 7.4 g/dL (ref 6.1–8.1)

## 2016-11-03 MED ORDER — ONDANSETRON 8 MG PO TBDP: 8.0000 mg | ORAL_TABLET | Freq: Three times a day (TID) | ORAL | 0 refills | Status: DC | PRN

## 2016-11-03 MED ORDER — ONDANSETRON 4 MG PO TBDP: 8.0000 mg | ORAL_TABLET | Freq: Once | ORAL | Status: DC

## 2016-11-03 NOTE — Progress Notes (Addendum)
Subjective:  By signing my name below, I, [name], attest that this documentation has been prepared under the direction and in the presence of Stephanie SorensonEva Tallula Grindle, MD Electronically Signed: Charline BillsEssence Mccann, ED Scribe 11/03/2016 at 3:11 PM.   Patient ID: [name], female    DOB: October 30, 1986, 30 y.o.   MRN: [id number]  Chief Complaint  Patient presents with  . Nausea    weak, cold/hot chills, vomiting, can't stand very long , out of meds   HPI HPI Comments: Stephanie CoolChelsea R Mccann is a 30 y.o. female who presents to the [facility] complaining of constant light-headedness for the past 2-3 days. Pt states that light-headedness is exacerbated when her eyes are open, standing and walking; improved only with lying. She reports associated symptoms of chills, generalized weakness, sob, swelling in her arms and hands over the past few days without injury, nausea, loss of appetite with her last meal being 2 days ago and emesis with contents of dark green liquid. Pt's mother also states that pt was complaining of non-radiating, central chest pain when she arrived at the pt's home. Pt states that chest pain has resolved at this time. Mother expresses concerns of increased depressed mood; stating that she had not seen or heard from pt in 3 days which is abnormal and that she suspects that pt's boyfriend has been moving away from her. Pt admits to taking a Xanax and 3 alcoholic beverages 2 days ago but denies any alcoholic use for years prior. She states that she stopped smoking 3 days ago and denies h/o alcohol withdrawal. Pt denies congestion, sore throat, hearing loss, tinnitus, abdominal pain. No h/o pancreatic or liver complications. No h/o abdominal surgeries. Pt denies possibility of pregnancy.   Past Medical History:  Diagnosis Date  . Acid reflux OCCASIONAL  . CIN III (cervical intraepithelial neoplasia III)   . Endometriosis   . Frequency of urination    nocturia, hematuria,  urgency   . History of alcohol abuse AS TEEN  . History of kidney stones    ureteral stone right  . History of major depression AGE 73-  SUICIDE ATTEMPT   PT STATES NO PROBLEMS SINCE  . History of suicide attempt WRIST CUTTINE   PT STATES LAST TIME AGE 73--  NO ATTEMPTS SINCE--  RECEIVED COUNSELING  . Lumbar facet arthropathy   . OA (osteoarthritis of spine) LUMBAR --  PER PT  . UTI (lower urinary tract infection)    Current Outpatient Prescriptions on File Prior to Visit  Medication Sig Dispense Refill  . busPIRone (BUSPAR) 5 MG tablet Take 1 tablet (5 mg total) by mouth daily. 60 tablet 3  . hydrOXYzine (ATARAX/VISTARIL) 10 MG tablet Take 1 tablet (10 mg total) by mouth 2 (two) times daily as needed for anxiety. (Patient not taking: Reported on 11/03/2016) 60 tablet 0  . lansoprazole (PREVACID) 30 MG capsule Take 30 mg by mouth daily as needed (heartburn).      No current facility-administered medications on file prior to visit.    Allergies  Allergen Reactions  . Bactrim [Sulfamethoxazole-Trimethoprim] Rash   Review of Systems  Constitutional: Positive for chills.  HENT: Negative for congestion, hearing loss, sore throat and tinnitus.   Respiratory: Positive for shortness of breath.   Cardiovascular: Positive for chest pain (resolved).  Gastrointestinal: Positive for nausea and vomiting. Negative for abdominal pain.  Musculoskeletal: Positive for joint swelling.  Neurological: Positive for weakness.   BP 110/64 (BP Location: Left Arm,  Patient Position: Supine, Cuff Size: Small)   Pulse 80   Temp 97.5 F (36.4 C) (Oral)   Resp 16   Ht 5\' 1"  (1.549 m)   Wt 87 lb 6.4 oz (39.6 kg)   SpO2 100%   BMI 16.51 kg/m     Objective:   Physical Exam  Constitutional: She is oriented to person, place, and time. She appears well-developed. She appears lethargic. She is sleeping. She has a sickly appearance. She appears ill. She appears distressed.  HENT:  Head: Normocephalic and  atraumatic.  Eyes: Conjunctivae and EOM are normal.  Neck: Neck supple. No tracheal deviation present.  Cardiovascular: Normal rate.   Pulmonary/Chest: Effort normal. No respiratory distress.  Abdominal: Normal appearance. Bowel sounds are increased. There is no hepatosplenomegaly. There is generalized tenderness. There is no rigidity, no rebound, no guarding and no CVA tenderness.  Musculoskeletal: Normal range of motion.  Neurological: She is oriented to person, place, and time. She appears lethargic.  Skin: Skin is warm and dry.  Psychiatric: She has a normal mood and affect. Her behavior is normal.  Nursing note and vitals reviewed.  Results for orders placed or performed in visit on 11/03/16  POCT CBC  Result Value Ref Range   WBC 13.1 (A) 4.6 - 10.2 K/uL   Lymph, poc 0.8 0.6 - 3.4   POC LYMPH PERCENT 5.5 (A) 10 - 50 %L   MID (cbc) 0.3 0 - 0.9   POC MID % 2.1 0 - 12 %M   POC Granulocyte 12.1 (A) 2 - 6.9   Granulocyte percent 92.1 (A) 37 - 80 %G   RBC 4.12 4.04 - 5.48 M/uL   Hemoglobin 11.7 (A) 12.2 - 16.2 g/dL   HCT, POC 16.1 (A) 09.6 - 47.9 %   MCV 83.0 80 - 97 fL   MCH, POC 28.4 27 - 31.2 pg   MCHC 34.2 31.8 - 35.4 g/dL   RDW, POC 04.5 %   Platelet Count, POC 370 142 - 424 K/uL   MPV 7.8 0 - 99.8 fL  POCT urinalysis dipstick  Result Value Ref Range   Color, UA yellow yellow   Clarity, UA clear clear   Glucose, UA negative negative   Bilirubin, UA negative negative   Ketones, POC UA >= (160) (A) negative   Spec Grav, UA >=1.030    Blood, UA small (A) negative   pH, UA 5.5    Protein Ur, POC =30 (A) negative   Urobilinogen, UA 0.2    Nitrite, UA Negative Negative   Leukocytes, UA Negative Negative  POCT urine pregnancy  Result Value Ref Range   Preg Test, Ur Negative Negative   Orthostatic VS for the past 24 hrs:  BP- Lying Pulse- Lying BP- Sitting Pulse- Sitting BP- Standing at 0 minutes Pulse- Standing at 0 minutes  11/03/16 1532 122/73 88 114/67 116 119/77  73   EKG: Peaked T in medial chest leads and irregular sinus rhythm.     3L NS IVF given in office with some mild-moderate improvement in sxs Assessment & Plan:  [phone]  Inocencio Homes is pt's mom. Pt gave verbal consent to me today in presence of mother and scribe to discuss pt's medical care with her mother. 1. Intractable vomiting with nausea, unspecified vomiting type   2. Chest pain, unspecified type   3. Nausea and vomiting, intractability of vomiting not specified, unspecified vomiting type    Unfortunately, given pt's history it is most likely that she is going  through withdrawal from alcohol or drugs. However, pt denies any recent use of enough extent to account for this so could be a gastroenteritis. Advised clear liquids only until we are able to r/o pancreatitis with stat labs. Needs to stay sober. To ER if worsens.  Pt with sig dehydration - finally was able to provide a urine after bolusing with 3L NS IVF.  Orders Placed This Encounter  Procedures  . Comprehensive metabolic panel  . Lipase  . Orthostatic vital signs  . POCT CBC  . POCT urinalysis dipstick  . POCT urine pregnancy  . EKG 12-Lead  . Insert peripheral IV    Meds ordered this encounter  Medications  . ondansetron (ZOFRAN-ODT) disintegrating tablet 8 mg  . DISCONTD: ondansetron (ZOFRAN-ODT) 8 MG disintegrating tablet    Sig: Take 1 tablet (8 mg total) by mouth every 8 (eight) hours as needed for nausea or vomiting.    Dispense:  20 tablet    Refill:  0  . ondansetron (ZOFRAN-ODT) 8 MG disintegrating tablet    Sig: Take 1 tablet (8 mg total) by mouth every 8 (eight) hours as needed for nausea or vomiting.    Dispense:  20 tablet    Refill:  0   Over 40 min spent in face-to-face evaluation of and consultation with patient and coordination of care.  Over 50% of this time was spent counseling this patient.   I personally performed the services described in this documentation, which was scribed in my  presence. The recorded information has been reviewed and considered, and addended by me as needed.   Stephanie SorensonEva Devani Odonnel, M.D.  [facility]  [place] [address] [place], [place] [address] [phone] phone [phone] fax  11/20/16 1:57 PM

## 2016-11-03 NOTE — Patient Instructions (Addendum)
IF you received an x-ray today, you will receive an invoice from [facility]. Please contact [facility] at [phone] with questions or concerns regarding your invoice.   IF you received labwork today, you will receive an invoice from [employer]. Please contact [facility] at [phone] with questions or concerns regarding your invoice.   Our billing staff will not be able to assist you with questions regarding bills from these companies.  You will be contacted with the lab results as soon as they are available. The fastest way to get your results is to activate your My Chart account. Instructions are located on the last page of this paperwork. If you have not heard from [place] regarding the results in 2 weeks, please contact this office.      Dehydration, Adult Dehydration is a condition in which you do not have enough fluid or water in your body. It happens when you take in less fluid than you lose. Vital organs such as the kidneys, brain, and heart cannot function without a proper amount of fluids. Any loss of fluids from the body can cause dehydration.  Dehydration can range from mild to severe. This condition should be treated right away to help prevent it from becoming severe. CAUSES  This condition may be caused by:  Vomiting.  Diarrhea.  Excessive sweating, such as when exercising in hot or humid weather.  Not drinking enough fluid during strenuous exercise or during an illness.  Excessive urine output.  Fever.  Certain medicines. RISK FACTORS This condition is more likely to develop in:  People who are taking certain medicines that cause the body to lose excess fluid (diuretics).   People who have a chronic illness, such as diabetes, that may increase urination.  Older adults.   People who live at high altitudes.   People who participate in endurance sports.  SYMPTOMS  Mild Dehydration  Thirst.  Dry  lips.  Slightly dry mouth.  Dry, warm skin. Moderate Dehydration  Very dry mouth.   Muscle cramps.   Dark urine and decreased urine production.   Decreased tear production.   Headache.   Light-headedness, especially when you stand up from a sitting position.  Severe Dehydration  Changes in skin.   Cold and clammy skin.   Skin does not spring back quickly when lightly pinched and released.   Changes in body fluids.   Extreme thirst.   No tears.   Not able to sweat when body temperature is high, such as in hot weather.   Minimal urine production.   Changes in vital signs.   Rapid, weak pulse (more than 100 beats per minute when you are sitting still).   Rapid breathing.   Low blood pressure.   Other changes.   Sunken eyes.   Cold hands and feet.   Confusion.  Lethargy and difficulty being awakened.  Fainting (syncope).   Short-term weight loss.   Unconsciousness. DIAGNOSIS  This condition may be diagnosed based on your symptoms. You may also have tests to determine how severe your dehydration is. These tests may include:   Urine tests.   Blood tests.  TREATMENT  Treatment for this condition depends on the severity. Mild or moderate dehydration can often be treated at home. Treatment should be started right away. Do not wait until dehydration becomes severe. Severe dehydration needs to be treated at the hospital. Treatment for Mild Dehydration  Drinking plenty of water to replace the fluid you have lost.  Replacing minerals in your blood (electrolytes) that you may have lost.  Treatment for Moderate Dehydration  Consuming oral rehydration solution (ORS). Treatment for Severe Dehydration  Receiving fluid through an IV tube.   Receiving electrolyte solution through a feeding tube that is passed through your nose and into your stomach (nasogastric tube or NG tube).  Correcting any abnormalities in  electrolytes. HOME CARE INSTRUCTIONS   Drink enough fluid to keep your urine clear or pale yellow.   Drink water or fluid slowly by taking small sips. You can also try sucking on ice cubes.  Have food or beverages that contain electrolytes. Examples include bananas and sports drinks.  Take over-the-counter and prescription medicines only as told by your health care provider.   Prepare ORS according to the manufacturer's instructions. Take sips of ORS every 5 minutes until your urine returns to normal.  If you have vomiting or diarrhea, continue to try to drink water, ORS, or both.   If you have diarrhea, avoid:   Beverages that contain caffeine.   Fruit juice.   Milk.   Carbonated soft drinks.  Do not take salt tablets. This can lead to the condition of having too much sodium in your body (hypernatremia).  SEEK MEDICAL CARE IF:  You cannot eat or drink without vomiting.  You have had moderate diarrhea during a period of more than 24 hours.  You have a fever. SEEK IMMEDIATE MEDICAL CARE IF:   You have extreme thirst.  You have severe diarrhea.  You have not urinated in 6-8 hours, or you have urinated only a small amount of very dark urine.  You have shriveled skin.  You are dizzy, confused, or both.   This information is not intended to replace advice given to you by your health care provider. Make sure you discuss any questions you have with your health care provider.   Document Released: 12/08/2005 Document Revised: 08/29/2015 Document Reviewed: 04/25/2015 Elsevier Interactive Patient Education 2016 Elsevier Inc.  Low-Fat Diet for Pancreatitis or Gallbladder Conditions A low-fat diet can be helpful if you have pancreatitis or a gallbladder condition. With these conditions, your pancreas and gallbladder have trouble digesting fats. A healthy eating plan with less fat will help rest your pancreas and gallbladder and reduce your symptoms. WHAT DO I NEED TO  KNOW ABOUT THIS DIET?  Eat a low-fat diet.  Reduce your fat intake to less than 20-30% of your total daily calories. This is less than 50-60 g of fat per day.  Remember that you need some fat in your diet. Ask your dietician what your daily goal should be.  Choose nonfat and low-fat healthy foods. Look for the words "nonfat," "low fat," or "fat free."  As a guide, look on the label and choose foods with less than 3 g of fat per serving. Eat only one serving.  Avoid alcohol.  Do not smoke. If you need help quitting, talk with your health care provider.  Eat small frequent meals instead of three large heavy meals. WHAT FOODS CAN I EAT? Grains Include healthy grains and starches such as potatoes, wheat bread, fiber-rich cereal, and brown rice. Choose whole grain options whenever possible. In adults, whole grains should account for 45-65% of your daily calories.  Fruits and Vegetables Eat plenty of fruits and vegetables. Fresh fruits and vegetables add fiber to your diet. Meats and Other Protein Sources Eat lean meat such as chicken and pork. Trim any fat off of meat before cooking it. Eggs,  fish, and beans are other sources of protein. In adults, these foods should account for 10-35% of your daily calories. Dairy Choose low-fat milk and dairy options. Dairy includes fat and protein, as well as calcium.  Fats and Oils Limit high-fat foods such as fried foods, sweets, baked goods, sugary drinks.  Other Creamy sauces and condiments, such as mayonnaise, can add extra fat. Think about whether or not you need to use them, or use smaller amounts or low fat options. WHAT FOODS ARE NOT RECOMMENDED?  High fat foods, such as:  [employer]aked goods.  Ice cream.  [place] toast.  Sweet rolls.  Pizza.  Cheese bread.  Foods covered with batter, butter, creamy sauces, or cheese.  Fried foods.  Sugary drinks and desserts.  Foods that cause gas or bloating   This information is not intended to  replace advice given to you by your health care provider. Make sure you discuss any questions you have with your health care provider.   Document Released: 12/13/2013 Document Reviewed: 12/13/2013 Elsevier Interactive Patient Education [employer]2016 Elsevier Inc.

## 2017-02-06 ENCOUNTER — Emergency Department (HOSPITAL_COMMUNITY)
Admission: EM | Admit: 2017-02-06 | Discharge: 2017-02-07 | Disposition: A | Discharge status: Other Acute Inpatient Hospital | Payer: Federal, State, Local not specified - Other | Attending: Emergency Medicine | Admitting: Emergency Medicine

## 2017-02-06 ENCOUNTER — Encounter (HOSPITAL_COMMUNITY): Payer: Self-pay | Admitting: Emergency Medicine

## 2017-02-06 DIAGNOSIS — T50902A Poisoning by unspecified drugs, medicaments and biological substances, intentional self-harm, initial encounter: Secondary | ICD-10-CM

## 2017-02-06 DIAGNOSIS — Z79899 Other long term (current) drug therapy: Secondary | ICD-10-CM | POA: Insufficient documentation

## 2017-02-06 DIAGNOSIS — Z7982 Long term (current) use of aspirin: Secondary | ICD-10-CM | POA: Insufficient documentation

## 2017-02-06 DIAGNOSIS — F1994 Other psychoactive substance use, unspecified with psychoactive substance-induced mood disorder: Secondary | ICD-10-CM

## 2017-02-06 DIAGNOSIS — F1320 Sedative, hypnotic or anxiolytic dependence, uncomplicated: Secondary | ICD-10-CM | POA: Insufficient documentation

## 2017-02-06 DIAGNOSIS — F1910 Other psychoactive substance abuse, uncomplicated: Secondary | ICD-10-CM

## 2017-02-06 DIAGNOSIS — F1210 Cannabis abuse, uncomplicated: Secondary | ICD-10-CM | POA: Insufficient documentation

## 2017-02-06 DIAGNOSIS — F17210 Nicotine dependence, cigarettes, uncomplicated: Secondary | ICD-10-CM | POA: Insufficient documentation

## 2017-02-06 DIAGNOSIS — N9489 Other specified conditions associated with female genital organs and menstrual cycle: Secondary | ICD-10-CM | POA: Insufficient documentation

## 2017-02-06 DIAGNOSIS — F1020 Alcohol dependence, uncomplicated: Secondary | ICD-10-CM

## 2017-02-06 DIAGNOSIS — F1110 Opioid abuse, uncomplicated: Secondary | ICD-10-CM | POA: Insufficient documentation

## 2017-02-06 DIAGNOSIS — T424X2A Poisoning by benzodiazepines, intentional self-harm, initial encounter: Secondary | ICD-10-CM | POA: Insufficient documentation

## 2017-02-06 LAB — COMPREHENSIVE METABOLIC PANEL
ALK PHOS: 49 U/L (ref 38–126)
ALT: 22 U/L (ref 14–54)
ANION GAP: 9 (ref 5–15)
AST: 34 U/L (ref 15–41)
Albumin: 4.5 g/dL (ref 3.5–5.0)
BILIRUBIN TOTAL: 0.5 mg/dL (ref 0.3–1.2)
BUN: <5 mg/dL — ABNORMAL LOW (ref 6–20)
CALCIUM: 8.6 mg/dL — AB (ref 8.9–10.3)
CO2: 20 mmol/L — ABNORMAL LOW (ref 22–32)
Chloride: 114 mmol/L — ABNORMAL HIGH (ref 101–111)
Creatinine, Ser: 0.7 mg/dL (ref 0.44–1.00)
GFR calc Af Amer: >60 mL/min (ref >60)
GLUCOSE: 87 mg/dL (ref 65–99)
POTASSIUM: 3.9 mmol/L (ref 3.5–5.1)
Sodium: 143 mmol/L (ref 135–145)
Total Protein: 7.3 g/dL (ref 6.5–8.1)

## 2017-02-06 LAB — RAPID URINE DRUG SCREEN, HOSP PERFORMED
AMPHETAMINES: NOT DETECTED
BENZODIAZEPINES: POSITIVE — AB
Barbiturates: NOT DETECTED
Cocaine: NOT DETECTED
OPIATES: POSITIVE — AB
Tetrahydrocannabinol: POSITIVE — AB

## 2017-02-06 LAB — CBC
HCT: 34.6 % — ABNORMAL LOW (ref 36.0–46.0)
Hemoglobin: 10.8 g/dL — ABNORMAL LOW (ref 12.0–15.0)
MCH: 26.1 pg (ref 26.0–34.0)
MCHC: 31.2 g/dL (ref 30.0–36.0)
MCV: 83.6 fL (ref 78.0–100.0)
PLATELETS: 414 10*3/uL — AB (ref 150–400)
RBC: 4.14 MIL/uL (ref 3.87–5.11)
RDW: 21.1 % — ABNORMAL HIGH (ref 11.5–15.5)
WBC: 9.9 10*3/uL (ref 4.0–10.5)

## 2017-02-06 LAB — CBG MONITORING, ED: GLUCOSE-CAPILLARY: 93 mg/dL (ref 65–99)

## 2017-02-06 LAB — SALICYLATE LEVEL: Salicylate Lvl: <7 mg/dL (ref 2.8–30.0)

## 2017-02-06 LAB — ACETAMINOPHEN LEVEL: Acetaminophen (Tylenol), Serum: <10 ug/mL — ABNORMAL LOW (ref 10–30)

## 2017-02-06 LAB — HCG, QUANTITATIVE, PREGNANCY: hCG, Beta Chain, Quant, S: <1 m[IU]/mL (ref <5)

## 2017-02-06 LAB — ETHANOL: ALCOHOL ETHYL (B): 231 mg/dL — AB (ref <5)
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
## 2017-02-06 NOTE — ED Notes (Signed)
  Poison controled called and recommended  Tylenol and ETOH level again in 4 hours Watch for respiratory depression

## 2017-02-07 ENCOUNTER — Inpatient Hospital Stay (HOSPITAL_COMMUNITY)
Admission: AD | Admit: 2017-02-07 | Discharge: 2017-02-10 | DRG: 897 | Disposition: A | Discharge status: Home / Self Care | Payer: Federal, State, Local not specified - Other | Attending: Psychiatry | Admitting: Psychiatry

## 2017-02-07 ENCOUNTER — Encounter (HOSPITAL_COMMUNITY): Payer: Self-pay | Admitting: Behavioral Health

## 2017-02-07 ENCOUNTER — Encounter (HOSPITAL_COMMUNITY): Payer: Self-pay | Admitting: Mental Health

## 2017-02-07 DIAGNOSIS — Z789 Other specified health status: Secondary | ICD-10-CM | POA: Diagnosis not present

## 2017-02-07 DIAGNOSIS — F1994 Other psychoactive substance use, unspecified with psychoactive substance-induced mood disorder: Secondary | ICD-10-CM | POA: Diagnosis not present

## 2017-02-07 DIAGNOSIS — Y907 Blood alcohol level of 200-239 mg/100 ml: Secondary | ICD-10-CM | POA: Diagnosis present

## 2017-02-07 DIAGNOSIS — F1220 Cannabis dependence, uncomplicated: Secondary | ICD-10-CM | POA: Diagnosis present

## 2017-02-07 DIAGNOSIS — F17210 Nicotine dependence, cigarettes, uncomplicated: Secondary | ICD-10-CM

## 2017-02-07 DIAGNOSIS — F1199 Opioid use, unspecified with unspecified opioid-induced disorder: Secondary | ICD-10-CM | POA: Diagnosis not present

## 2017-02-07 DIAGNOSIS — F0630 Mood disorder due to known physiological condition, unspecified: Secondary | ICD-10-CM | POA: Diagnosis not present

## 2017-02-07 DIAGNOSIS — Z79899 Other long term (current) drug therapy: Secondary | ICD-10-CM | POA: Diagnosis not present

## 2017-02-07 DIAGNOSIS — Z915 Personal history of self-harm: Secondary | ICD-10-CM

## 2017-02-07 DIAGNOSIS — F1099 Alcohol use, unspecified with unspecified alcohol-induced disorder: Secondary | ICD-10-CM | POA: Diagnosis not present

## 2017-02-07 DIAGNOSIS — F1124 Opioid dependence with opioid-induced mood disorder: Secondary | ICD-10-CM | POA: Diagnosis present

## 2017-02-07 DIAGNOSIS — F332 Major depressive disorder, recurrent severe without psychotic features: Secondary | ICD-10-CM | POA: Diagnosis not present

## 2017-02-07 DIAGNOSIS — F1020 Alcohol dependence, uncomplicated: Secondary | ICD-10-CM | POA: Diagnosis present

## 2017-02-07 DIAGNOSIS — Z888 Allergy status to other drugs, medicaments and biological substances status: Secondary | ICD-10-CM | POA: Diagnosis not present

## 2017-02-07 DIAGNOSIS — F322 Major depressive disorder, single episode, severe without psychotic features: Secondary | ICD-10-CM | POA: Diagnosis present

## 2017-02-07 DIAGNOSIS — R112 Nausea with vomiting, unspecified: Secondary | ICD-10-CM

## 2017-02-07 LAB — BLOOD GAS, VENOUS
ACID-BASE DEFICIT: 5.9 mmol/L — AB (ref 0.0–2.0)
Bicarbonate: 18.3 mmol/L — ABNORMAL LOW (ref 20.0–28.0)
O2 SAT: 98.7 %
PH VEN: 7.364 (ref 7.250–7.430)
Patient temperature: 98.6
pCO2, Ven: 32.8 mmHg — ABNORMAL LOW (ref 44.0–60.0)
pO2, Ven: 152 mmHg — ABNORMAL HIGH (ref 32.0–45.0)

## 2017-02-07 LAB — I-STAT CG4 LACTIC ACID, ED: LACTIC ACID, VENOUS: 2.1 mmol/L — AB (ref 0.5–1.9)

## 2017-02-07 MED ORDER — ALUM & MAG HYDROXIDE-SIMETH 200-200-20 MG/5ML PO SUSP: 30.0000 mL | ORAL | Status: DC | PRN

## 2017-02-07 MED ORDER — SODIUM CHLORIDE 0.9 % IV BOLUS (SEPSIS)
1000.0000 mL | Freq: Once | INTRAVENOUS | Status: AC
  Administered 2017-02-07: 1000 mL via INTRAVENOUS

## 2017-02-07 MED ORDER — ZOLPIDEM TARTRATE 5 MG PO TABS
5.0000 mg | ORAL_TABLET | Freq: Every evening | ORAL | Status: DC | PRN
Start: 2017-02-07 — End: 2017-02-07

## 2017-02-07 MED ORDER — CLONIDINE HCL 0.1 MG PO TABS: 0.1000 mg | ORAL_TABLET | Freq: Every day | ORAL | Status: DC

## 2017-02-07 MED ORDER — CLONIDINE HCL 0.1 MG PO TABS
0.1000 mg | ORAL_TABLET | Freq: Four times a day (QID) | ORAL | Status: DC
  Filled 2017-02-07 (×8): qty 1

## 2017-02-07 MED ORDER — NAPROXEN 500 MG PO TABS
500.0000 mg | ORAL_TABLET | Freq: Two times a day (BID) | ORAL | Status: DC | PRN
  Administered 2017-02-08: 500 mg via ORAL
  Filled 2017-02-07: qty 1

## 2017-02-07 MED ORDER — SODIUM CHLORIDE 0.9 % IV BOLUS (SEPSIS): 1000.0000 mL | Freq: Once | INTRAVENOUS | Status: DC

## 2017-02-07 MED ORDER — ACETAMINOPHEN 325 MG PO TABS
650.0000 mg | ORAL_TABLET | Freq: Four times a day (QID) | ORAL | Status: DC | PRN
  Administered 2017-02-08: 650 mg via ORAL
  Filled 2017-02-07: qty 2

## 2017-02-07 MED ORDER — ONDANSETRON HCL 4 MG PO TABS: 4.0000 mg | ORAL_TABLET | Freq: Three times a day (TID) | ORAL | Status: DC | PRN

## 2017-02-07 MED ORDER — ONDANSETRON 4 MG PO TBDP: 4.0000 mg | ORAL_TABLET | Freq: Four times a day (QID) | ORAL | Status: DC | PRN

## 2017-02-07 MED ORDER — CLONIDINE HCL 0.1 MG PO TABS
0.1000 mg | ORAL_TABLET | ORAL | Status: DC
  Filled 2017-02-07 (×2): qty 1

## 2017-02-07 MED ORDER — LOPERAMIDE HCL 2 MG PO CAPS: 2.0000 mg | ORAL_CAPSULE | ORAL | Status: DC | PRN

## 2017-02-07 MED ORDER — HYDROXYZINE HCL 25 MG PO TABS
25.0000 mg | ORAL_TABLET | Freq: Four times a day (QID) | ORAL | Status: DC | PRN
  Administered 2017-02-07 – 2017-02-09 (×5): 25 mg via ORAL
  Filled 2017-02-07 (×5): qty 1

## 2017-02-07 MED ORDER — IBUPROFEN 200 MG PO TABS: 600.0000 mg | ORAL_TABLET | Freq: Three times a day (TID) | ORAL | Status: DC | PRN

## 2017-02-07 MED ORDER — NICOTINE 14 MG/24HR TD PT24: 14.0000 mg | MEDICATED_PATCH | Freq: Every day | TRANSDERMAL | Status: DC

## 2017-02-07 MED ORDER — DICYCLOMINE HCL 20 MG PO TABS
20.0000 mg | ORAL_TABLET | Freq: Four times a day (QID) | ORAL | Status: DC | PRN
  Administered 2017-02-09: 20 mg via ORAL
  Filled 2017-02-07: qty 1

## 2017-02-07 MED ORDER — ACETAMINOPHEN 325 MG PO TABS: 650.0000 mg | ORAL_TABLET | ORAL | Status: DC | PRN

## 2017-02-07 MED ORDER — METHOCARBAMOL 500 MG PO TABS
500.0000 mg | ORAL_TABLET | Freq: Three times a day (TID) | ORAL | Status: DC | PRN
  Administered 2017-02-09: 500 mg via ORAL
  Filled 2017-02-07: qty 1
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
## 2017-02-07 NOTE — ED Notes (Signed)
On the phone 

## 2017-02-07 NOTE — Procedures (Signed)
After analyzation of blood, RN was notified about artery blood obtained for venous blood gas.

## 2017-02-07 NOTE — ED Notes (Signed)
Pt not happy regarding having to stay for further evaluation. She requested to see EDP. Dr Jeraldine LootsLockwood in to speak with pt, unable to reason with pt due to her constant talking and profanity. Pt changed into hospital scrubs and wanded, although will have to rewanded due to mother holding personal belonging in room with pt.

## 2017-02-07 NOTE — ED Notes (Signed)
Patient sleeping and family and sitter at bed side.

## 2017-02-07 NOTE — Progress Notes (Signed)
CSW spoke with patient at bedside regarding voluntary admission and consent for treatment. Patient reported that she wanted to go home, CSW [facility]. Patient requested to contact mom and to contract for safety home. CSW informed NP of patient's request, NP reports patient unable to contract for safety and requires inpatient hospitalization. CSW informed patient and asked patient if she was willing to sign herself in, patient reported that she was going to lose her job. CSW informed patient to notify staff at [facility] that her job needs to be notified patient stated "I don't want to contact my job because this is stupid". CSW obtained patient's signature on voluntary admission and consent for treatment form. CSW thanked patient. CS[facility] signed [facility]. CSW provided patient's RN with signed form.

## 2017-02-07 NOTE — Progress Notes (Signed)
Admission note: Pt brought onto the unit to complete admission process after completing skin assessment in the search room. Writer had a difficult time collecting data from pt due to pt presenting drowsy. Pt fell asleep during admission process. Writer unable to assess pt at that time. Admission process completed by what little information writer could gather from pt and chart review.

## 2017-02-07 NOTE — Progress Notes (Signed)
Pt refused Clonidine. Pt stated that she doesn't abuse any drugs.

## 2017-02-07 NOTE — ED Notes (Signed)
Pt ambulatory w/o difficulty to room from ED

## 2017-02-07 NOTE — ED Provider Notes (Addendum)
[facility]-EMERGENCY DEPT Provider Note   CSN: [id number] Arrival date & time: 02/06/17  2100     History   Chief Complaint Chief Complaint  Patient presents with  . Drug Overdose    HPI ROSAISELA Mccann is a 31 y.o. female.  HPI Pt comes in with cc of unresponsiveness. Pt has no documented psych hx, but per mother pt is alcoholic. Pt's boyfriend reports that [name] came to home at 7 pm and informed him that she overdosed on xanax x 20 pills. Pt is not prescribed xanax.   LEVEL 5 CAVEAT FOR ALTERED MENTAL STATUS  Past Medical History:  Diagnosis Date  . Acid reflux OCCASIONAL  . CIN III (cervical intraepithelial neoplasia III)   . Endometriosis   . Frequency of urination    nocturia, hematuria, urgency   . History of alcohol abuse AS TEEN  . History of kidney stones    ureteral stone right  . History of major depression AGE 18-  SUICIDE ATTEMPT   PT STATES NO PROBLEMS SINCE  . History of suicide attempt WRIST CUTTINE   PT STATES LAST TIME AGE 18--  NO ATTEMPTS SINCE--  RECEIVED COUNSELING  . Lumbar facet arthropathy   . OA (osteoarthritis of spine) LUMBAR --  PER PT  . UTI (lower urinary tract infection)     Patient Active Problem List   Diagnosis Date Noted  . Aphthous ulcer 03/20/2016  . Elevated liver enzymes 07/02/2015  . UTI (urinary tract infection) 07/21/2014  . Cystitis 07/21/2014  . Unspecified constipation 04/01/2014  . Alcohol dependence, binge pattern (HCC) 03/30/2014  . Acute hepatitis 03/27/2014  . Hemorrhoids 04/16/2012  . Rectal bleeding 04/16/2012  . GASTRITIS 01/30/2009  . ANEMIA 08/24/2008  . Generalized anxiety disorder 08/24/2008  . GERD 08/24/2008  . ALCOHOL ABUSE, HX OF 08/24/2008  . NEPHROLITHIASIS 08/16/2008  . TOBACCO ABUSE 05/19/2008  . UTI 03/21/2008  . RENAL CALCULUS, HX OF 03/21/2008    Past Surgical History:  Procedure Laterality Date  . CERVICAL BIOPSY  W/ LOOP ELECTRODE EXCISION  FEB  2013   CIN III  (GYN OFFICE)  .  CYSTOSCOPY WITH URETEROSCOPY  04/20/2012   Procedure: CYSTOSCOPY WITH URETEROSCOPY;  Surgeon: Antony Haste, MD;  Location: [facility];  Service: Urology;  Laterality: N/A;  CYSTO, RIGHT URETEROSCOPY ,  STONE BASKET EXTRACTION   CARM LASER    . ORIF RIGHT LITTLE FINGER FX  2006  . WISDOM TOOTH EXTRACTION  2012   ORAL SURGEON OFFICE    OB History    Gravida Para Term Preterm AB Living   0 0 0 0 0 0   SAB TAB Ectopic Multiple Live Births   0 0 0 0         Home Medications    Prior to Admission medications   Medication Sig Start Date End Date Taking? Authorizing Provider  Aspirin-Acetaminophen-Caffeine (GOODY HEADACHE PO) Take 1 each by mouth daily as needed (headache).   Yes Historical Provider, MD  RaNITidine HCl (ACID REDUCER PO) Take 1 tablet by mouth daily.   Yes Historical Provider, MD  ondansetron (ZOFRAN-ODT) 8 MG disintegrating tablet Take 1 tablet (8 mg total) by mouth every 8 (eight) hours as needed for nausea or vomiting. Patient not taking: Reported on 02/06/2017 11/03/16   [name], MD    Family History Family History  Problem Relation Age of Onset  . Heart failure Father   . Cancer Mother   . Hepatitis  Autoimmune treated with prednisone    Social History Social History  Substance Use Topics  . Smoking status: Current Every Day Smoker    Packs/day: 0.50    Years: 8.00    Types: Cigarettes  . Smokeless tobacco: Never Used  . Alcohol use No     Allergies   Bactrim [sulfamethoxazole-trimethoprim]   Review of Systems Review of Systems  Unable to perform ROS: Mental status change     Physical Exam Updated Vital Signs BP (!) 81/48   Pulse 97   Resp 21   SpO2 97%   Physical Exam  Constitutional: She appears well-developed.  HENT:  Head: Normocephalic and atraumatic.  Eyes: EOM are normal.  Neck: Normal range of motion. Neck supple.  Cardiovascular: Normal rate.   Pulmonary/Chest: Effort normal.  Abdominal:  Bowel sounds are normal.  Neurological:  UNRESPONSIVE, + GAG REFLEX  Skin: Skin is warm and dry.  Nursing note and vitals reviewed.    ED Treatments / Results  Labs (all labs ordered are listed, but only abnormal results are displayed) Labs Reviewed  COMPREHENSIVE METABOLIC PANEL - Abnormal; Notable for the following:       Result Value   Chloride 114 (*)    CO2 20 (*)    BUN <5 (*)    Calcium 8.6 (*)    All other components within normal limits  ETHANOL - Abnormal; Notable for the following:    Alcohol, Ethyl (B) 231 (*)    All other components within normal limits  ACETAMINOPHEN LEVEL - Abnormal; Notable for the following:    Acetaminophen (Tylenol), Serum <10 (*)    All other components within normal limits  CBC - Abnormal; Notable for the following:    Hemoglobin 10.8 (*)    HCT 34.6 (*)    RDW 21.1 (*)    Platelets 414 (*)    All other components within normal limits  RAPID URINE DRUG SCREEN, HOSP PERFORMED - Abnormal; Notable for the following:    Opiates POSITIVE (*)    Benzodiazepines POSITIVE (*)    Tetrahydrocannabinol POSITIVE (*)    All other components within normal limits  SALICYLATE LEVEL  HCG, QUANTITATIVE, PREGNANCY  BLOOD GAS, VENOUS  CBG MONITORING, ED  I-STAT CG4 LACTIC ACID, ED    EKG  EKG Interpretation None       Radiology No results found.  Procedures Procedures (including critical care time)  Medications Ordered in ED Medications  sodium chloride 0.9 % bolus 1,000 mL (not administered)  sodium chloride 0.9 % bolus 1,000 mL (not administered)     Initial Impression / Assessment and Plan / ED Course  I have reviewed the triage vital signs and the nursing notes.  Pertinent labs & imaging results that were available during my care of the patient were reviewed by me and considered in my medical decision making (see chart for details).  Clinical Course as of Feb 07 107  Sat Feb 07, 2017  0104 Pt just woke up and was hostile.  She has now calmed down. She informs me that she overdosed to harm herself. She is still not medically cleared for psych evaluation, as she is still somnolent and hard to understand.  [AN]  0107 The patient is noted to have a MAP's <65/ SBP's <90. With the current information available to me, I don't think the patient is in septic shock. The MAP's <65/ SBP's <90, is related to an acute condition that is not due to an infection - but  likely physiologic (young, [place] woman) or could be due to dehydration. Lactate ordered. VBG ordered. Dr. Preston Fleeting to f/u.  BP: (!) 81/48 [AN]    Clinical Course User Index [AN] Derwood Kaplan, MD    Pt comes in post overdose. She has a gag reflex, but is minimally responsive. Pt is intoxicated. We will get UDS. Pt will need reassessment.  Final Clinical Impressions(s) / ED Diagnoses   Final diagnoses:  Intentional drug overdose, initial encounter (HCC)  Alcoholism (HCC)  Polysubstance abuse    New Prescriptions New Prescriptions   No medications on file     Derwood Kaplan, MD 02/07/17 0106    Derwood Kaplan, MD 02/07/17 1610
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
## 2017-02-07 NOTE — ED Notes (Signed)
Patient IVC papers filled by previous shift. Patient been cooperative  at this time. IVC papers not fax yet.

## 2017-02-07 NOTE — ED Notes (Signed)
Pt is aware that she is going to be admitted

## 2017-02-07 NOTE — ED Notes (Signed)
[name] into see
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
## 2017-02-07 NOTE — ED Notes (Signed)
Cala BradfordKimberly CSW into see

## 2017-02-07 NOTE — ED Provider Notes (Signed)
Patient initially brought in for drug overdose. She was too somnolent to have a psychiatric screening done. She has been observed in the ED and is now awake and alert. She is extremely angry and verbally abusive. She is denying suicidal intent but does state that she just wanted to escape from everything. Screening labs were unremarkable. Drug screen was positive for opiates, THC, and benzodiazepines. We'll get consultation with TTS.  Results for orders placed or performed during the hospital encounter of 02/06/17  Comprehensive metabolic panel  Result Value Ref Range   Sodium 143 135 - 145 mmol/L   Potassium 3.9 3.5 - 5.1 mmol/L   Chloride 114 (H) 101 - 111 mmol/L   CO2 20 (L) 22 - 32 mmol/L   Glucose, Bld 87 65 - 99 mg/dL   BUN <5 (L) 6 - 20 mg/dL   Creatinine, Ser 1.610.70 0.44 - 1.00 mg/dL   Calcium 8.6 (L) 8.9 - 10.3 mg/dL   Total Protein 7.3 6.5 - 8.1 g/dL   Albumin 4.5 3.5 - 5.0 g/dL   AST 34 15 - 41 U/L   ALT 22 14 - 54 U/L   Alkaline Phosphatase 49 38 - 126 U/L   Total Bilirubin 0.5 0.3 - 1.2 mg/dL   GFR calc non Af Amer >60 >60 mL/min   GFR calc Af Amer >60 >60 mL/min   Anion gap 9 5 - 15  Ethanol  Result Value Ref Range   Alcohol, Ethyl (B) 231 (H) <5 mg/dL  Salicylate level  Result Value Ref Range   Salicylate Lvl <7.0 2.8 - 30.0 mg/dL  Acetaminophen level  Result Value Ref Range   Acetaminophen (Tylenol), Serum <10 (L) 10 - 30 ug/mL  cbc  Result Value Ref Range   WBC 9.9 4.0 - 10.5 K/uL   RBC 4.14 3.87 - 5.11 MIL/uL   Hemoglobin 10.8 (L) 12.0 - 15.0 g/dL   HCT 09.634.6 (L) 04.536.0 - 40.946.0 %   MCV 83.6 78.0 - 100.0 fL   MCH 26.1 26.0 - 34.0 pg   MCHC 31.2 30.0 - 36.0 g/dL   RDW 81.121.1 (H) 91.411.5 - 78.215.5 %   Platelets 414 (H) 150 - 400 K/uL  Rapid urine drug screen (hospital performed)  Result Value Ref Range   Opiates POSITIVE (A) NONE DETECTED   Cocaine NONE DETECTED NONE DETECTED   Benzodiazepines POSITIVE (A) NONE DETECTED   Amphetamines NONE DETECTED NONE DETECTED   Tetrahydrocannabinol POSITIVE (A) NONE DETECTED   Barbiturates NONE DETECTED NONE DETECTED  hCG, quantitative, pregnancy  Result Value Ref Range   hCG, Beta Chain, Quant, S <1 <5 mIU/mL  Blood gas, venous  Result Value Ref Range   pH, Ven 7.364 7.250 - 7.430   pCO2, Ven 32.8 (L) 44.0 - 60.0 mmHg   pO2, Ven 152.0 (H) 32.0 - 45.0 mmHg   Bicarbonate 18.3 (L) 20.0 - 28.0 mmol/L   Acid-base deficit 5.9 (H) 0.0 - 2.0 mmol/L   O2 Saturation 98.7 %   Patient temperature 98.6   CBG monitoring, ED  Result Value Ref Range   Glucose-Capillary 93 65 - 99 mg/dL  I-Stat CG4 Lactic Acid, ED  Result Value Ref Range   Lactic Acid, Venous 2.10 (HH) 0.5 - 1.9 mmol/L   Comment NOTIFIED PHYSICIAN       Dione Boozeavid Clotee Schlicker, MD 02/07/17 [phone]

## 2017-02-07 NOTE — ED Notes (Addendum)
[facility]  Pt has  No belongings and will contact family to bring her clothes.

## 2017-02-07 NOTE — ED Notes (Signed)
Pt sleeping soundly, easily aroused.  Pt is aware that transport will be here shortly.  Pt tearful about being admitted, support given.

## 2017-02-07 NOTE — ED Notes (Signed)
Patient belonging cloths and jewelery are given to patient mother.

## 2017-02-07 NOTE — ED Notes (Signed)
[name] is here to transport 

## 2017-02-08 DIAGNOSIS — Z79899 Other long term (current) drug therapy: Secondary | ICD-10-CM

## 2017-02-08 DIAGNOSIS — F1994 Other psychoactive substance use, unspecified with psychoactive substance-induced mood disorder: Secondary | ICD-10-CM

## 2017-02-08 DIAGNOSIS — Z789 Other specified health status: Secondary | ICD-10-CM

## 2017-02-08 DIAGNOSIS — F17210 Nicotine dependence, cigarettes, uncomplicated: Secondary | ICD-10-CM

## 2017-02-08 DIAGNOSIS — F332 Major depressive disorder, recurrent severe without psychotic features: Secondary | ICD-10-CM

## 2017-02-08 DIAGNOSIS — F0630 Mood disorder due to known physiological condition, unspecified: Secondary | ICD-10-CM

## 2017-02-08 DIAGNOSIS — F1199 Opioid use, unspecified with unspecified opioid-induced disorder: Secondary | ICD-10-CM

## 2017-02-08 MED ORDER — HYDROXYZINE HCL 25 MG PO TABS
25.0000 mg | ORAL_TABLET | Freq: Once | ORAL | Status: AC
  Administered 2017-02-08: 25 mg via ORAL
  Filled 2017-02-08 (×2): qty 1

## 2017-02-08 NOTE — [facility] Suicide Risk Assessment (Signed)
[facility]   Nursing information obtained from:  Patient Demographic factors:  Caucasian Current Mental Status:  Suicidal ideation indicated by others, Self-harm behaviors Loss Factors:  NA Historical Factors:  Prior suicide attempts, Impulsivity Risk Reduction Factors:  Employed, Positive social support  Total Time spent with patient: 1 hour Principal Problem: <principal problem not specified> Diagnosis:   Patient Active Problem List   Diagnosis Date Noted  . Substance induced mood disorder (HCC) [F19.94] 02/07/2017  . Aphthous ulcer [K12.0] 03/20/2016  . Elevated liver enzymes [R74.8] 07/02/2015  . UTI (urinary tract infection) [N39.0] 07/21/2014  . Cystitis [N30.90] 07/21/2014  . Unspecified constipation [K59.00] 04/01/2014  . 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 Acute hepatitis [B17.9] 03/27/2014  . Hemorrhoids [455] 04/16/2012  . Rectal bleeding [K62.5] 04/16/2012  . GASTRITIS [K29.70, K29.90] 01/30/2009  . ANEMIA [D64.9] 08/24/2008  . Generalized anxiety disorder [F41.1] 08/24/2008  . GERD [K21.9] 08/24/2008  . ALCOHOL ABUSE, HX OF [F10.21] 08/24/2008  . NEPHROLITHIASIS [N20.0] 08/16/2008  . TOBACCO ABUSE [F17.200] 05/19/2008  . UTI [N39.0] 03/21/2008  . RENAL CALCULUS, HX OF [Z87.442] 03/21/2008   Subjective Data: alert oriented, sad but not hopeless.  Continued Clinical Symptoms:  Alcohol Use Disorder Identification Test Final Score (AUDIT): 2 The "Alcohol Use Disorders Identification Test", Guidelines for Use in Primary Care, Second Edition.  World Science writerHealth Organization [facility](WHO). Score between 0-7:  no or low risk or alcohol related problems. Score between 8-15:  moderate risk of alcohol related problems. Score between 16-19:  high risk of alcohol related problems. Score 20 or above:  warrants further diagnostic evaluation for alcohol dependence and treatment.   CLINICAL FACTORS:   Depression:    Hopelessness Alcohol/Substance Abuse/Dependencies Unstable or Poor Therapeutic Relationship   Musculoskeletal: Strength & Muscle Tone: within normal limits Gait & Station: normal Patient leans: no lean  Psychiatric Specialty Exam: Physical Exam  Constitutional: She appears well-developed.  HENT:  Head: Normocephalic.    Review of Systems  Cardiovascular: Negative for chest pain.  Psychiatric/Behavioral: Positive for depression and substance abuse.    Blood pressure 122/77, pulse 79, temperature 98 F (36.7 C), temperature source Oral, resp. rate 16, height 5' (1.524 m), weight 48.1 kg (106 lb).Body mass index is 20.7 kg/m.  General Appearance: Casual  Eye Contact:  Fair  Speech:  Normal Rate  Volume:  Normal  Mood:  Dysphoric  Affect:  Congruent  Thought Process:  Goal Directed  Orientation:  Full (Time, Place, and Person)  Thought Content:  Rumination  Suicidal Thoughts:  No  Homicidal Thoughts:  No  Memory:  Immediate;   Fair Recent;   Fair  Judgement:  Poor  Insight:  Shallow  Psychomotor Activity:  Normal  Concentration:  Concentration: Fair and Attention Span: Fair  Recall:  Fis[employer]Fair  Fund of Knowledge:  Fair  Language:  Fair  Akathisia:  Negative  Handed:  Right  AIMS (if indicated):     Assets:  Desire for Improvement  ADL's:  Intact  Cognition:  WNL  Sleep:  Number of Hours: 6.75      COGNITIVE FEATURES THAT CONTRIBUTE TO RISK:  Closed-mindedness    SUICIDE RISK:   Moderate:  Frequent suicidal ideation with limited intensity, and duration, some specificity in terms of plans, no associated intent, good self-control, limited dysphoria/symptomatology, some risk factors present, and identifiable protective factors, including available and accessible social support.  PLAN OF CARE: Admit for stabilization, medication management. Safety and substance abuse  I certify  that inpatient services furnished can reasonably be expected to improve the patient's  condition.   Thresa Ross, MD 02/08/2017, 10:00 AM

## 2017-02-08 NOTE — Progress Notes (Signed)
Senora's mother, Kathryne HitchGail Inlow, asked RN about getting her daughter discharged.  She stated that she feels like her daughter would be better off in another program since she knows someone on the unit.  She would like to speak with SW / MD about getting her discharged.  Her number is [phone]
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
## 2017-02-08 NOTE — Progress Notes (Signed)
D: Pt presents with flat affect. Pt denies previous suicide attempt. Pt denies abusing opiates and benzos. Pt refuses to take clonidine per protocol. Pt requesting to be discharged home and stated that she will benefit from out pt tx.  A: Orders reviewed with pt. Medications offered as ordered. Verbal support provided. Pt encouraged to attend groups. 15 minute checks performed for safety.  R: Pt compliant with attending groups.

## 2017-02-08 NOTE — Progress Notes (Signed)
Writer observed patient lying bed asleep but was easily aroused when her name was called. She was informed of her scheduled medication but refused it reporting that she was not having any withdrawal symptoms. She reported to Clinical research associatewriter that it was a mistake that she was admitted here. She did report that her step father has the flu and she took tamiflu on last Tuesday. Vitals were taken and documented in epic. She was given and encouraged to drink plenty of fluids. Writer was unable to find an documentation about this information mentioned previously. She did request medication to aid with sleep. She returned to her room after receiving a snack. Safety maintained on unit with 15 min checks.

## 2017-02-08 NOTE — Progress Notes (Signed)
Writer spoke with patient today  And she was glad to have her mother and another gentleman visit on today. She reports that she feels better and has been up in the dayroom playing cards and interacting with peers after visitation tonight. She is hopeful to discharge soon so that she can go to a different facility being that she knows someone on the [address]. Her mother feels that she needs to go to another facility also. She denies si/hi/a/v hallucinations. Support given and safety maintained on unit with 15 min checks.

## 2017-02-08 NOTE — [facility] Group Notes (Signed)
[facility] Group Notes:  (Clinical Social Work)   02/08/2017    10:00-11:00AM  Summary of Progress/Problems:   The main focus of today's process group was to   1)  discuss the importance of adding supports  2)  define health supports versus unhealthy supports  3)  identify the patient's current unhealthy supports and plan how to handle them  4)  Identify the patient's current healthy supports and plan what to add.  An emphasis was placed on using counselor, doctor, therapy groups, 12-step groups, and problem-specific support groups to expand supports.    The patient expressed that her healthy supports include her family, particularly her mother, and the unhealthy supports in her life include "people/places/things" that she tries to stay away from.  She was not able to respond to questions about how she has successfully done this or how she can imagine actually following through on that plan.  Type of Therapy:  Process Group with Motivational Interviewing  Participation Level:  Active  Participation Quality:  Attentive and Sharing  Affect:  Blunted  Cognitive:  Appropriate  Insight:  Improving  Engagement in Therapy:  Improving  Modes of Intervention:   Education, Support and Processing  Ambrose MantleMareida Grossman-Orr, LCSW 02/08/2017    1:01 PM
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
## 2017-02-08 NOTE — Progress Notes (Signed)
Pt did not attend group. 

## 2017-02-09 DIAGNOSIS — F1099 Alcohol use, unspecified with unspecified alcohol-induced disorder: Secondary | ICD-10-CM

## 2017-02-09 MED ORDER — ACAMPROSATE CALCIUM 333 MG PO TBEC
666.0000 mg | DELAYED_RELEASE_TABLET | Freq: Three times a day (TID) | ORAL | Status: DC
  Administered 2017-02-09 – 2017-02-10 (×2): 666 mg via ORAL
  Filled 2017-02-09 (×8): qty 2

## 2017-02-09 NOTE — Progress Notes (Signed)
D: Pt denies SI/HI/AVH. Pt is pleasant and cooperative. Pt stated she was feeling much better and said she has gotten some insight into her Tx being here.   A: Pt was offered support and encouragement. Pt was given scheduled medications. Pt was encourage to attend groups. Q 15 minute checks were done for safety.   R:Pt attends groups and interacts well with peers and staff. Pt is taking medication. Pt has no complaints.Pt receptive to treatment and safety maintained on unit.

## 2017-02-09 NOTE — Plan of Care (Signed)
Problem: Safety: Goal: Ability to disclose and discuss suicidal ideas will improve Outcome: Progressing Pt denied SI at this time

## 2017-02-09 NOTE — Tx Team (Signed)
Interdisciplinary Treatment and Diagnostic Plan Update  02/09/2017 Time of Session: 9:30am [name] MRN: [id number]  Principal Diagnosis: MDD, Single Episode, Severe without Psychotic Features Alcohol Use disorder, severe Cannabis Use disorder, Severe  Secondary Diagnoses: Active Problems:   Substance induced mood disorder (HCC)   Current Medications:  Current Facility-Administered Medications  Medication Dose Route Frequency Provider Last Rate Last Dose  . acetaminophen (TYLENOL) tablet 650 mg  650 mg Oral Q6H PRN Lurena Nida, NP   650 mg at 02/08/17 1656  . dicyclomine (BENTYL) tablet 20 mg  20 mg Oral Q6H PRN Lurena Nida, NP      . hydrOXYzine (ATARAX/VISTARIL) tablet 25 mg  25 mg Oral Q6H PRN Lurena Nida, NP   25 mg at 02/09/17 0826  . loperamide (IMODIUM) capsule 2-4 mg  2-4 mg Oral PRN Lurena Nida, NP      . methocarbamol (ROBAXIN) tablet 500 mg  500 mg Oral Q8H PRN Lurena Nida, NP   500 mg at 02/09/17 5681  . naproxen (NAPROSYN) tablet 500 mg  500 mg Oral BID PRN Lurena Nida, NP   500 mg at 02/08/17 2050  . ondansetron (ZOFRAN-ODT) disintegrating tablet 4 mg  4 mg Oral Q6H PRN Lurena Nida, NP        PTA Medications: Facility-Administered Medications Prior to Admission  Medication Dose Route Frequency Provider Last Rate Last Dose  . ondansetron (ZOFRAN-ODT) disintegrating tablet 8 mg  8 mg Oral Once Shawnee Knapp, MD       Prescriptions Prior to Admission  Medication Sig Dispense Refill Last Dose  . Aspirin-Acetaminophen-Caffeine (GOODY HEADACHE PO) Take 1 each by mouth daily as needed (headache).   unknown  . ondansetron (ZOFRAN-ODT) 8 MG disintegrating tablet Take 1 tablet (8 mg total) by mouth every 8 (eight) hours as needed for nausea or vomiting. (Patient not taking: Reported on 02/06/2017) 20 tablet 0 Not Taking at Unknown time  . RaNITidine HCl (ACID REDUCER PO) Take 1 tablet by mouth daily.   Past Week at Unknown time    Treatment Modalities:  Medication Management, Group therapy, Case management,  1 to 1 session with clinician, Psychoeducation, Recreational therapy.  Patient Stressors:    Patient Strengths:    Physician Treatment Plan for Primary Diagnosis: MDD, Single Episode, Severe without Psychotic Features Alcohol Use disorder, severe Cannabis Use disorder, Severe Long Term Goal(s): Improvement in symptoms so as ready for discharge  Short Term Goals: Ability to verbalize feelings will improve Ability to demonstrate self-control will improve Ability to identify and develop effective coping behaviors will improve Compliance with prescribed medications will improve Ability to verbalize feelings will improve Ability to identify and develop effective coping behaviors will improve Compliance with prescribed medications will improve  Medication Management: Evaluate patient's response, side effects, and tolerance of medication regimen.  Therapeutic Interventions: 1 to 1 sessions, Unit Group sessions and Medication administration.  Evaluation of Outcomes: Not Met  Physician Treatment Plan for Secondary Diagnosis: Active Problems:   Substance induced mood disorder ([place])   Long Term Goal(s): Improvement in symptoms so as ready for discharge  Short Term Goals: Ability to verbalize feelings will improve Ability to demonstrate self-control will improve Ability to identify and develop effective coping behaviors will improve Compliance with prescribed medications will improve Ability to verbalize feelings will improve Ability to identify and develop effective coping behaviors will improve Compliance with prescribed medications will improve  Medication Management: Evaluate patient's response, side effects, and tolerance  of medication regimen.  Therapeutic Interventions: 1 to 1 sessions, Unit Group sessions and Medication administration.  Evaluation of Outcomes: Not Met   RN Treatment Plan for Primary Diagnosis: MDD,  Single Episode, Severe without Psychotic Features Alcohol Use disorder, severe Cannabis Use disorder, Severe Long Term Goal(s): Knowledge of disease and therapeutic regimen to maintain health will improve  Short Term Goals: Ability to verbalize feelings will improve, Ability to disclose and discuss suicidal ideas and Ability to identify and develop effective coping behaviors will improve  Medication Management: RN will administer medications as ordered by provider, will assess and evaluate patient's response and provide education to patient for prescribed medication. RN will report any adverse and/or side effects to prescribing provider.  Therapeutic Interventions: 1 on 1 counseling sessions, Psychoeducation, Medication administration, Evaluate responses to treatment, Monitor vital signs and CBGs as ordered, Perform/monitor CIWA, COWS, AIMS and Fall Risk screenings as ordered, Perform wound care treatments as ordered.  Evaluation of Outcomes: Not Met   LCSW Treatment Plan for Primary Diagnosis: MDD, Single Episode, Severe without Psychotic Features Alcohol Use disorder, severe Cannabis Use disorder, Severe Long Term Goal(s): Safe transition to appropriate next level of care at discharge, Engage patient in therapeutic group addressing interpersonal concerns.  Short Term Goals: Engage patient in aftercare planning with referrals and resources, Identify triggers associated with mental health/substance abuse issues and Increase skills for wellness and recovery  Therapeutic Interventions: Assess for all discharge needs, 1 to 1 time with Social worker, Explore available resources and support systems, Assess for adequacy in community support network, Educate family and significant other(s) on suicide prevention, Complete Psychosocial Assessment, Interpersonal group therapy.  Evaluation of Outcomes: Not Met   Progress in Treatment: Attending groups: Yes Participating in groups: Yes  Taking  medication as prescribed: Yes, MD continues to assess for medication changes as needed Toleration medication: Yes, no side effects reported at this time Family/Significant other contact made: No, CSW attempting to make contact with mother Patient understands diagnosis: Continuing to assess Discussing patient identified problems/goals with staff: Yes Medical problems stabilized or resolved: Yes Denies suicidal/homicidal ideation: Yes Issues/concerns per patient self-inventory: None Other: N/A  New problem(s) identified: None identified at this time.   New Short Term/Long Term Goal(s): None identified at this time.   Discharge Plan or Barriers: Pt will return home and follow-up with outpatient services.   Reason for Continuation of Hospitalization: Anxiety Depression Medication stabilization  Estimated Length of Stay: 1 day  Attendees: Patient: 02/09/2017  12:37 PM  Physician: Dr. Parke Poisson 02/09/2017  12:37 PM  Nursing: Gaylan Gerold, RN; Grayland Ormond, RN 02/09/2017  12:37 PM  RN Care Manager: Lars Pinks, RN 02/09/2017  12:37 PM  Social Worker: Adriana Reams, LCSW; Matthew Saras, [place] 02/09/2017  12:37 PM  Recreational Therapist:  02/09/2017  12:37 PM  Other: Lindell Spar, NP 02/09/2017  12:37 PM  Other:  02/09/2017  12:37 PM  Other: 02/09/2017  12:37 PM    Scribe for Treatment Team: Gladstone Lighter, LCSW 02/09/2017 12:37 PM

## 2017-02-09 NOTE — Progress Notes (Signed)
Patient ID: [name], female   DOB: Jul 08, 1986, 31 y.o.   MRN: [id number]  DAR: Pt. Denies SI/HI and A/V Hallucinations. She reports sleep is good, appetite is good, energy level is normal, and concentration is good. Patient rates depression 0/10, hopelessness 0/10, and anxiety 2/10. Support and encouragement provided to the patient. Scheduled Clonidine refused by patient, she denies withdrawal symptoms. MD [place] notified of this. She received PRN Vistaril and PRN Robaxin this morning. Patient is minimal but cooperative. She does report that she is disappointed that she will not be discharged today. 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 Writer provided support. Patient reports her mom is a major support system for her. She is seen in the milieu intermittently but mainly keeps to herself. Q15 minute checks are maintained for safety.

## 2017-02-09 NOTE — Progress Notes (Addendum)
[facility] MD Progress Note  02/09/2017 12:52 PM [name]  MRN:  [id number] Subjective: Patient reports she is feeling partially better than on admission. At this time focused on being discharged soon. Objective : I have discussed case with treatment team and have met with patient. Patient reports she has only fragmented memory of incident . She states she had been sober for a period of time , had recently relapsed, and also reports she took some Xanax ( which she states she had been  prescribed  for anxiety but which she rarely takes ). Denies any suicidal intention, states she just wanted people at her house to leave so she could rest .  At this time does not present with withdrawal symptoms. No tremors, no diaphoresis, no acute distress/restlessness . Vitals are stable  Currently denies feeling depressed, states " honestly I feel kind of embarrassed about the whole thing". She is hoping to be discharged soon and is looking forward to return to work .  States that in the past she was on Naltrexone for help with alcohol abstinence, feels it helped and states she will discuss restarting this medication with her PCP. ( Of note, patient's admission UDS was positive for BZDs, Opiates , THC. ) We reviewed negative interactions/contraindications related to using naltrexone concomitantly with opiates . She expresses interest in Campral for help with sobriety efforts . Principal Problem: Depression, Alcohol Use Disorder  Diagnosis:   Patient Active Problem List   Diagnosis Date Noted  . Substance induced mood disorder ([place]) [F19.94] 02/07/2017  . Aphthous ulcer [K12.0] 03/20/2016  . Elevated liver enzymes [R74.8] 07/02/2015  . UTI (urinary tract infection) [N39.0] 07/21/2014  . Cystitis [N30.90] 07/21/2014  . Unspecified constipation [K59.00] 04/01/2014  . Alcohol dependence, binge pattern ([place]) [F10.20] 03/30/2014  . Acute hepatitis [B17.9] 03/27/2014  . Hemorrhoids [455] 04/16/2012  . Rectal  bleeding [K62.5] 04/16/2012  . GASTRITIS [K29.70, K29.90] 01/30/2009  . ANEMIA [D64.9] 08/24/2008  . Generalized anxiety disorder [F41.1] 08/24/2008  . GERD [K21.9] 08/24/2008  . ALCOHOL ABUSE, HX OF [F10.21] 08/24/2008  . NEPHROLITHIASIS [N20.0] 08/16/2008  . TOBACCO ABUSE [F17.200] 05/19/2008  . UTI [N39.0] 03/21/2008  . RENAL CALCULUS, HX OF [Z87.442] 03/21/2008   Total Time spent with patient: 20 minutes  Past Medical History:  Past Medical History:  Diagnosis Date  . Acid reflux OCCASIONAL  . CIN III (cervical intraepithelial neoplasia III)   . Endometriosis   . Frequency of urination    nocturia, hematuria, urgency   . History of alcohol abuse AS TEEN  . History of kidney stones    ureteral stone right  . History of major depression AGE 65-  SUICIDE ATTEMPT   PT STATES NO PROBLEMS SINCE  . History of suicide attempt WRIST CUTTINE   PT STATES LAST TIME AGE 59--  NO ATTEMPTS SINCE--  RECEIVED COUNSELING  . Lumbar facet arthropathy   . OA (osteoarthritis of spine) LUMBAR --  PER PT  . UTI (lower urinary tract infection)     Past Surgical History:  Procedure Laterality Date  . CERVICAL BIOPSY  W/ LOOP ELECTRODE EXCISION  FEB  2013   CIN III  (GYN OFFICE)  . CYSTOSCOPY WITH URETEROSCOPY  04/20/2012   Procedure: CYSTOSCOPY WITH URETEROSCOPY;  Surgeon: Fredricka Bonine, MD;  Location: [facility];  Service: Urology;  Laterality: N/A;  CYSTO, RIGHT URETEROSCOPY ,  STONE BASKET EXTRACTION   CARM LASER    . ORIF RIGHT LITTLE FINGER FX  2006  .  WISDOM TOOTH EXTRACTION  2012   ORAL SURGEON OFFICE   Family History:  Family History  Problem Relation Age of Onset  . Heart failure Father   . Cancer Mother   . Hepatitis      Autoimmune treated with prednisone   Social History:  History  Alcohol Use No     History  Drug Use No    Comment: HISTORY MARIJIUANA USE YRS AGO--  PT STATES HAS NOT USED SINCE    Social History   Social History  .  Marital status: Single    Spouse name: N/A  . Number of children: N/A  . Years of education: N/A   Occupational History  . Hair Dresser    Social History Main Topics  . Smoking status: Current Every Day Smoker    Packs/day: 0.50    Years: 8.00    Types: Cigarettes  . Smokeless tobacco: Never Used  . Alcohol use No  . Drug use: No     Comment: HISTORY MARIJIUANA USE YRS AGO--  PT STATES HAS NOT USED SINCE  . Sexual activity: Yes    Birth control/ protection: None   Other Topics Concern  . None   Social History Narrative  . None   Additional Social History:   Sleep: Good  Appetite:  Good  Current Medications: Current Facility-Administered Medications  Medication Dose Route Frequency Provider Last Rate Last Dose  . acetaminophen (TYLENOL) tablet 650 mg  650 mg Oral Q6H PRN Lurena Nida, NP   650 mg at 02/08/17 1656  . dicyclomine (BENTYL) tablet 20 mg  20 mg Oral Q6H PRN Lurena Nida, NP      . hydrOXYzine (ATARAX/VISTARIL) tablet 25 mg  25 mg Oral Q6H PRN Lurena Nida, NP   25 mg at 02/09/17 0826  . loperamide (IMODIUM) capsule 2-4 mg  2-4 mg Oral PRN Lurena Nida, NP      . methocarbamol (ROBAXIN) tablet 500 mg  500 mg Oral Q8H PRN Lurena Nida, NP   500 mg at 02/09/17 9379  . naproxen (NAPROSYN) tablet 500 mg  500 mg Oral BID PRN Lurena Nida, NP   500 mg at 02/08/17 2050  . ondansetron (ZOFRAN-ODT) disintegrating tablet 4 mg  4 mg Oral Q6H PRN Lurena Nida, NP        Lab Results: No results found for this or any previous visit (from the past 48 hour(s)).  Blood Alcohol level:  Lab Results  Component Value Date   ETH 231 (H) 02/06/2017   ETH <11 02/40/9735    Metabolic Disorder Labs: No results found for: HGBA1C, MPG No results found for: PROLACTIN No results found for: CHOL, TRIG, HDL, CHOLHDL, VLDL, LDLCALC  Physical Findings: AIMS: Facial and Oral Movements Muscles of Facial Expression: None, normal Lips and Perioral Area: None, normal Jaw:  None, normal Tongue: None, normal,Extremity Movements Upper (arms, wrists, hands, fingers): None, normal Lower (legs, knees, ankles, toes): None, normal, Trunk Movements Neck, shoulders, hips: None, normal, Overall Severity Severity of abnormal movements (highest score from questions above): None, normal Incapacitation due to abnormal movements: None, normal Patient's awareness of abnormal movements (rate only patient's report): No Awareness, Dental Status Current problems with teeth and/or dentures?: No Does patient usually wear dentures?: No  CIWA:    COWS:  COWS Total Score: 2  Musculoskeletal: Strength & Muscle Tone: within normal limits no tremors, no diaphoresis, no restlessness or psychomotor agitation Gait & Station: normal Patient leans: N/A  Psychiatric Specialty Exam: Physical Exam  ROS denies headache, no visual disturbances, no chest pain, no shortness of breath   Blood pressure 124/71, pulse 64, temperature 98.4 F (36.9 C), temperature source Oral, resp. rate 16, height 5' (1.524 m), weight 48.1 kg (106 lb).Body mass index is 20.7 kg/m.  General Appearance: Well Groomed  Eye Contact:  Good  Speech:  Normal Rate  Volume:  Normal  Mood:  reports mood improved, minimizes depression at this time, vaguely anxious   Affect:  slightly anxious, reactive affect   Thought Process:  Linear  Orientation:  Full (Time, Place, and Person)  Thought Content:  no hallucinations, no delusions, not internally preoccupied   Suicidal Thoughts:  No- denies any suicidal or self injurious ideations , contracts for safety on unit   Homicidal Thoughts:  No  Memory:  recent and remote grossly intact   Judgement:  Other:  improving   Insight:  fair- improving   Psychomotor Activity:  Normal- no restlessness, no tremors, no psychomotor agitation   Concentration:  Concentration: Good and Attention Span: Good  Recall:  Good  Fund of Knowledge:  Good  Language:  Good  Akathisia:  Negative   Handed:  Right  AIMS (if indicated):     Assets:  Communication Skills Desire for Improvement Resilience  ADL's:  Intact  Cognition:  WNL  Sleep:  Number of Hours: 6.5   Assessment - patient admitted following overdose. Admission UDS positive for BZDs, Opiates, Cannabis, and admission BAL 231. Patient has little recollection of event but denies suicidal intent, reports history of ETOH abuse , recent relapse. At this time denies SI. No current withdrawal symptoms, does not appear to be in any acute distress, vitals stable . Of note, denies any pattern of opiate abuse, denies any opiate WDL symptoms- will D/C Clonidine protocol. Interested in [place] Summary: Daily contact with patient to assess and evaluate symptoms and progress in treatment, Medication management, Plan inpatient admission and medications as below   Encourage ongoing group and milieu participation to work on coping skills and symptom reduction Encourage recovery efforts and  full abstinence from ETOH as treatment goal  D/C Clonidine detox protocol- see rationale above . Start Campral 666 mgrs TID for alcohol use disorder - side effects reviewed  We discussed starting an antidepressant medication for anxiety, mood, but not currently interested . Continue Vistaril 25 mgrs Q 6 hours PRN for anxiety Treatment team working on disposition planning options    Neita Garnet, MD 02/09/2017, 12:52 PM

## 2017-02-09 NOTE — Progress Notes (Signed)
Adult Psychoeducational Group Note  Date:  02/09/2017 Time:  1:20 PM  Group Topic/Focus:  Goals Group:   The focus of this group is to help patients establish daily goals to achieve during treatment and discuss how the patient can incorporate goal setting into their daily lives to aide in recovery.  Participation Level:  Active  Participation Quality:  Appropriate  Affect:  Appropriate  Cognitive:  Alert and Appropriate  Insight: Appropriate, Good and Improving  Engagement in Group:  Engaged  Modes of Intervention:  Discussion  Additional Comments:  Pt goal for the day is to try to stay as positive as possible, pt states that the medication is helping her and she feels much better now than when she arrived here.  [name] R [name] 02/09/2017, 1:20 PM

## 2017-02-09 NOTE — Progress Notes (Signed)
Pt attend group. Her day was a 10. She talk to doctor. A plan when she goes home and a plan once she gets home. She found her nurse today being very helpful

## 2017-02-09 NOTE — Progress Notes (Signed)
Pt attend group. Her day was 9. Pt said her day has been better.

## 2017-02-09 NOTE — Progress Notes (Signed)
Recreation Therapy Notes  Date: 02/09/17 Time: 0930 Location: 300 Hall Dayroom  Group Topic: Stress Management  Goal Area(s) Addresses:  Patient will verbalize importance of using healthy stress management.  Patient will identify positive emotions associated with healthy stress management.   Intervention: Stress Management  Activity :  Guided Imagery.  LRT introduced to thebstress management technique guided imagery.  LRT read a script that allowed patients to take a "mental vacation" from their surroundings.  Patients were to follow along as LRT read script to engage in the technique.   Education:  Stress Management, Discharge Planning.   Education Outcome: Acknowledges edcuation/In group clarification offered/Needs additional education  Clinical Observations/Feedback: Pt did not attend group.    Caroll RancherMarjette Callen Vancuren, LRT/CTRS         Caroll RancherLindsay, [place] Shaul A 02/09/2017 12:04 PM

## 2017-02-09 NOTE — [facility] Suicide Risk Assessment (Signed)
[facility] INPATIENT:  Family/Significant Other Suicide Prevention Education  Suicide Prevention Education:  Education Completed; Kathryne HitchGail Bralley, Pt's mother [phone], has been identified by the patient as the family member/significant other with whom the patient will be residing, and identified as the person(s) who will aid the patient in the event of a mental health crisis (suicidal ideations/suicide attempt).  With written consent from the patient, the family member/significant other has been provided the following suicide prevention education, prior to the and/or following the discharge of the patient.  The suicide prevention education provided includes the following:  Suicide risk factors  Suicide prevention and interventions  National Suicide Hotline telephone number  [facility] assessment telephone number  [facility] Emergency Assistance 911  [facility] and/or Residential Mobile Crisis Unit telephone number  Request made of family/significant other to:  Remove weapons (e.g., guns, rifles, knives), all items previously/currently identified as safety concern.    Remove drugs/medications (over-the-counter, prescriptions, illicit drugs), all items previously/currently identified as a safety concern.  The family member/significant other verbalizes understanding of the suicide prevention education information provided.  The family member/significant other agrees to remove the items of safety concern listed above.  Verdene LennertLauren C Lashan Macias 02/09/2017, 3:48 PM

## 2017-02-10 MED ORDER — ACAMPROSATE CALCIUM 333 MG PO TBEC: 666.0000 mg | DELAYED_RELEASE_TABLET | Freq: Three times a day (TID) | ORAL | 0 refills | Status: DC

## 2017-02-10 MED ORDER — HYDROXYZINE HCL 25 MG PO TABS: 25.0000 mg | ORAL_TABLET | Freq: Four times a day (QID) | ORAL | 0 refills | Status: DC | PRN

## 2017-02-10 NOTE — Progress Notes (Addendum)
Pt. D/C from the unit to lobby accompanied by her mother.  She was pleasant and cooperative. She voiced no SI/HI or A/V halluciations.  D/C instructions and medications reviewed with pt.  Pt. verbalized understanding of medications and d/c instructions.   All belongings (from locker # 5) returned to pt. She chose not to wait for weeks worth of samples.  Coupon printed out by SW for [facility] to get [facility].  Q 15 min checks maintained until discharge.  [name] left the unit in no apparent distress.

## 2017-02-10 NOTE — Tx Team (Signed)
Interdisciplinary Treatment and Diagnostic Plan Update  02/10/2017 Time of Session: 9:30am [name] MRN: [id number]  Principal Diagnosis: MDD, Single Episode, Severe without Psychotic Features Alcohol Use disorder, severe Cannabis Use disorder, Severe  Secondary Diagnoses: Active Problems:   Substance induced mood disorder (HCC)   Current Medications:  Current Facility-Administered Medications  Medication Dose Route Frequency Provider Last Rate Last Dose  . acamprosate (CAMPRAL) tablet 666 mg  666 mg Oral TID WC Craige Cotta, MD   666 mg at 02/10/17 0615  . acetaminophen (TYLENOL) tablet 650 mg  650 mg Oral Q6H PRN Kristeen Mans, NP   650 mg at 02/08/17 1656  . dicyclomine (BENTYL) tablet 20 mg  20 mg Oral Q6H PRN Kristeen Mans, NP   20 mg at 02/09/17 2108  . hydrOXYzine (ATARAX/VISTARIL) tablet 25 mg  25 mg Oral Q6H PRN Kristeen Mans, NP   25 mg at 02/09/17 2107  . loperamide (IMODIUM) capsule 2-4 mg  2-4 mg Oral PRN Kristeen Mans, NP      . methocarbamol (ROBAXIN) tablet 500 mg  500 mg Oral Q8H PRN Kristeen Mans, NP   500 mg at 02/09/17 5784  . naproxen (NAPROSYN) tablet 500 mg  500 mg Oral BID PRN Kristeen Mans, NP   500 mg at 02/08/17 2050  . ondansetron (ZOFRAN-ODT) disintegrating tablet 4 mg  4 mg Oral Q6H PRN Kristeen Mans, NP        PTA Medications: Facility-Administered Medications Prior to Admission  Medication Dose Route Frequency Provider Last Rate Last Dose  . ondansetron (ZOFRAN-ODT) disintegrating tablet 8 mg  8 mg Oral Once [name], MD       Prescriptions Prior to Admission  Medication Sig Dispense Refill Last Dose  . Aspirin-Acetaminophen-Caffeine (GOODY HEADACHE PO) Take 1 each by mouth daily as needed (headache).   unknown  . ondansetron (ZOFRAN-ODT) 8 MG disintegrating tablet Take 1 tablet (8 mg total) by mouth every 8 (eight) hours as needed for nausea or vomiting. (Patient not taking: Reported on 02/06/2017) 20 tablet 0 Not Taking at Unknown  time  . RaNITidine HCl (ACID REDUCER PO) Take 1 tablet by mouth daily.   Past Week at Unknown time    Treatment Modalities: Medication Management, Group therapy, Case management,  1 to 1 session with clinician, Psychoeducation, Recreational therapy.  Patient Stressors:    Patient Strengths:    Physician Treatment Plan for Primary Diagnosis: MDD, Single Episode, Severe without Psychotic Features Alcohol Use disorder, severe Cannabis Use disorder, Severe Long Term Goal(s): Improvement in symptoms so as ready for discharge  Short Term Goals: Ability to verbalize feelings will improve Ability to demonstrate self-control will improve Ability to identify and develop effective coping behaviors will improve Compliance with prescribed medications will improve Ability to verbalize feelings will improve Ability to identify and develop effective coping behaviors will improve Compliance with prescribed medications will improve  Medication Management: Evaluate patient's response, side effects, and tolerance of medication regimen.  Therapeutic Interventions: 1 to 1 sessions, Unit Group sessions and Medication administration.  Evaluation of Outcomes: Adequate for Discharge  Physician Treatment Plan for Secondary Diagnosis: Active Problems:   Substance induced mood disorder (HCC)   Long Term Goal(s): Improvement in symptoms so as ready for discharge  Short Term Goals: Ability to verbalize feelings will improve Ability to demonstrate self-control will improve Ability to identify and develop effective coping behaviors will improve Compliance with prescribed medications will improve Ability to verbalize feelings  will improve Ability to identify and develop effective coping behaviors will improve Compliance with prescribed medications will improve  Medication Management: Evaluate patient's response, side effects, and tolerance of medication regimen.  Therapeutic Interventions: 1 to 1  sessions, Unit Group sessions and Medication administration.  Evaluation of Outcomes: Adequate for Discharge   RN Treatment Plan for Primary Diagnosis: MDD, Single Episode, Severe without Psychotic Features Alcohol Use disorder, severe Cannabis Use disorder, Severe Long Term Goal(s): Knowledge of disease and therapeutic regimen to maintain health will improve  Short Term Goals: Ability to verbalize feelings will improve, Ability to disclose and discuss suicidal ideas and Ability to identify and develop effective coping behaviors will improve  Medication Management: RN will administer medications as ordered by provider, will assess and evaluate patient's response and provide education to patient for prescribed medication. RN will report any adverse and/or side effects to prescribing provider.  Therapeutic Interventions: 1 on 1 counseling sessions, Psychoeducation, Medication administration, Evaluate responses to treatment, Monitor vital signs and CBGs as ordered, Perform/monitor CIWA, COWS, AIMS and Fall Risk screenings as ordered, Perform wound care treatments as ordered.  Evaluation of Outcomes: Adequate for Discharge   LCSW Treatment Plan for Primary Diagnosis: MDD, Single Episode, Severe without Psychotic Features Alcohol Use disorder, severe Cannabis Use disorder, Severe Long Term Goal(s): Safe transition to appropriate next level of care at discharge, Engage patient in therapeutic group addressing interpersonal concerns.  Short Term Goals: Engage patient in aftercare planning with referrals and resources, Identify triggers associated with mental health/substance abuse issues and Increase skills for wellness and recovery  Therapeutic Interventions: Assess for all discharge needs, 1 to 1 time with Social worker, Explore available resources and support systems, Assess for adequacy in community support network, Educate family and significant other(s) on suicide prevention, Complete  Psychosocial Assessment, Interpersonal group therapy.  Evaluation of Outcomes: Adequate for Discharge   Progress in Treatment: Attending groups: Yes Participating in groups: Yes  Taking medication as prescribed: Yes, MD continues to assess for medication changes as needed Toleration medication: Yes, no side effects reported at this time Family/Significant other contact made:Yes with mother Patient understands diagnosis: Developing insight Discussing patient identified problems/goals with staff: Yes Medical problems stabilized or resolved: Yes Denies suicidal/homicidal ideation: Yes Issues/concerns per patient self-inventory: None Other: N/A  New problem(s) identified: None identified at this time.   New Short Term/Long Term Goal(s): None identified at this time.   Discharge Plan or Barriers: Pt will return home and follow-[facility]  Reason for Continuation of Hospitalization: None identified at this time.   Estimated Length of Stay: 0 day  Attendees: Patient: 02/10/2017  9:35 AM  Physician: Dr. Jama Flavorsobos 02/10/2017  9:35 AM  Nursing: Liborio NixonPatrice White, Charlestine MassedHeather Reddick, RN 02/10/2017  9:35 AM  RN Care Manager: Onnie BoerJennifer Clark, RN 02/10/2017  9:35 AM  Social Worker: Vernie ShanksLauren Woodward Klem, LCSW; Donnelly StagerLynn Bryant, LCSWA 02/10/2017  9:35 AM  Recreational Therapist:  02/10/2017  9:35 AM  Other: Armandina StammerAgnes Nwoko, NP; Patria ManeMay Agustin, NP 02/10/2017  9:35 AM  Other:  02/10/2017  9:35 AM  Other: 02/10/2017  9:35 AM    Scribe for Treatment Team: Verdene LennertLauren C Daylynn Stumpp, LCSW 02/10/2017 9:35 AM

## 2017-02-10 NOTE — Progress Notes (Signed)
  [facility] Management Discharge Plan :  Will you be returning to the same living situation after discharge:  Yes,  Pt returning home with family At discharge, do you have transportation home?: Yes,  Pt family to pick up Do you have the ability to pay for your medications: Yes,  Pt provided with samples and prescriptions  Release of information consent forms completed and in the chart;  Patient's signature needed at discharge.  Patient to Follow up at: Follow-up [facility] Follow up.   Specialty:  Professional Counselor Why:  Please walk-in between 8am-11am Monday-Friday to be reestablished at with this provider and to request therapy with Carola FrostBrandon Contact information: [facility] [address] [place] [address] [phone]        [facility] up on 02/18/2017.   Specialty:  Family Medicine Why:  at 2:00pm with Dr. Milinda Antisower for medication management. Please arrive at 1:45pm to check-in Contact information: [address] [place] [id number] [phone]          Next level of care provider has access to [facility] Link:no  Safety Planning and Suicide Prevention discussed: Yes,  with mother; see SPE note  Have you used any form of tobacco in the last 30 days? (Cigarettes, Smokeless Tobacco, Cigars, and/or Pipes): Yes  Has patient been referred to the Quitline?: Patient refused referral  Patient has been referred for addiction treatment: Yes  Verdene LennertLauren C Wynema Garoutte 02/10/2017, 9:41 AM

## 2017-02-10 NOTE — Progress Notes (Signed)
DAR Note: [name] has been up and visible on the unit.  She denies SI/HI or A/V hallucinations.  She does report some anxiety but explains that it is because she isn't at home.  SHe has been attending groups and interacting with peers.  She denies any pain or discomfort and appears to be in no physical distress.  She completed her self inventory and reported that her depression and hopelessness are 0/10 and her anxiety is 1/10.  She stated her goal for today is "getting home" and she will accomplish this goal by "make appointment with psychiatric doctor."  Encouraged continued participation in group and unit activities.  Q 15 minute checks maintained for safety.  We will continue to monitor the progress towards her goals.

## 2017-02-10 NOTE — [facility] Suicide Risk Assessment (Signed)
[facility]   Principal Problem: Overdose  Discharge Diagnoses:  Patient Active Problem List   Diagnosis Date Noted  . Substance induced mood disorder (HCC) [F19.94] 02/07/2017  . Aphthous ulcer [K12.0] 03/20/2016  . Elevated liver enzymes [R74.8] 07/02/2015  . UTI (urinary tract infection) [N39.0] 07/21/2014  . Cystitis [N30.90] 07/21/2014  . Unspecified constipation [K59.00] 04/01/2014  . 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 Acute hepatitis [B17.9] 03/27/2014  . Hemorrhoids [455] 04/16/2012  . Rectal bleeding [K62.5] 04/16/2012  . GASTRITIS [K29.70, K29.90] 01/30/2009  . ANEMIA [D64.9] 08/24/2008  . Generalized anxiety disorder [F41.1] 08/24/2008  . GERD [K21.9] 08/24/2008  . ALCOHOL ABUSE, HX OF [F10.21] 08/24/2008  . NEPHROLITHIASIS [N20.0] 08/16/2008  . TOBACCO ABUSE [F17.200] 05/19/2008  . UTI [N39.0] 03/21/2008  . RENAL CALCULUS, HX OF [Z87.442] 03/21/2008    Total Time spent with patient: 30 minutes  Musculoskeletal: Strength & Muscle Tone: within normal limits Gait & Station: normal Patient leans: N/A  Psychiatric Specialty Exam: ROS no  headache, no  chest pain, no shortness of breath , no vomiting   Blood pressure 117/74, pulse 86, temperature 98.6 F (37 C), resp. rate 18, height 5' (1.524 m), weight 48.1 kg (106 lb).Body mass index is 20.7 kg/m.  General Appearance: Well Groomed  Eye Contact::  Good  Speech:  Normal Rate409  Volume:  Normal  Mood:  improved, euthymic today  Affect:  Appropriate and reactive  Thought Process:  Linear and Descriptions of Associations: Intact  Orientation:  Full (Time, Place, and Person)  Thought Content:  no hallucinations, no delusions  Suicidal Thoughts:  No denies any suicidal or self injurious ideations, no homicidal or violent ideations  Homicidal Thoughts:  No  Memory:  recent and remote grossly intact   Judgement:  Good  Insight:  Good  Psychomotor Activity:   Normal  Concentration:  Good  Recall:  Good  Fund of Knowledge:Good  Language: Good  Akathisia:  Negative  Handed:  Right  AIMS (if indicated):     Assets:  Desire for Improvement Resilience  Sleep:  Number of Hours: 6.75  Cognition: WNL  ADL's:  Intact   Mental Status Per Nursing Assessment::   On Admission:  Suicidal ideation indicated by others, Self-harm behaviors  Demographic Factors:  31 year old female, employed   Loss Factors: Relapse   Historical Factors: History of alcohol abuse   Risk Reduction Factors:   Sense of responsibility to family, Employed, Positive social support and Positive coping skills or problem solving skills  Continued Clinical Symptoms:  At this time patient is much improved compared to admission- mood is euthymic, affect is full in range, no thought disorder, no suicidal or homicidal ideations, no hallucinations, no delusions, not internally preoccupied, future oriented, looking forward to return to work today or tomorrow. Denies medication side effects. Denies any cravings for alcohol at this time Behavior on unit calm, in good control   Cognitive Features That Contribute To Risk:  No gross cognitive deficits noted upon discharge. Is alert , attentive, and oriented x 3    Suicide Risk:  Mild:  Suicidal ideation of limited frequency, intensity, duration, and specificity.  There are no identifiable plans, no associated intent, mild dysphoria and related symptoms, good self-control (both objective and subjective assessment), few other risk factors, and identifiable protective factors, including available and accessible social support.  Follow-up [facility] Follow up.   Specialty:  Professional  Counselor Why:  Please walk-in between 8am-11am Monday-Friday to be reestablished at with this provider and to request therapy with Carola Frost information: [facility] of the [place] [address] [place] [address] [phone]        [facility] Follow up on 02/18/2017.   Specialty:  Family Medicine Why:  at 2:00pm with Dr. Milinda Antis for medication management. Please arrive at 1:45pm to check-in Contact information: [address] [place] [address] [phone]          Plan Of Care/Follow-up recommendations:  Activity:  as tolerated  Diet:  Regular Tests:  NA Other:  See below  Patient is leaving unit in good spirits Plans to return home Follow up as above  Full abstinence from ETOH recommended   Nehemiah Massed, MD 02/10/2017, 10:07 AM
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
## 2017-02-10 NOTE — BHH Group Notes (Signed)
[facility] Group Notes:  (Nursing/MHT/Case Management/Adjunct)  Date:  02/10/2017  Time:  930am  Type of Therapy:  Nurse Education  Participation Level:  Active  Participation Quality:  Appropriate and Attentive  Affect:  Appropriate  Cognitive:  Alert and Appropriate  Insight:  Improving  Engagement in Group:  Engaged  Modes of Intervention:  Discussion and Education  Summary of Progress/Problems:  Today's topic was Recovery.  She was active in the group discussion.    Norm ParcelHeather V Darryll Raju 02/10/2017, 10:56 AM

## 2017-02-18 ENCOUNTER — Encounter: Payer: Self-pay | Admitting: Family Medicine

## 2017-02-18 ENCOUNTER — Ambulatory Visit (INDEPENDENT_AMBULATORY_CARE_PROVIDER_SITE_OTHER): Payer: Self-pay | Admitting: Family Medicine

## 2017-02-18 VITALS — BP 112/66 | HR 65 | Temp 98.3°F | Ht 61.0 in | Wt 107.0 lb

## 2017-02-18 DIAGNOSIS — F411 Generalized anxiety disorder: Secondary | ICD-10-CM

## 2017-02-18 DIAGNOSIS — F17200 Nicotine dependence, unspecified, uncomplicated: Secondary | ICD-10-CM

## 2017-02-18 DIAGNOSIS — K219 Gastro-esophageal reflux disease without esophagitis: Secondary | ICD-10-CM

## 2017-02-18 DIAGNOSIS — J301 Allergic rhinitis due to pollen: Secondary | ICD-10-CM

## 2017-02-18 DIAGNOSIS — F1020 Alcohol dependence, uncomplicated: Secondary | ICD-10-CM

## 2017-02-18 DIAGNOSIS — F1994 Other psychoactive substance use, unspecified with psychoactive substance-induced mood disorder: Secondary | ICD-10-CM

## 2017-02-18 DIAGNOSIS — J309 Allergic rhinitis, unspecified: Secondary | ICD-10-CM | POA: Insufficient documentation

## 2017-02-18 MED ORDER — FAMOTIDINE 20 MG PO TABS: 20.0000 mg | ORAL_TABLET | Freq: Every day | ORAL | 11 refills | Status: DC

## 2017-02-18 MED ORDER — FLUTICASONE PROPIONATE 50 MCG/ACT NA SUSP: 2.0000 | Freq: Every day | NASAL | 11 refills | Status: DC

## 2017-02-18 MED ORDER — ACAMPROSATE CALCIUM 333 MG PO TBEC: 666.0000 mg | DELAYED_RELEASE_TABLET | Freq: Three times a day (TID) | ORAL | 0 refills | Status: DC

## 2017-02-18 MED ORDER — HYDROXYZINE HCL 25 MG PO TABS: 25.0000 mg | ORAL_TABLET | Freq: Four times a day (QID) | ORAL | 0 refills | Status: DC | PRN

## 2017-02-18 NOTE — Progress Notes (Signed)
Pre visit review using our clinic review tool, if applicable. No additional management support is needed unless otherwise documented below in the visit note. 

## 2017-02-18 NOTE — Patient Instructions (Addendum)
[facility]  Stop at check out for referral to psychiatry  Stay on current medications  Take care of yourself

## 2017-02-18 NOTE — Progress Notes (Signed)
Subjective:    Patient ID: [name], female    DOB: 02-May-1986, 31 y.o.   MRN: [id number]  HPI Here for hospital follow up  She was hosp 02/07/17 to 02/10/17 for presumed suicide attempt by overdose diag with substance induced mood disorder   Noted that she insisted in ED however that overdose was accidental  Took xanax Found high alcohol levels in system as well BAL was 231 UDS also pos for marijuana and opiates  She was started on clonidine protocol in the hospital  rec hydroxyzine 25 mg for anxiety   D/c on acamprosate 333 mg to take 2 tabs tid with meals for alcoholism  D/c for outpt subs abuse treatment   Wt Readings from Last 3 Encounters:  02/18/17 107 lb (48.5 kg)  02/07/17 106 lb (48.1 kg)  11/03/16 87 lb 6.4 oz (39.6 kg)   BP Readings from Last 3 Encounters:  02/18/17 112/66  02/10/17 117/74  02/07/17 102/61   Results for orders placed or performed during the hospital encounter of 02/06/17  Comprehensive metabolic panel  Result Value Ref Range   Sodium 143 135 - 145 mmol/L   Potassium 3.9 3.5 - 5.1 mmol/L   Chloride 114 (H) 101 - 111 mmol/L   CO2 20 (L) 22 - 32 mmol/L   Glucose, Bld 87 65 - 99 mg/dL   BUN <5 (L) 6 - 20 mg/dL   Creatinine, Ser 8.840.70 0.44 - 1.00 mg/dL   Calcium 8.6 (L) 8.9 - 10.3 mg/dL   Total Protein 7.3 6.5 - 8.1 g/dL   Albumin 4.5 3.5 - 5.0 g/dL   AST 34 15 - 41 U/L   ALT 22 14 - 54 U/L   Alkaline Phosphatase 49 38 - 126 U/L   Total Bilirubin 0.5 0.3 - 1.2 mg/dL   GFR calc non Af Amer >60 >60 mL/min   GFR calc Af Amer >60 >60 mL/min   Anion gap 9 5 - 15  Ethanol  Result Value Ref Range   Alcohol, Ethyl (B) 231 (H) <5 mg/dL  Salicylate level  Result Value Ref Range   Salicylate Lvl <7.0 2.8 - 30.0 mg/dL  Acetaminophen level  Result Value Ref Range   Acetaminophen (Tylenol), Serum <10 (L) 10 - 30 ug/mL  cbc  Result Value Ref Range   WBC 9.9 4.0 - 10.5 K/uL   RBC 4.14 3.87 - 5.11 MIL/uL   Hemoglobin 10.8 (L) 12.0 -  15.0 g/dL   HCT 16.634.6 (L) 06.336.0 - 01.646.0 %   MCV 83.6 78.0 - 100.0 fL   MCH 26.1 26.0 - 34.0 pg   MCHC 31.2 30.0 - 36.0 g/dL   RDW 01.021.1 (H) 93.211.5 - 35.515.5 %   Platelets 414 (H) 150 - 400 K/uL  Rapid urine drug screen (hospital performed)  Result Value Ref Range   Opiates POSITIVE (A) NONE DETECTED   Cocaine NONE DETECTED NONE DETECTED   Benzodiazepines POSITIVE (A) NONE DETECTED   Amphetamines NONE DETECTED NONE DETECTED   Tetrahydrocannabinol POSITIVE (A) NONE DETECTED   Barbiturates NONE DETECTED NONE DETECTED  hCG, quantitative, pregnancy  Result Value Ref Range   hCG, Beta Chain, Quant, S <1 <5 mIU/mL  Blood gas, venous  Result Value Ref Range   pH, Ven 7.364 7.250 - 7.430   pCO2, Ven 32.8 (L) 44.0 - 60.0 mmHg   pO2, Ven 152.0 (H) 32.0 - 45.0 mmHg   Bicarbonate 18.3 (L) 20.0 - 28.0 mmol/L   Acid-base deficit 5.9 (H) 0.0 -  2.0 mmol/L   O2 Saturation 98.7 %   Patient temperature 98.6   CBG monitoring, ED  Result Value Ref Range   Glucose-Capillary 93 65 - 99 mg/dL  I-Stat CG4 Lactic Acid, ED  Result Value Ref Range   Lactic Acid, Venous 2.10 (HH) 0.5 - 1.9 mmol/L   Comment NOTIFIED PHYSICIAN     Feeling better since she got home   Has not started counseling yet- she does not have an appt until April  She is [facility] to [facility] [place] [facility] - but does not have a car  (used to see Apolinar Junes)- will go next week   Is back to work   Campral is working well for her  Taking hydroxyzine - avg 2 pills per day (sometimes once)   Buspirone did not work   No benzodiazepines  Is using marijuana  - once per week  Recreational - and also helps mood / helps anxiety - also her chronic joint pain in hands and back  Thinks it helps her not use alcohol  Appetite is good  Sleep is not so good/ improving (has never been good)   In the past was drinking to treat her anxiety symptoms   Stress: - is improved right now / trying to find better ways to deal with it  She tends to  hold her stress in  She is writing I a journal  Calls supportive people  Gets out of the house  She does exercise 2-4 days per week - inside the house for the most part and walks   Is trying not to work crazy hours / helpful  Has taken control of that - making a routine for herself  Some motivation in faith   In the past 12 step program did not work for her  She thinks because she was not a daily drinker / more of binge drinkers  Not comfortable with having a sponsor  Has a lot of support regardless   Does not feel symptoms of depression currently  Just anxiety  Poor attentiveness   Patient Active Problem List   Diagnosis Date Noted  . Allergic rhinitis 02/18/2017  . Substance induced mood disorder (HCC) 02/07/2017  . Aphthous ulcer 03/20/2016  . Elevated liver enzymes 07/02/2015  . Unspecified constipation 04/01/2014  . Alcohol dependence, binge pattern (HCC) 03/30/2014  . Acute hepatitis 03/27/2014  . Hemorrhoids 04/16/2012  . GASTRITIS 01/30/2009  . ANEMIA 08/24/2008  . Generalized anxiety disorder 08/24/2008  . GERD 08/24/2008  . ALCOHOL ABUSE, HX OF 08/24/2008  . NEPHROLITHIASIS 08/16/2008  . TOBACCO ABUSE 05/19/2008  . RENAL CALCULUS, HX OF 03/21/2008   Past Medical History:  Diagnosis Date  . Acid reflux OCCASIONAL  . CIN III (cervical intraepithelial neoplasia III)   . Endometriosis   . Frequency of urination    nocturia, hematuria, urgency   . History of alcohol abuse AS TEEN  . History of kidney stones    ureteral stone right  . History of major depression AGE 41-  SUICIDE ATTEMPT   PT STATES NO PROBLEMS SINCE  . History of suicide attempt WRIST CUTTINE   PT STATES LAST TIME AGE 41--  NO ATTEMPTS SINCE--  RECEIVED COUNSELING  . Lumbar facet arthropathy   . OA (osteoarthritis of spine) LUMBAR --  PER PT  . UTI (lower urinary tract infection)    Past Surgical History:  Procedure Laterality Date  . CERVICAL BIOPSY  W/ LOOP ELECTRODE EXCISION  FEB  2013  CIN III  (GYN OFFICE)  . CYSTOSCOPY WITH URETEROSCOPY  04/20/2012   Procedure: CYSTOSCOPY WITH URETEROSCOPY;  Surgeon: Antony Haste, MD;  Location: [facility];  Service: Urology;  Laterality: N/A;  CYSTO, RIGHT URETEROSCOPY ,  STONE BASKET EXTRACTION   CARM LASER    . ORIF RIGHT LITTLE FINGER FX  2006  . WISDOM TOOTH EXTRACTION  2012   ORAL SURGEON OFFICE   Social History  Substance Use Topics  . Smoking status: Current Every Day Smoker    Packs/day: 0.50    Years: 8.00    Types: Cigarettes  . Smokeless tobacco: Never Used  . Alcohol use No   Family History  Problem Relation Age of Onset  . Heart failure Father   . Cancer Mother   . Hepatitis      Autoimmune treated with prednisone   Allergies  Allergen Reactions  . Bactrim [Sulfamethoxazole-Trimethoprim] Rash   No current outpatient prescriptions on file prior to visit.   No current facility-administered medications on file prior to visit.     Review of Systems    Review of Systems  Constitutional: Negative for fever, appetite change,  and unexpected weight change.  Eyes: Negative for pain and visual disturbance.  Respiratory: Negative for cough and shortness of breath.   Cardiovascular: Negative for cp or palpitations    Gastrointestinal: Negative for nausea, diarrhea and constipation.  Genitourinary: Negative for urgency and frequency.  Skin: Negative for pallor or rash   Neurological: Negative for weakness, light-headedness, numbness and headaches.  Hematological: Negative for adenopathy. Does not bruise/bleed easily.  Psychiatric/Behavioral: Negative for dysphoric mood. Pos for anxious mood     Objective:   Physical Exam  Constitutional: She appears well-developed and well-nourished. No distress.  Slim and well appearing   HENT:  Head: Normocephalic and atraumatic.  Mouth/Throat: Oropharynx is clear and moist.  Eyes: Conjunctivae and EOM are normal. Pupils are equal, round, and  reactive to light.  Neck: Normal range of motion. Neck supple. No JVD present. Carotid bruit is not present. No thyromegaly present.  Cardiovascular: Normal rate, regular rhythm, normal heart sounds and intact distal pulses.  Exam reveals no gallop.   Pulmonary/Chest: Effort normal and breath sounds normal. No respiratory distress. She has no wheezes. She has no rales.  No crackles  Diffusely distant bs  No wheeze  Abdominal: Soft. Bowel sounds are normal. She exhibits no distension, no abdominal bruit and no mass. There is no tenderness.  Musculoskeletal: She exhibits no edema.  Lymphadenopathy:    She has no cervical adenopathy.  Neurological: She is alert. She has normal reflexes. She displays tremor. No cranial nerve deficit. She exhibits normal muscle tone. Coordination normal.  Skin: Skin is warm and dry. No rash noted. No pallor.  Psychiatric: She has a normal mood and affect.          Assessment & Plan:   Problem List Items Addressed This Visit      Respiratory   Allergic rhinitis    Px flonase  Disc allergen avoidance        Digestive   GERD    pepcid px  Diet discussed       Relevant Medications   famotidine (PEPCID) 20 MG tablet     Other   Alcohol dependence, binge pattern (HCC)    S/p hospitalization  Doing well after detox  Enc f/u at [facility] for etoh [facility] Also ref to psychiatry  On campral and pt states  it is quite helpful Rev hospital notes/studies and labs in detail for this and overdose        Generalized anxiety disorder - Primary    Ref to psychiatry  F/u with counselor for mood and etoh abuse at [facility]  Has been on multiple ssri Now hydroxyzine for anx  Recent overdose-per pt not intentional  Will have etoh counseling and is done with detox       Relevant Orders   Ambulatory referral to Psychiatry   Substance induced mood disorder (HCC)    Ref to psychiatry made  Hydroxyzine is helping her anxiety  She  has been on multiple ssri in the past  Needs better anx control  Continues to abstain from alcohol       Relevant Orders   Ambulatory referral to Psychiatry   TOBACCO ABUSE    Disc in detail risks of smoking and possible outcomes including copd, vascular/ heart disease, cancer , respiratory and sinus infections  Pt voices understanding

## 2017-02-19 ENCOUNTER — Encounter: Payer: Self-pay | Admitting: Family Medicine

## 2017-02-19 NOTE — Assessment & Plan Note (Signed)
Ref to psychiatry  F/u with counselor for mood and etoh abuse at [facility]  Has been on multiple ssri Now hydroxyzine for anx  Recent overdose-per pt not intentional  Will have etoh counseling and is done with detox

## 2017-02-19 NOTE — Assessment & Plan Note (Signed)
Disc in detail risks of smoking and possible outcomes including copd, vascular/ heart disease, cancer , respiratory and sinus infections  Pt voices understanding  

## 2017-02-19 NOTE — Assessment & Plan Note (Signed)
S/p hospitalization  Doing well after detox  Enc f/u at [facility] for etoh [facility] Also ref to psychiatry  On campral and pt states it is quite helpful Rev hospital notes/studies and labs in detail for this and overdose

## 2017-02-19 NOTE — Assessment & Plan Note (Signed)
Px flonase  Disc allergen avoidance

## 2017-02-19 NOTE — Assessment & Plan Note (Signed)
Ref to psychiatry made  Hydroxyzine is helping her anxiety  She has been on multiple ssri in the past  Needs better anx control  Continues to abstain from alcohol

## 2017-02-19 NOTE — Assessment & Plan Note (Signed)
pepcid px  Diet discussed

## 2017-03-15 ENCOUNTER — Other Ambulatory Visit: Payer: Self-pay | Admitting: Family Medicine

## 2017-03-16 NOTE — Telephone Encounter (Signed)
Last filled on 02/18/17 #60 tabs with 0 refills, last OV was 02/18/17, please advise

## 2017-03-16 NOTE — Telephone Encounter (Signed)
Will refill electronically  

## 2017-04-06 ENCOUNTER — Ambulatory Visit: Payer: Self-pay | Admitting: Psychology

## 2017-05-02 ENCOUNTER — Other Ambulatory Visit: Payer: Self-pay | Admitting: Family Medicine

## 2017-05-04 NOTE — Telephone Encounter (Signed)
Will refill electronically  

## 2017-05-04 NOTE — Telephone Encounter (Signed)
Received refill electronically Last office visit 02/18/17 Last refills Acamprosate LR 02/18/17 #180 Hydroxyzine LR 03/16/17 #60

## 2017-05-04 NOTE — Telephone Encounter (Signed)
Pt left v/m requesting status of campral refill. Pt request cb.

## 2017-05-05 NOTE — Telephone Encounter (Signed)
Left message on voicemail for patient to call back. 

## 2017-06-14 ENCOUNTER — Other Ambulatory Visit: Payer: Self-pay | Admitting: Family Medicine

## 2017-06-15 NOTE — Telephone Encounter (Signed)
Will refill electronically  

## 2017-06-15 NOTE — Telephone Encounter (Signed)
Last OV was 02/18/17, last filled on 05/04/17 #60 tabs with 0 refills, pharmacy is requesting a 90 day supply, please advise

## 2018-01-06 ENCOUNTER — Other Ambulatory Visit: Payer: Self-pay | Admitting: Family Medicine

## 2018-01-06 NOTE — Telephone Encounter (Signed)
Pt requesting a refill on acamprosate. Last office was 02/18/17. No upcoming appointment. Last refill was on 05/04/17 for 180 tablets. Please advise.

## 2018-01-06 NOTE — Telephone Encounter (Signed)
Copied from CRM [id number]. Topic: Quick Communication - See Telephone Encounter >> Jan 06, 2018  1:49 PM Waymon AmatoBurton, Donna F wrote: CRM for notification. See Telephone encounter for:  Pt is needing a refill on acamprosate  [facility] [address] [place]   Best number for pt [phone] 01/06/18.

## 2018-01-06 NOTE — Telephone Encounter (Signed)
Ok to refill times one  Her pharmacy is [facility]-please let her know it is closing soon and see if she pref a different pharmacy   Please schedule a f/u with me in the next several months so we can touch base Hope she is doing well

## 2018-01-08 MED ORDER — ACAMPROSATE CALCIUM 333 MG PO TBEC: DELAYED_RELEASE_TABLET | ORAL | 0 refills | Status: DC

## 2018-01-08 NOTE — Telephone Encounter (Signed)
Left VM requesting pt to call the office back (CRM created) Rx filled

## 2018-01-11 ENCOUNTER — Ambulatory Visit (INDEPENDENT_AMBULATORY_CARE_PROVIDER_SITE_OTHER): Payer: Self-pay | Admitting: Family Medicine

## 2018-01-11 ENCOUNTER — Encounter: Payer: Self-pay | Admitting: Family Medicine

## 2018-01-11 ENCOUNTER — Telehealth: Payer: Self-pay

## 2018-01-11 VITALS — BP 132/78 | HR 73 | Temp 98.4°F | Wt 112.2 lb

## 2018-01-11 DIAGNOSIS — F419 Anxiety disorder, unspecified: Secondary | ICD-10-CM

## 2018-01-11 DIAGNOSIS — H1031 Unspecified acute conjunctivitis, right eye: Secondary | ICD-10-CM

## 2018-01-11 MED ORDER — CLONAZEPAM 0.5 MG PO TABS: 0.5000 mg | ORAL_TABLET | Freq: Two times a day (BID) | ORAL | 0 refills | Status: DC | PRN

## 2018-01-11 MED ORDER — ERYTHROMYCIN 5 MG/GM OP OINT: 1.0000 "application " | TOPICAL_OINTMENT | Freq: Four times a day (QID) | OPHTHALMIC | 0 refills | Status: DC

## 2018-01-11 NOTE — Patient Instructions (Signed)
Please follow up if eye not improved in 24 hours or go to ER if symptoms worsen  I have given you a prescription for small amount clonazepam, use sparingly

## 2018-01-11 NOTE — Telephone Encounter (Signed)
PLEASE NOTE: All timestamps contained within this report are represented as [place] Standard Time. CONFIDENTIALTY NOTICE: This fax transmission is intended only for the addressee. It contains information that is legally privileged, confidential or otherwise protected from use or disclosure. If you are not the intended recipient, you are strictly prohibited from reviewing, disclosing, copying using or disseminating any of this information or taking any action in reliance on or regarding this information. If you have received this fax in error, please notify us immediately by telephone so that we can arrange for its return to us. Phone: [phone], Toll-Free: [phone], Fax: [phone] Page: 1 of 2 Call Id: [id number] [facility] Night - Client TELEPHONE ADVICE RECORD [facility] Patient Name: [name] Gender: Female DOB: December 17, 1986 Age: [age] Y 4 M 22 D Return Phone Number: [phone] (Primary) Address: [place]: [place] [address] Client [facility] Site [facility] Physician [place], Idamae SchullerMarne - MD Contact Type Call Who Is Calling Patient / Member / Family / Caregiver Call Type Triage / Clinical Relationship To Patient Self Return Phone Number [phone] (Primary) Chief Complaint Alcohol Use, Abuse, and Dependence Reason for Call Symptomatic / Request for Health Information Initial Comment Caller states is out of her alcohol withdrawal Rx for 2 days, needs refills. Sx now: shaky hands and feels uneasy. Translation No Nurse Assessment Nurse: Nicanor Bakeosta, RN, Marylene LandAngela Date/Time (Eastern Time): 01/10/2018 1:59:21 PM Confirm and document reason for call. If symptomatic, describe symptoms. ---Caller has been out of her Campral (acamprosate) x2 days. She is starting to feel shaky and jittery. She was on it, then off of it and then started it back 2 wks ago. She took  the ones she had left over in a bottle. Does the patient have any new or worsening symptoms? ---Yes Will a triage be completed? ---Yes Related visit to physician within the last 2 weeks? ---No Does the PT have any chronic conditions? (i.e. diabetes, asthma, etc.) ---Yes List chronic conditions. ---anxiety, hx of alcoholism Is the patient pregnant or possibly pregnant? (Ask all females between the ages of 6912-55) ---No Is this a behavioral health or substance abuse call? ---No Guidelines Guideline Title Affirmed Question Affirmed Notes Nurse Date/Time (Eastern Time) Alcohol Abuse and Dependence Feeling very shaky (i.e., visible tremors of hands) [place], RN, Marylene Landngela 01/10/2018 2:01:12 PM Disp. Time Lamount Cohen(Eastern Time) Disposition Final User 01/10/2018 2:04:34 PM Call Completed Nicanor Bakeosta, RN, Marylene Landngela 01/10/2018 2:03:12 PM Go to ED Now Yes Nicanor Bakeosta, RN, Marylene LandAngela PLEASE NOTE: All timestamps contained within this report are represented as [place] Standard Time. CONFIDENTIALTY NOTICE: This fax transmission is intended only for the addressee. It contains information that is legally privileged, confidential or otherwise protected from use or disclosure. If you are not the intended recipient, you are strictly prohibited from reviewing, disclosing, copying using or disseminating any of this information or taking any action in reliance on or regarding this information. If you have received this fax in error, please notify us immediately by telephone so that we can arrange for its return to us. Phone: [phone], Toll-Free: [phone], Fax: [phone] Page: 2 of 2 Call Id: [id number] Caller Disagree/Comply Disagree Caller Understands Yes PreDisposition Did not know what to do Care Advice Given Per Guideline GO TO ED NOW: You need to be seen in the Emergency Department. Go to the ER at ___________ Hospital. Leave now. Drive carefully. CARE ADVICE per Alcohol Use, Abuse, and Dependence (Adult) guideline.  DRIVING:  Another adult should drive. Comments User: Unk Lightning, RN Date/Time Lamount Cohen Time): 01/10/2018 2:04:25 PM Pt states she can't afford to go to the doctor. She states she will just call the office tomorrow. She states she tried last week to get a refill but she does not answer her phone while at work. Refused ER. Referrals GO TO FACILITY REFUSED

## 2018-01-11 NOTE — Progress Notes (Signed)
Subjective:    Patient ID: [name], female    DOB: Oct 31, 1986, 32 y.o.   MRN: [id number]  HPI This is a 32 yo female who presents today with pain and drainage of right eye x 3 days. Started with red eyes 2 days ago, left eye got better, right eye with drainage and redness. Some throbbing started this morning, was crusted shut this morning. No recent fever/chills, runny nose, sore throat, ear pain or cough.  Typically uses allergy eye drops. Has not used for a couple of days.   Has had severe increased stress. No alcohol for about 6 weeks. Totaled her car, broke up with her boyfriend, a good friend died last week and her boyfriend (reconciled) was very sick and in the hospital recently. She has had increased panic attacks, difficulty working. Has an appointment with psychiatrist, not for 6 months because she missed previous appointment. Has ben on multiple things for anxiety in past with out relief- lexapro, effexor, sertraline. She has been on alprazolam for long time in past. Currently hydroxyzine not working at all for her. 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   Past Medical History:  Diagnosis Date  . Acid reflux OCCASIONAL  . CIN III (cervical intraepithelial neoplasia III)   . Endometriosis   . Frequency of urination    nocturia, hematuria, urgency   . History of alcohol abuse AS TEEN  . History of kidney stones    ureteral stone right  . History of major depression AGE 61-  SUICIDE ATTEMPT   PT STATES NO PROBLEMS SINCE  . History of suicide attempt WRIST CUTTINE   PT STATES LAST TIME AGE 61--  NO ATTEMPTS SINCE--  RECEIVED COUNSELING  . Lumbar facet arthropathy   . OA (osteoarthritis of spine) LUMBAR --  PER PT  . UTI (lower urinary tract infection)    Past Surgical History:  Procedure Laterality Date  . CERVICAL BIOPSY  W/ LOOP ELECTRODE EXCISION  FEB  2013   CIN III  (GYN OFFICE)  . CYSTOSCOPY WITH URETEROSCOPY  04/20/2012   Procedure: CYSTOSCOPY  WITH URETEROSCOPY;  Surgeon: Antony Haste, MD;  Location: [facility];  Service: Urology;  Laterality: N/A;  CYSTO, RIGHT URETEROSCOPY ,  STONE BASKET EXTRACTION   CARM LASER    . ORIF RIGHT LITTLE FINGER FX  2006  . WISDOM TOOTH EXTRACTION  2012   ORAL SURGEON OFFICE   Family History  Problem Relation Age of Onset  . Heart failure Father   . Cancer Mother   . Hepatitis Unknown        Autoimmune treated with prednisone   Social History   Tobacco Use  . Smoking status: Current Every Day Smoker    Packs/day: 0.50    Years: 8.00    Pack years: 4.00    Types: Cigarettes  . Smokeless tobacco: Never Used  Substance Use Topics  . Alcohol use: No    Alcohol/week: 0.0 oz  . Drug use: No    Comment: HISTORY MARIJIUANA USE YRS AGO--  PT STATES HAS NOT USED SINCE      Review of Systems Per HPI    Objective:   Physical Exam  Constitutional: She is oriented to person, place, and time. She appears well-developed and well-nourished. No distress.  HENT:  Head: Atraumatic.  Eyes: EOM are normal. Pupils are equal, round, and reactive to light. Right eye exhibits discharge (small amount thick, yellow-green). Left eye exhibits no discharge.  No scleral icterus.  Neck: Normal range of motion. Neck supple.  Cardiovascular: Normal rate.  Pulmonary/Chest: Effort normal.  Lymphadenopathy:    She has no cervical adenopathy.  Neurological: She is alert and oriented to person, place, and time.  Skin: Skin is warm and dry.  Psychiatric: She has a normal mood and affect. Her behavior is normal. Judgment and thought content normal.  Vitals reviewed.     BP 132/78   Pulse 73   Temp 98.4 F (36.9 C) (Oral)   Wt 112 lb 4 oz (50.9 kg)   SpO2 98%   BMI 21.21 kg/m  Wt Readings from Last 3 Encounters:  01/11/18 112 lb 4 oz (50.9 kg)  02/18/17 107 lb (48.5 kg)  11/03/16 87 lb 6.4 oz (39.6 kg)       Assessment & Plan:  1. Acute bacterial conjunctivitis of right  eye - Provided written and verbal information regarding diagnosis and treatment. - RTC/ER precautions reviewed - erythromycin ophthalmic ointment; Place 1 application into the right eye 4 (four) times daily. For 7 days  Dispense: 3.5 g; Refill: 0  2. Anxiety - agreed to given her small amount clonazepam and discussed potential for dependence and tolerance.  - clonazePAM (KLONOPIN) 0.5 MG tablet; Take 1 tablet (0.5 mg total) by mouth 2 (two) times daily as needed for anxiety.  Dispense: 10 tablet; Refill: 0  Olean Reeeborah Glenroy Crossen, FNP-BC   [facility]  01/11/2018 5:33 PM

## 2018-01-11 NOTE — Telephone Encounter (Signed)
Pt has appt to see Harlin HeysD Gessner FNP 01/11/18 at 10:45.

## 2018-03-03 ENCOUNTER — Other Ambulatory Visit: Payer: Self-pay | Admitting: Family Medicine

## 2018-03-03 NOTE — Telephone Encounter (Signed)
LOV: with Dr. Milinda Antisower 02/18/17  Dr. Gregary Cromerower  [facility]- [address]

## 2018-03-03 NOTE — Telephone Encounter (Signed)
Copied from CRM [phone]. Topic: Quick Communication - Rx Refill/Question >> Mar 03, 2018 11:47 AM Crist InfanteHarrald, Kathy J wrote: Medication: acamprosate (CAMPRAL) 333 MG tablet   Has the patient contacted their pharmacy? No / [facility] and [facility]. [facility] [address] - [place], [place] - [address] [phone] (Phone) [phone] (Fax)

## 2018-03-03 NOTE — Telephone Encounter (Signed)
Pt last saw Dr Milinda Antisower 02/18/17; no future appt scheduled. Last refilled # 180 on 01/08/18.Please advise.

## 2018-03-04 MED ORDER — ACAMPROSATE CALCIUM 333 MG PO TBEC: DELAYED_RELEASE_TABLET | ORAL | 0 refills | Status: DC

## 2018-03-04 NOTE — Telephone Encounter (Signed)
appt scheduled

## 2018-03-04 NOTE — Telephone Encounter (Signed)
I refilled it  Please schedule f/u when able  Thanks

## 2018-03-29 ENCOUNTER — Encounter: Payer: Self-pay | Admitting: Family Medicine

## 2018-03-29 ENCOUNTER — Ambulatory Visit: Payer: Self-pay | Admitting: Family Medicine

## 2018-03-29 VITALS — BP 118/68 | HR 106 | Temp 98.2°F | Ht 61.0 in | Wt 113.5 lb

## 2018-03-29 DIAGNOSIS — F1020 Alcohol dependence, uncomplicated: Secondary | ICD-10-CM

## 2018-03-29 DIAGNOSIS — F411 Generalized anxiety disorder: Secondary | ICD-10-CM

## 2018-03-29 DIAGNOSIS — F17200 Nicotine dependence, unspecified, uncomplicated: Secondary | ICD-10-CM

## 2018-03-29 DIAGNOSIS — F1994 Other psychoactive substance use, unspecified with psychoactive substance-induced mood disorder: Secondary | ICD-10-CM

## 2018-03-29 MED ORDER — VENLAFAXINE HCL ER 37.5 MG PO CP24: 37.5000 mg | ORAL_CAPSULE | Freq: Every day | ORAL | 5 refills | Status: DC

## 2018-03-29 MED ORDER — ACAMPROSATE CALCIUM 333 MG PO TBEC: DELAYED_RELEASE_TABLET | ORAL | 1 refills | Status: DC

## 2018-03-29 NOTE — Assessment & Plan Note (Signed)
Worse lately  One alcohol binge episode On acamprosate -which helps her avoid etoh but tends to cause more anx Rev hx of tx -has been on multiple ssri  Trial of effexor xr 37.5 mg  Discussed expectations of SNRI  medication including time to effectiveness and mechanism of action, also poss of side effects (early and late)- including mental fuzziness, weight or appetite change, nausea and poss of worse dep or anxiety (even suicidal thoughts)  Pt voiced understanding and will stop med and update if this occurs   Reviewed stressors/ coping techniques/symptoms/ support sources/ tx options and side effects in detail today  Recommend returning to counseling at [facility]  Had prev ref to psychiatry-cannot afford Given # for [facility] in [place] (as a walk in)-she will consider  >25 minutes spent in face to face time with patient, >50% spent in counselling or coordination of care including disc of self care efforts and need for exercise/outdoor time  Pt will call to update in 1 mo if needed (if she wants to inc dose of effexor)
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
## 2018-03-29 NOTE — Assessment & Plan Note (Signed)
Overall fewer binge episodes -last one was recent  No self harm  Refilled acamprosate which has helped her avoid etoh  Disc anxiety and coping techniques  Did not f/u with counseling or psychiatry due to lack of funds Disc return to [facility] Not interested in 12 step program

## 2018-03-29 NOTE — Assessment & Plan Note (Signed)
etoh binge is coping technique  Cannot afford counseling/psychiatry  See a/p for anx and etoh use

## 2018-03-29 NOTE — Assessment & Plan Note (Signed)
Still smoking-but cut down  Disc in detail risks of smoking and possible outcomes including copd, vascular/ heart disease, cancer , respiratory and sinus infections  Pt voices understanding  Not ready to quit yet

## 2018-03-29 NOTE — Patient Instructions (Addendum)
Check out [facility]   [phone]  google it - you can walk in M-F 9-4 to see if you can get plugged into the system (it may be more affordable)   Let's try effexor in the meantime - 37.5 mg once daily  If any intolerable side effects or if you feel worse - stop it and let us know  Return to [facility] if you can  Write in journal/ get outdoors and exercise when you can   Meditation is helpful  The app "smiling mind" - a good free appt to look into it / very helpful!  Let's see how you do  If a month goes by without psychiatry appointment- let me know and we can raise effexor dose if needed   Try your best to avoid alcohol

## 2018-09-08 ENCOUNTER — Emergency Department (HOSPITAL_COMMUNITY)
Admission: EM | Admit: 2018-09-08 | Discharge: 2018-09-08 | Disposition: A | Discharge status: Home / Self Care | Payer: Self-pay | Attending: Emergency Medicine | Admitting: Emergency Medicine

## 2018-09-08 ENCOUNTER — Other Ambulatory Visit: Payer: Self-pay

## 2018-09-08 ENCOUNTER — Encounter (HOSPITAL_COMMUNITY): Payer: Self-pay

## 2018-09-08 ENCOUNTER — Ambulatory Visit (HOSPITAL_COMMUNITY)
Admission: EM | Admit: 2018-09-08 | Discharge: 2018-09-08 | Disposition: A | Discharge status: Home / Self Care | Payer: PRIVATE HEALTH INSURANCE | Attending: Emergency Medicine | Admitting: Emergency Medicine

## 2018-09-08 ENCOUNTER — Ambulatory Visit: Payer: Self-pay | Admitting: Family Medicine

## 2018-09-08 DIAGNOSIS — Z0441 Encounter for examination and observation following alleged adult rape: Secondary | ICD-10-CM | POA: Diagnosis not present

## 2018-09-08 DIAGNOSIS — F17210 Nicotine dependence, cigarettes, uncomplicated: Secondary | ICD-10-CM | POA: Insufficient documentation

## 2018-09-08 DIAGNOSIS — Z79899 Other long term (current) drug therapy: Secondary | ICD-10-CM | POA: Insufficient documentation

## 2018-09-08 DIAGNOSIS — R0789 Other chest pain: Secondary | ICD-10-CM | POA: Insufficient documentation

## 2018-09-08 DIAGNOSIS — T7421XA Adult sexual abuse, confirmed, initial encounter: Secondary | ICD-10-CM

## 2018-09-08 LAB — COMPREHENSIVE METABOLIC PANEL
ALK PHOS: 53 U/L (ref 38–126)
ALT: 22 U/L (ref 0–44)
AST: 24 U/L (ref 15–41)
Albumin: 3.8 g/dL (ref 3.5–5.0)
Anion gap: 8 (ref 5–15)
BUN: 11 mg/dL (ref 6–20)
CALCIUM: 9 mg/dL (ref 8.9–10.3)
CO2: 27 mmol/L (ref 22–32)
Chloride: 104 mmol/L (ref 98–111)
Creatinine, Ser: 0.66 mg/dL (ref 0.44–1.00)
GLUCOSE: 99 mg/dL (ref 70–99)
Potassium: 4.2 mmol/L (ref 3.5–5.1)
Sodium: 139 mmol/L (ref 135–145)
TOTAL PROTEIN: 6.5 g/dL (ref 6.5–8.1)
Total Bilirubin: 1.1 mg/dL (ref 0.3–1.2)

## 2018-09-08 LAB — RAPID HIV SCREEN (HIV 1/2 AB+AG)
HIV 1/2 Antibodies: NONREACTIVE
HIV-1 P24 Antigen - HIV24: NONREACTIVE

## 2018-09-08 MED ORDER — LIDOCAINE HCL (PF) 1 % IJ SOLN
0.9000 mL | Freq: Once | INTRAMUSCULAR | Status: AC
  Administered 2018-09-08: 0.9 mL

## 2018-09-08 MED ORDER — PROMETHAZINE HCL 25 MG PO TABS: 25.0000 mg | ORAL_TABLET | Freq: Four times a day (QID) | ORAL | Status: DC | PRN

## 2018-09-08 MED ORDER — ULIPRISTAL ACETATE 30 MG PO TABS
30.0000 mg | ORAL_TABLET | Freq: Once | ORAL | Status: AC
  Administered 2018-09-08: 30 mg via ORAL
  Filled 2018-09-08: qty 1

## 2018-09-08 MED ORDER — IBUPROFEN 200 MG PO TABS: 400.0000 mg | ORAL_TABLET | Freq: Once | ORAL | Status: DC

## 2018-09-08 MED ORDER — AZITHROMYCIN 250 MG PO TABS
1000.0000 mg | ORAL_TABLET | Freq: Once | ORAL | Status: AC
  Administered 2018-09-08: 1000 mg via ORAL

## 2018-09-08 MED ORDER — ELVITEG-COBIC-EMTRICIT-TENOFAF 150-150-200-10 MG PREPACK
5.0000 | ORAL_TABLET | Freq: Once | ORAL | Status: AC
  Administered 2018-09-08: 5 via ORAL
  Filled 2018-09-08: qty 5

## 2018-09-08 MED ORDER — ELVITEG-COBIC-EMTRICIT-TENOFAF 150-150-200-10 MG PO TABS: 1.0000 | ORAL_TABLET | Freq: Every day | ORAL | 0 refills | Status: DC

## 2018-09-08 MED ORDER — CEFTRIAXONE SODIUM 250 MG IJ SOLR
250.0000 mg | Freq: Once | INTRAMUSCULAR | Status: AC
  Administered 2018-09-08: 250 mg via INTRAMUSCULAR

## 2018-09-08 MED ORDER — METRONIDAZOLE 500 MG PO TABS
2000.0000 mg | ORAL_TABLET | Freq: Once | ORAL | Status: AC
  Administered 2018-09-08: 2000 mg via ORAL
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
## 2018-09-08 NOTE — Discharge Instructions (Signed)
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 for anxiety or sleep -certain medicines for seizures like carbamazepine, phenobarbital, phenytoin -certain medicines for movement abnormalities as in Parkinson's disease, or for gastrointestinal problems -epinephrine -medicines for allergies or colds -muscle relaxants -narcotic medicines for pain -other medicines that prolong the QT interval (cause an abnormal heart rhythm) -tramadol -trimethobenzamide This list may not describe all possible interactions. Give your health care provider a list of all the medicines, herbs, non-prescription drugs, or dietary supplements you use. Also tell them if you smoke, drink alcohol,  or use illegal drugs. Some items may interact with your medicine. What should I watch for while using this medicine? Tell your doctor or health care professional if your symptoms do not start  to get better in 1 to 2 days. You may get drowsy or dizzy. Do not drive, use machinery, or do anything that needs mental alertness until you know how this medicine affects you. To reduce the risk of dizzy or fainting spells, do not stand or sit up quickly, especially if you are an older patient. Alcohol may increase dizziness and drowsiness. Avoid alcoholic drinks. Your mouth may get dry. Chewing sugarless gum or sucking hard candy, and drinking plenty of water may help. Contact your doctor if the problem does not go away or is severe. This medicine may cause dry eyes and blurred vision. If you wear contact lenses you may feel some discomfort. Lubricating drops may help. See your eye doctor if the problem does not go away or is severe. This medicine can make you more sensitive to the sun. Keep out of the sun. If you cannot avoid being in the sun, wear protective clothing and use sunscreen. Do not use sun lamps or tanning beds/booths. If you are diabetic, check your blood-sugar levels regularly. What side effects may I notice from receiving this medicine? Side effects that you should report to your doctor or health care professional as soon as possible: -blurred vision -irregular heartbeat, palpitations or chest pain -muscle or facial twitches -pain or difficulty passing urine -seizures -skin rash -slowed or shallow breathing -unusual bleeding or bruising -yellowing of the eyes or skin Side effects that usually do not require medical attention (report to your doctor or health care professional if they continue or are bothersome): -headache -nightmares, agitation, nervousness, excitability, not able to sleep (these are more likely in children) -stuffy nose This list may not describe all possible side effects. Call your doctor for medical advice about side effects. You may report side effects to FDA at 1-800-FDA-1088. Where should I keep my medicine? Keep out of the reach of children. Store at  room temperature, between 20 and 25 degrees C (68 and 77 degrees F). Protect from light. Throw away any unused medicine after the expiration date. NOTE: This sheet is a summary. It may not cover all possible information. If you have questions about this medicine, talk to your doctor, pharmacist, or health care provider.  2017 Elsevier/Gold Standard (2013-08-09 15:04:46)   Ceftriaxone (Injection/Shot) Also known as:  Rocephin  Ceftriaxone injection What is this medicine? CEFTRIAXONE (sef try AX one) is a cephalosporin antibiotic. It is used to treat certain kinds of bacterial infections. It will not work for colds, flu, or other viral infections. This medicine may be used for other purposes; ask your health care provider or pharmacist if you have questions. COMMON BRAND NAME(S): Rocephin What should I tell my health care provider before I take this medicine? They need to know if you have any of these conditions: -any chronic illness -bowel disease, like colitis -both kidney and liver disease -high bilirubin level in newborn patients -an unusual or allergic reaction to ceftriaxone, other cephalosporin or penicillin antibiotics, foods, dyes, or preservatives -pregnant or trying to get pregnant -breast-feeding How should I use this medicine? This medicine is injected into a muscle or infused it into a vein. It is usually given in a medical office or clinic. If you are to give this medicine you will be taught how to inject it. Follow instructions carefully.  Use your doses at regular intervals. Do not take your medicine more often than directed. Do not skip doses or stop your medicine early even if you feel better. Do not stop taking except on your doctor's advice. Talk to your pediatrician regarding the use of this medicine in children. Special care may be needed. Overdosage: If you think you have taken too much of this medicine contact a poison control center or emergency room at once. NOTE:  This medicine is only for you. Do not share this medicine with others. What if I miss a dose? If you miss a dose, take it as soon as you can. If it is almost time for your next dose, take only that dose. Do not take double or extra doses. What may interact with this medicine? Do not take this medicine with any of the following medications: -intravenous calcium This medicine may also interact with the following medications: -birth control pills This list may not describe all possible interactions. Give your health care provider a list of all the medicines, herbs, non-prescription drugs, or dietary supplements you use. Also tell them if you smoke, drink alcohol, or use illegal drugs. Some items may interact with your medicine. What should I watch for while using this medicine? Tell your doctor or health care professional if your symptoms do not improve or if they get worse. Do not treat diarrhea with over the counter products. Contact your doctor if you have diarrhea that lasts more than 2 days or if it is severe and watery. If you are being treated for a sexually transmitted disease, avoid sexual contact until you have finished your treatment. Having sex can infect your sexual partner. Calcium may bind to this medicine and cause lung or kidney problems. Avoid calcium products while taking this medicine and for 48 hours after taking the last dose of this medicine. What side effects may I notice from receiving this medicine? Side effects that you should report to your doctor or health care professional as soon as possible: -allergic reactions like skin rash, itching or hives, swelling of the face, lips, or tongue -breathing problems -fever, chills -irregular heartbeat -pain when passing urine -seizures -stomach pain, cramps -unusual bleeding, bruising -unusually weak or tired Side effects that usually do not require medical attention (report to your doctor or health care professional if they  continue or are bothersome): -diarrhea -dizzy, drowsy -headache -nausea, vomiting -pain, swelling, irritation where injected -stomach upset -sweating This list may not describe all possible side effects. Call your doctor for medical advice about side effects. You may report side effects to FDA at 1-800-FDA-1088. Where should I keep my medicine? Keep out of the reach of children. Store at room temperature below 25 degrees C (77 degrees F). Protect from light. Throw away any unused vials after the expiration date. NOTE: This sheet is a summary. It may not cover all possible information. If you have questions about this medicine, talk to your doctor, pharmacist, or health care provider.  2017 Elsevier/Gold Standard (2014-06-26 09:14:54)     Cobicistat; Elvitegravir; Emtricitabine; Tenofovir Alafenamide oral tablets   What is this medicine? COBICISTAT; ELVITEGRAVIR; EMTRICITABINE; TENOFOVIR ALAFENAMIDE (koe BIS i stat; el vye TEG ra veer; em tri SIT uh bean; te NOE fo veer) is three antiretroviral medicines and a medication booster in one tablet. It is used to treat HIV. This medicine is not a cure for HIV. It will not stop the spread of HIV to others. This medicine may be used for  other purposes; ask your health care provider or pharmacist if you have questions. COMMON BRAND NAME(S): Genvoya  What should I tell my health care provider before I take this medicine? They need to know if you have any of these conditions: -kidney disease -liver disease -an unusual or allergic reaction to cobicistat, elvitegravir, emtricitabine, tenofovir, other medicines, foods, dyes, or preservatives -pregnant or trying to get pregnant -breast-feeding  How should I use this medicine? Take this medicine by mouth with a glass of water. Follow the directions on the prescription label. Take this medicine with food. Take your medicine at regular intervals. Do not take your medicine more often than directed.  For your anti-HIV therapy to work as well as possible, take each dose exactly as prescribed. Do not skip doses or stop your medicine even if you feel better. Skipping doses may make the HIV virus resistant to this medicine and other medicines. Do not stop taking except on your doctor's advice. Talk to your pediatrician regarding the use of this medicine in children. While this drug may be prescribed for selected conditions, precautions do apply. Overdosage: If you think you have taken too much of this medicine contact a poison control center or emergency room at once. NOTE: This medicine is only for you. Do not share this medicine with others.  What if I miss a dose? If you miss a dose, take it as soon as you can. If it is almost time for your next dose, take only that dose. Do not take double or extra doses.  What may interact with this medicine? Do not take this medicine with any of the following medications: -adefovir -alfuzosin -certain medicines for seizures like carbamazepine, phenobarbital, phenytoin -cisapride -lumacaftor; ivacaftor -lurasidone -medicines for cholesterol like lovastatin, simvastatin -medicines for headaches like dihydroergotamine, ergotamine, methylergonovine -midazolam -other antiviral medicines for HIV or AIDS -pimozide -rifampin -sildenafil -St. John's wort -triazolam This medicine may also interact with the following medications: -antacids -atorvastatin -bosentan -buprenorphine; naloxone -certain antibiotics like clarithromycin, telithromycin, rifabutin, rifapentine -certain medications for anxiety or sleep like buspirone, clorazepate, diazepam, estazolam, flurazepam, zolpidem -certain medicines for blood pressure or heart disease like amlodipine, diltiazem, felodipine, metoprolol, nicardipine, nifedipine, timolol, verapamil -certain medicines for depression, anxiety, or psychiatric disturbances -certain medicines for erectile dysfunction like avanafil,  sildenafil, tadalafil, vardenafil -certain medicines for fungal infection like itraconazole, ketoconazole, voriconazole -colchicine -cyclosporine -dexamethasone -female hormones, like estrogens and progestins and birth control pills -fluticasone -medicines for infection like acyclovir, cidofovir, valacyclovir, ganciclovir, valganciclovir -medicines for irregular heart beat like amiodarone, bepridil, digoxin, disopyramide, dofetilide, flecainide, lidocaine, mexiletine, propafenone, quinidine -metformin -oxcarbazepine -phenothiazines like perphenazine, risperidone, thioridazine -salmeterol -sirolimus -tacrolimus -warfarin This list may not describe all possible interactions. Give your health care provider a list of all the medicines, herbs, non-prescription drugs, or dietary supplements you use. Also tell them if you smoke, drink alcohol, or use illegal drugs. Some items may interact with your medicine.  What should I watch for while using this medicine? Visit your doctor or health care professional for regular check ups. Discuss any new symptoms with your doctor. You will need to have important blood work done while on this medicine. HIV is spread to others through sexual or blood contact. Talk to your doctor about how to stop the spread of HIV. If you have hepatitis B, talk to your doctor if you plan to stop this medicine. The symptoms of hepatitis B may get worse if you stop this medicine. Birth control pills may not work properly while  you are taking this medicine. Talk to your doctor about using an extra method of birth control. Women who can still have children must use a reliable form of barrier contraception, like a condom.  What side effects may I notice from receiving this medicine? Side effects that you should report to your doctor or health care professional as soon as possible: -allergic reactions like skin rash, itching or hives, swelling of the face, lips, or tongue -breathing  problems -fast, irregular heartbeat -muscle pain or weakness -signs and symptoms of kidney injury like trouble passing urine or change in the amount of urine -signs and symptoms of liver injury like dark yellow or brown urine; general ill feeling or flu-like symptoms; light-colored stools; loss of appetite; right upper belly pain; unusually weak or tired; yellowing of the eyes or skin Side effects that usually do not require medical attention (report to your doctor or health care professional if they continue or are bothersome): -diarrhea -headache -nausea -tiredness This list may not describe all possible side effects. Call your doctor for medical advice about side effects. You may report side effects to FDA at 1-800-FDA-1088.  Where should I keep my medicine? Keep out of the reach of children. Store at room temperature below 30 degrees C (86 degrees F). Throw away any unused medicine after the expiration date. NOTE: This sheet is a summary. It may not cover all possible information. If you have questions about this medicine, talk to your doctor, pharmacist, or health care provider.  2018 Elsevier/Gold Standard (2016-09-22 12:54:04)
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
## 2018-09-08 NOTE — Telephone Encounter (Signed)
I am so sorry to hear that  I agree with advisement- to ED  Thanks
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
## 2018-09-08 NOTE — SANE Note (Signed)
Bonita Quinawn Johnson RN, FNE notified that patient is awaiting medical clearance.
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
## 2018-09-09 LAB — RPR: RPR Ser Ql: NONREACTIVE

## 2018-09-09 MED FILL — GENVOYA TABLET: 150-150-200 | 30 days supply | Qty: 30 | Fill #0

## 2018-09-09 NOTE — SANE Note (Signed)
Prophylaxis for sexually transmitted infections discussed with patient.  Patient opted for all prophylactic medications.  Blood work and medications ordered for patient.  Patient completed documentation for Advancing Access.  Patient information entered into [employer] for Patient Assistance Program.

## 2018-09-10 LAB — HEPATITIS B SURFACE ANTIGEN: Hepatitis B Surface Ag: NEGATIVE

## 2018-09-10 LAB — HEPATITIS C ANTIBODY

## 2019-11-12 ENCOUNTER — Observation Stay (HOSPITAL_COMMUNITY): Payer: Self-pay | Admitting: Anesthesiology

## 2019-11-12 ENCOUNTER — Encounter (HOSPITAL_COMMUNITY): Payer: Self-pay | Admitting: Emergency Medicine

## 2019-11-12 ENCOUNTER — Inpatient Hospital Stay (HOSPITAL_COMMUNITY)
Admission: EM | Admit: 2019-11-12 | Discharge: 2019-11-14 | DRG: 854 | Disposition: A | Discharge status: Home / Self Care | Payer: Self-pay | Attending: Internal Medicine | Admitting: Internal Medicine

## 2019-11-12 ENCOUNTER — Observation Stay (HOSPITAL_COMMUNITY): Payer: Self-pay

## 2019-11-12 ENCOUNTER — Encounter (HOSPITAL_COMMUNITY)
Admission: EM | Disposition: A | Discharge status: Home / Self Care | Payer: Self-pay | Source: Home / Self Care | Attending: Internal Medicine

## 2019-11-12 ENCOUNTER — Emergency Department (HOSPITAL_COMMUNITY): Payer: Self-pay

## 2019-11-12 ENCOUNTER — Other Ambulatory Visit: Payer: Self-pay

## 2019-11-12 DIAGNOSIS — Z86001 Personal history of in-situ neoplasm of cervix uteri: Secondary | ICD-10-CM

## 2019-11-12 DIAGNOSIS — M47816 Spondylosis without myelopathy or radiculopathy, lumbar region: Secondary | ICD-10-CM | POA: Diagnosis present

## 2019-11-12 DIAGNOSIS — Z885 Allergy status to narcotic agent status: Secondary | ICD-10-CM

## 2019-11-12 DIAGNOSIS — F329 Major depressive disorder, single episode, unspecified: Secondary | ICD-10-CM | POA: Diagnosis present

## 2019-11-12 DIAGNOSIS — Z20828 Contact with and (suspected) exposure to other viral communicable diseases: Secondary | ICD-10-CM | POA: Diagnosis present

## 2019-11-12 DIAGNOSIS — Z915 Personal history of self-harm: Secondary | ICD-10-CM

## 2019-11-12 DIAGNOSIS — E871 Hypo-osmolality and hyponatremia: Secondary | ICD-10-CM | POA: Diagnosis present

## 2019-11-12 DIAGNOSIS — F419 Anxiety disorder, unspecified: Secondary | ICD-10-CM | POA: Diagnosis present

## 2019-11-12 DIAGNOSIS — N139 Obstructive and reflux uropathy, unspecified: Secondary | ICD-10-CM

## 2019-11-12 DIAGNOSIS — N200 Calculus of kidney: Secondary | ICD-10-CM

## 2019-11-12 DIAGNOSIS — F17210 Nicotine dependence, cigarettes, uncomplicated: Secondary | ICD-10-CM | POA: Diagnosis present

## 2019-11-12 DIAGNOSIS — Z8249 Family history of ischemic heart disease and other diseases of the circulatory system: Secondary | ICD-10-CM

## 2019-11-12 DIAGNOSIS — N132 Hydronephrosis with renal and ureteral calculous obstruction: Secondary | ICD-10-CM | POA: Diagnosis present

## 2019-11-12 DIAGNOSIS — Z87442 Personal history of urinary calculi: Secondary | ICD-10-CM

## 2019-11-12 DIAGNOSIS — A4151 Sepsis due to Escherichia coli [E. coli]: Principal | ICD-10-CM | POA: Diagnosis present

## 2019-11-12 DIAGNOSIS — Z79899 Other long term (current) drug therapy: Secondary | ICD-10-CM

## 2019-11-12 DIAGNOSIS — K219 Gastro-esophageal reflux disease without esophagitis: Secondary | ICD-10-CM | POA: Diagnosis present

## 2019-11-12 DIAGNOSIS — Z882 Allergy status to sulfonamides status: Secondary | ICD-10-CM

## 2019-11-12 DIAGNOSIS — D72829 Elevated white blood cell count, unspecified: Secondary | ICD-10-CM

## 2019-11-12 DIAGNOSIS — N201 Calculus of ureter: Secondary | ICD-10-CM

## 2019-11-12 HISTORY — PX: CYSTOSCOPY W/ URETERAL STENT PLACEMENT: SHX1429

## 2019-11-12 LAB — CBC
HCT: 40.7 % (ref 36.0–46.0)
Hemoglobin: 13.3 g/dL (ref 12.0–15.0)
MCH: 32.4 pg (ref 26.0–34.0)
MCHC: 32.7 g/dL (ref 30.0–36.0)
MCV: 99 fL (ref 80.0–100.0)
Platelets: 339 10*3/uL (ref 150–400)
RBC: 4.11 MIL/uL (ref 3.87–5.11)
RDW: 12.8 % (ref 11.5–15.5)
WBC: 14.9 10*3/uL — ABNORMAL HIGH (ref 4.0–10.5)
nRBC: 0 % (ref 0.0–0.2)

## 2019-11-12 LAB — URINALYSIS, ROUTINE W REFLEX MICROSCOPIC
Bilirubin Urine: NEGATIVE
Glucose, UA: NEGATIVE mg/dL
Ketones, ur: 5 mg/dL — AB
Nitrite: NEGATIVE
Protein, ur: 30 mg/dL — AB
RBC / HPF: >50 RBC/hpf — ABNORMAL HIGH (ref 0–5)
Specific Gravity, Urine: 1.019 (ref 1.005–1.030)
pH: 6 (ref 5.0–8.0)

## 2019-11-12 LAB — I-STAT BETA HCG BLOOD, ED (MC, WL, AP ONLY): I-stat hCG, quantitative: <5 m[IU]/mL (ref <5)

## 2019-11-12 LAB — COMPREHENSIVE METABOLIC PANEL
ALT: 17 U/L (ref 0–44)
AST: 19 U/L (ref 15–41)
Albumin: 4.8 g/dL (ref 3.5–5.0)
Alkaline Phosphatase: 48 U/L (ref 38–126)
Anion gap: 11 (ref 5–15)
BUN: 15 mg/dL (ref 6–20)
CO2: 22 mmol/L (ref 22–32)
Calcium: 8.7 mg/dL — ABNORMAL LOW (ref 8.9–10.3)
Chloride: 97 mmol/L — ABNORMAL LOW (ref 98–111)
Creatinine, Ser: 0.82 mg/dL (ref 0.44–1.00)
GFR calc Af Amer: >60 mL/min (ref >60)
GFR calc non Af Amer: >60 mL/min (ref >60)
Glucose, Bld: 171 mg/dL — ABNORMAL HIGH (ref 70–99)
Potassium: 3.6 mmol/L (ref 3.5–5.1)
Sodium: 130 mmol/L — ABNORMAL LOW (ref 135–145)
Total Bilirubin: 0.6 mg/dL (ref 0.3–1.2)
Total Protein: 7.3 g/dL (ref 6.5–8.1)

## 2019-11-12 LAB — LIPASE, BLOOD: Lipase: 35 U/L (ref 11–51)

## 2019-11-12 LAB — MRSA PCR SCREENING: MRSA by PCR: NEGATIVE

## 2019-11-12 LAB — SARS CORONAVIRUS 2 BY RT PCR (HOSPITAL ORDER, PERFORMED IN ~~LOC~~ HOSPITAL LAB): SARS Coronavirus 2: NEGATIVE

## 2019-11-12 SURGERY — CYSTOSCOPY, WITH RETROGRADE PYELOGRAM AND URETERAL STENT INSERTION
Anesthesia: General | Site: Ureter | Laterality: Left

## 2019-11-12 MED ORDER — SODIUM CHLORIDE (PF) 0.9 % IJ SOLN
INTRAMUSCULAR | Status: AC
  Filled 2019-11-12: qty 50

## 2019-11-12 MED ORDER — FENTANYL CITRATE (PF) 100 MCG/2ML IJ SOLN
50.0000 ug | Freq: Once | INTRAMUSCULAR | Status: AC
  Administered 2019-11-12: 50 ug via INTRAVENOUS
  Filled 2019-11-12: qty 2

## 2019-11-12 MED ORDER — SODIUM CHLORIDE 0.9 % IR SOLN
Status: DC | PRN
  Administered 2019-11-12: 3000 mL

## 2019-11-12 MED ORDER — ONDANSETRON HCL 4 MG/2ML IJ SOLN
4.0000 mg | Freq: Four times a day (QID) | INTRAMUSCULAR | Status: DC | PRN
  Administered 2019-11-12: 4 mg via INTRAVENOUS
  Filled 2019-11-12: qty 2

## 2019-11-12 MED ORDER — PHENYLEPHRINE 40 MCG/ML (10ML) SYRINGE FOR IV PUSH (FOR BLOOD PRESSURE SUPPORT)
PREFILLED_SYRINGE | INTRAVENOUS | Status: DC | PRN
  Administered 2019-11-12: 160 ug via INTRAVENOUS
  Administered 2019-11-12: 120 ug via INTRAVENOUS

## 2019-11-12 MED ORDER — MORPHINE SULFATE (PF) 4 MG/ML IV SOLN
4.0000 mg | Freq: Once | INTRAVENOUS | Status: AC
  Administered 2019-11-12: 09:00:00 4 mg via INTRAVENOUS
  Filled 2019-11-12: qty 1

## 2019-11-12 MED ORDER — ONDANSETRON HCL 4 MG/2ML IJ SOLN
4.0000 mg | Freq: Once | INTRAMUSCULAR | Status: AC
  Administered 2019-11-12: 09:00:00 4 mg via INTRAVENOUS
  Filled 2019-11-12: qty 2

## 2019-11-12 MED ORDER — LIDOCAINE 2% (20 MG/ML) 5 ML SYRINGE
INTRAMUSCULAR | Status: DC | PRN
  Administered 2019-11-12: 40 mg via INTRAVENOUS

## 2019-11-12 MED ORDER — PROPOFOL 10 MG/ML IV BOLUS
INTRAVENOUS | Status: AC
  Filled 2019-11-12: qty 20

## 2019-11-12 MED ORDER — SODIUM CHLORIDE 0.9 % IV SOLN
INTRAVENOUS | Status: AC
  Filled 2019-11-12: qty 20

## 2019-11-12 MED ORDER — SODIUM CHLORIDE 0.9 % IV SOLN
INTRAVENOUS | Status: DC
  Administered 2019-11-12 – 2019-11-14 (×6): via INTRAVENOUS

## 2019-11-12 MED ORDER — IOHEXOL 300 MG/ML  SOLN
INTRAMUSCULAR | Status: DC | PRN
  Administered 2019-11-12: 50 mL

## 2019-11-12 MED ORDER — MIDAZOLAM HCL 5 MG/5ML IJ SOLN
INTRAMUSCULAR | Status: DC | PRN
  Administered 2019-11-12: 2 mg via INTRAVENOUS

## 2019-11-12 MED ORDER — SODIUM CHLORIDE 0.9 % IV BOLUS
1000.0000 mL | Freq: Once | INTRAVENOUS | Status: AC
  Administered 2019-11-12: 1000 mL via INTRAVENOUS

## 2019-11-12 MED ORDER — DEXAMETHASONE SODIUM PHOSPHATE 10 MG/ML IJ SOLN
INTRAMUSCULAR | Status: DC | PRN
  Administered 2019-11-12: 8 mg via INTRAVENOUS

## 2019-11-12 MED ORDER — MORPHINE SULFATE (PF) 4 MG/ML IV SOLN
6.0000 mg | Freq: Once | INTRAVENOUS | Status: AC
  Administered 2019-11-12: 6 mg via INTRAVENOUS
  Filled 2019-11-12: qty 2

## 2019-11-12 MED ORDER — SUCCINYLCHOLINE CHLORIDE 200 MG/10ML IV SOSY
PREFILLED_SYRINGE | INTRAVENOUS | Status: AC
  Filled 2019-11-12: qty 10

## 2019-11-12 MED ORDER — ONDANSETRON HCL 4 MG/2ML IJ SOLN
INTRAMUSCULAR | Status: DC | PRN
  Administered 2019-11-12: 4 mg via INTRAVENOUS

## 2019-11-12 MED ORDER — PHENYLEPHRINE HCL-NACL 10-0.9 MG/250ML-% IV SOLN
INTRAVENOUS | Status: DC | PRN
  Administered 2019-11-12: 80 ug/min via INTRAVENOUS

## 2019-11-12 MED ORDER — FENTANYL CITRATE (PF) 250 MCG/5ML IJ SOLN
INTRAMUSCULAR | Status: AC
  Filled 2019-11-12: qty 5

## 2019-11-12 MED ORDER — SUCCINYLCHOLINE CHLORIDE 200 MG/10ML IV SOSY
PREFILLED_SYRINGE | INTRAVENOUS | Status: DC | PRN
  Administered 2019-11-12: 100 mg via INTRAVENOUS

## 2019-11-12 MED ORDER — SODIUM CHLORIDE 0.9 % IV BOLUS
1000.0000 mL | Freq: Once | INTRAVENOUS | Status: AC
  Administered 2019-11-12: 09:00:00 1000 mL via INTRAVENOUS

## 2019-11-12 MED ORDER — ACETAMINOPHEN 10 MG/ML IV SOLN
INTRAVENOUS | Status: AC
  Filled 2019-11-12: qty 100

## 2019-11-12 MED ORDER — ROCURONIUM BROMIDE 50 MG/5ML IV SOSY
PREFILLED_SYRINGE | INTRAVENOUS | Status: DC | PRN
  Administered 2019-11-12: 10 mg via INTRAVENOUS

## 2019-11-12 MED ORDER — PANTOPRAZOLE SODIUM 40 MG PO TBEC
40.0000 mg | DELAYED_RELEASE_TABLET | Freq: Every day | ORAL | Status: DC
  Administered 2019-11-13 – 2019-11-14 (×2): 40 mg via ORAL
  Filled 2019-11-12 (×3): qty 1

## 2019-11-12 MED ORDER — DEXAMETHASONE SODIUM PHOSPHATE 10 MG/ML IJ SOLN
INTRAMUSCULAR | Status: AC
  Filled 2019-11-12: qty 1

## 2019-11-12 MED ORDER — PROPOFOL 10 MG/ML IV BOLUS
INTRAVENOUS | Status: DC | PRN
  Administered 2019-11-12: 150 mg via INTRAVENOUS

## 2019-11-12 MED ORDER — MIDAZOLAM HCL 2 MG/2ML IJ SOLN
INTRAMUSCULAR | Status: AC
  Filled 2019-11-12: qty 2

## 2019-11-12 MED ORDER — POLYETHYLENE GLYCOL 3350 17 G PO PACK: 17.0000 g | PACK | Freq: Every day | ORAL | Status: DC | PRN

## 2019-11-12 MED ORDER — ONDANSETRON HCL 4 MG PO TABS: 4.0000 mg | ORAL_TABLET | Freq: Four times a day (QID) | ORAL | Status: DC | PRN

## 2019-11-12 MED ORDER — CHLORHEXIDINE GLUCONATE CLOTH 2 % EX PADS
6.0000 | MEDICATED_PAD | Freq: Every day | CUTANEOUS | Status: DC
  Administered 2019-11-13 – 2019-11-14 (×2): 6 via TOPICAL

## 2019-11-12 MED ORDER — ALPRAZOLAM 0.25 MG PO TABS
0.2500 mg | ORAL_TABLET | Freq: Two times a day (BID) | ORAL | Status: DC | PRN
  Administered 2019-11-12: 13:00:00 0.25 mg via ORAL
  Filled 2019-11-12: qty 1

## 2019-11-12 MED ORDER — IOHEXOL 300 MG/ML  SOLN
100.0000 mL | Freq: Once | INTRAMUSCULAR | Status: AC | PRN
  Administered 2019-11-12: 100 mL via INTRAVENOUS

## 2019-11-12 MED ORDER — ACETAMINOPHEN 10 MG/ML IV SOLN
1000.0000 mg | Freq: Once | INTRAVENOUS | Status: AC
  Administered 2019-11-12: 1000 mg via INTRAVENOUS

## 2019-11-12 MED ORDER — LIDOCAINE 2% (20 MG/ML) 5 ML SYRINGE
INTRAMUSCULAR | Status: AC
  Filled 2019-11-12: qty 5

## 2019-11-12 MED ORDER — ACETAMINOPHEN 650 MG RE SUPP: 650.0000 mg | Freq: Four times a day (QID) | RECTAL | Status: DC | PRN

## 2019-11-12 MED ORDER — BISACODYL 10 MG RE SUPP: 10.0000 mg | Freq: Every day | RECTAL | Status: DC | PRN

## 2019-11-12 MED ORDER — SODIUM CHLORIDE 0.9 % IV SOLN
1.0000 g | Freq: Once | INTRAVENOUS | Status: AC
  Administered 2019-11-12: 17:00:00 1 g via INTRAVENOUS

## 2019-11-12 MED ORDER — FENTANYL CITRATE (PF) 100 MCG/2ML IJ SOLN: 25.0000 ug | INTRAMUSCULAR | Status: DC | PRN

## 2019-11-12 MED ORDER — DOCUSATE SODIUM 100 MG PO CAPS
100.0000 mg | ORAL_CAPSULE | Freq: Two times a day (BID) | ORAL | Status: DC
  Administered 2019-11-12 – 2019-11-14 (×4): 100 mg via ORAL
  Filled 2019-11-12 (×4): qty 1

## 2019-11-12 MED ORDER — SUGAMMADEX SODIUM 200 MG/2ML IV SOLN
INTRAVENOUS | Status: DC | PRN
  Administered 2019-11-12: 50 mg via INTRAVENOUS

## 2019-11-12 MED ORDER — ONDANSETRON HCL 4 MG/2ML IJ SOLN
INTRAMUSCULAR | Status: AC
  Filled 2019-11-12: qty 2

## 2019-11-12 MED ORDER — ACETAMINOPHEN 325 MG PO TABS
650.0000 mg | ORAL_TABLET | Freq: Four times a day (QID) | ORAL | Status: DC | PRN
  Administered 2019-11-12 – 2019-11-14 (×3): 650 mg via ORAL
  Filled 2019-11-12 (×3): qty 2

## 2019-11-12 MED ORDER — FENTANYL CITRATE (PF) 100 MCG/2ML IJ SOLN
INTRAMUSCULAR | Status: DC | PRN
  Administered 2019-11-12: 100 ug via INTRAVENOUS

## 2019-11-12 MED ORDER — MORPHINE SULFATE (PF) 4 MG/ML IV SOLN
4.0000 mg | INTRAVENOUS | Status: DC | PRN
  Administered 2019-11-12: 4 mg via INTRAVENOUS
  Filled 2019-11-12: qty 1

## 2019-11-12 MED ORDER — ONDANSETRON 4 MG PO TBDP
4.0000 mg | ORAL_TABLET | Freq: Once | ORAL | Status: AC | PRN
  Administered 2019-11-12: 4 mg via ORAL
  Filled 2019-11-12: qty 1

## 2019-11-12 MED ORDER — SODIUM CHLORIDE 0.9 % IV SOLN
1.0000 g | INTRAVENOUS | Status: DC
  Administered 2019-11-12: 1 g via INTRAVENOUS
  Filled 2019-11-12 (×2): qty 10

## 2019-11-12 MED ORDER — KETOROLAC TROMETHAMINE 30 MG/ML IJ SOLN
30.0000 mg | Freq: Four times a day (QID) | INTRAMUSCULAR | Status: DC | PRN
  Administered 2019-11-12 – 2019-11-14 (×6): 30 mg via INTRAVENOUS
  Filled 2019-11-12 (×7): qty 1

## 2019-11-12 SURGICAL SUPPLY — 16 items
BAG URO CATCHER STRL LF (MISCELLANEOUS) ×3 IMPLANT
BASKET ZERO TIP NITINOL 2.4FR (BASKET) IMPLANT
BSKT STON RTRVL ZERO TP 2.4FR (BASKET)
CATH INTERMIT  6FR 70CM (CATHETERS) ×2 IMPLANT
CLOTH BEACON ORANGE TIMEOUT ST (SAFETY) ×3 IMPLANT
GLOVE BIOGEL M STRL SZ7.5 (GLOVE) ×3 IMPLANT
GOWN STRL REUS W/TWL XL LVL3 (GOWN DISPOSABLE) ×3 IMPLANT
GUIDEWIRE ANG ZIPWIRE 038X150 (WIRE) IMPLANT
GUIDEWIRE STR DUAL SENSOR (WIRE) ×3 IMPLANT
KIT TURNOVER KIT A (KITS) IMPLANT
MANIFOLD NEPTUNE II (INSTRUMENTS) ×3 IMPLANT
PACK CYSTO (CUSTOM PROCEDURE TRAY) ×3 IMPLANT
STENT URET 6FRX24 CONTOUR (STENTS) ×2 IMPLANT
TRAY FOLEY MTR SLVR 16FR STAT (SET/KITS/TRAYS/PACK) ×2 IMPLANT
TUBING CONNECTING 10 (TUBING) ×2 IMPLANT
TUBING CONNECTING 10' (TUBING) ×1

## 2019-11-12 NOTE — Consult Note (Addendum)
Consultation: Left UPJ stone, sepsis Requested by: Dr. Benjiman CoreNathan Pickering  History of Present Illness: [name] is a 33 year old female with a history of stones.  She had a known left lower pole stone but did not follow-up for surveillance with me.  She developed the acute onset of left flank pain yesterday and presented to emergency.  White count was 14, UA with many bacteria and heart rate of 123.  CT scan with 9 mm left UPJ stone and hydronephrosis and therefore decision was made for urgent stenting.  Rapid Covid testing just returned negative.  She has become progressively febrile and tachycardic today.  Past Medical History:  Diagnosis Date  . Acid reflux OCCASIONAL  . CIN III (cervical intraepithelial neoplasia III)   . Endometriosis   . Frequency of urination    nocturia, hematuria, urgency   . History of alcohol abuse AS TEEN  . History of kidney stones    ureteral stone right  . History of major depression AGE 33-  SUICIDE ATTEMPT   PT STATES NO PROBLEMS SINCE  . History of suicide attempt WRIST CUTTINE   PT STATES LAST TIME AGE 33--  NO ATTEMPTS SINCE--  RECEIVED COUNSELING  . Lumbar facet arthropathy   . OA (osteoarthritis of spine) LUMBAR --  PER PT  . UTI (lower urinary tract infection)    Past Surgical History:  Procedure Laterality Date  . CERVICAL BIOPSY  W/ LOOP ELECTRODE EXCISION  FEB  2013   CIN III  (GYN OFFICE)  . CYSTOSCOPY WITH URETEROSCOPY  04/20/2012   Procedure: CYSTOSCOPY WITH URETEROSCOPY;  Surgeon: Antony HasteMatthew Ramsey Timea Breed, MD;  [facility];  Service: Urology;  Laterality: N/A;  CYSTO, RIGHT URETEROSCOPY ,  STONE BASKET EXTRACTION   CARM LASER    . ORIF RIGHT LITTLE FINGER FX  2006  . WISDOM TOOTH EXTRACTION  2012   ORAL SURGEON OFFICE    Home Medications:  (Not in a hospital admission)  Allergies:  Allergies  Allergen Reactions  . Codeine Itching  . Bactrim [Sulfamethoxazole-Trimethoprim] Hives and Rash    Family History   Problem Relation Age of Onset  . Heart failure Father   . Cancer Mother   . Hepatitis Other        Autoimmune treated with prednisone   Social History:  reports that she has been smoking cigarettes. She has a 4.00 pack-year smoking history. She has never used smokeless tobacco. She reports current alcohol use. She reports that she does not use drugs.  ROS: A complete review of systems was performed.  All systems are negative except for pertinent findings as noted. Review of Systems  Constitutional: Positive for malaise/fatigue.     Physical Exam:  Vital signs in last 24 hours: Temp:  [98.4 F (36.9 C)-102.3 F (39.1 C)] 102.3 F (39.1 C) (11/21 1249) Pulse Rate:  [93-148] 148 (11/21 1500) Resp:  [16-32] 31 (11/21 1500) BP: (112-141)/(53-88) 136/69 (11/21 1500) SpO2:  [93 %-100 %] 93 % (11/21 1500) Weight:  [61.2 kg] 61.2 kg (11/21 0259) General:  Alert and oriented, No acute distress HEENT: Normocephalic, atraumatic Cardiovascular: Regular rate and rhythm - tachy in 140's Lungs: Regular rate and effort Abdomen: Soft, nontender, nondistended, no abdominal masses Back: No CVA tenderness Extremities: No edema Neurologic: Grossly intact  Laboratory Data:  Results for orders placed or performed during the hospital encounter of 11/12/19 (from the past 24 hour(s))  Lipase, blood     Status: None   Collection Time: 11/12/19  3:20  AM  Result Value Ref Range   Lipase 35 11 - 51 U/L  Comprehensive metabolic panel     Status: Abnormal   Collection Time: 11/12/19  3:20 AM  Result Value Ref Range   Sodium 130 (L) 135 - 145 mmol/L   Potassium 3.6 3.5 - 5.1 mmol/L   Chloride 97 (L) 98 - 111 mmol/L   CO2 22 22 - 32 mmol/L   Glucose, Bld 171 (H) 70 - 99 mg/dL   BUN 15 6 - 20 mg/dL   Creatinine, Ser 0.82 0.44 - 1.00 mg/dL   Calcium 8.7 (L) 8.9 - 10.3 mg/dL   Total Protein 7.3 6.5 - 8.1 g/dL   Albumin 4.8 3.5 - 5.0 g/dL   AST 19 15 - 41 U/L   ALT 17 0 - 44 U/L   Alkaline  Phosphatase 48 38 - 126 U/L   Total Bilirubin 0.6 0.3 - 1.2 mg/dL   GFR calc non Af Amer >60 >60 mL/min   GFR calc Af Amer >60 >60 mL/min   Anion gap 11 5 - 15  CBC     Status: Abnormal   Collection Time: 11/12/19  3:20 AM  Result Value Ref Range   WBC 14.9 (H) 4.0 - 10.5 K/uL   RBC 4.11 3.87 - 5.11 MIL/uL   Hemoglobin 13.3 12.0 - 15.0 g/dL   HCT 40.7 36.0 - 46.0 %   MCV 99.0 80.0 - 100.0 fL   MCH 32.4 26.0 - 34.0 pg   MCHC 32.7 30.0 - 36.0 g/dL   RDW 12.8 11.5 - 15.5 %   Platelets 339 150 - 400 K/uL   nRBC 0.0 0.0 - 0.2 %  I-Stat beta hCG blood, ED     Status: None   Collection Time: 11/12/19  3:27 AM  Result Value Ref Range   I-stat hCG, quantitative <5.0 <5 mIU/mL   Comment 3          Urinalysis, Routine w reflex microscopic     Status: Abnormal   Collection Time: 11/12/19  4:52 AM  Result Value Ref Range   Color, Urine YELLOW YELLOW   APPearance CLOUDY (A) CLEAR   Specific Gravity, Urine 1.019 1.005 - 1.030   pH 6.0 5.0 - 8.0   Glucose, UA NEGATIVE NEGATIVE mg/dL   Hgb urine dipstick LARGE (A) NEGATIVE   Bilirubin Urine NEGATIVE NEGATIVE   Ketones, ur 5 (A) NEGATIVE mg/dL   Protein, ur 30 (A) NEGATIVE mg/dL   Nitrite NEGATIVE NEGATIVE   Leukocytes,Ua TRACE (A) NEGATIVE   RBC / HPF >50 (H) 0 - 5 RBC/hpf   WBC, UA 11-20 0 - 5 WBC/hpf   Bacteria, UA MANY (A) NONE SEEN   Squamous Epithelial / LPF 21-50 0 - 5   Mucus PRESENT    Ca Oxalate Crys, UA PRESENT   SARS Coronavirus 2 by RT PCR (hospital order, performed in [place] hospital lab) Nasopharyngeal Nasopharyngeal Swab     Status: None   Collection Time: 11/12/19 11:55 AM   Specimen: Nasopharyngeal Swab  Result Value Ref Range   SARS Coronavirus 2 NEGATIVE NEGATIVE   Recent Results (from the past 240 hour(s))  SARS Coronavirus 2 by RT PCR (hospital order, performed in [place] hospital lab) Nasopharyngeal Nasopharyngeal Swab     Status: None   Collection Time: 11/12/19 11:55 AM   Specimen: Nasopharyngeal  Swab  Result Value Ref Range Status   SARS Coronavirus 2 NEGATIVE NEGATIVE Final    Comment: (NOTE) If result  is NEGATIVE SARS-CoV-2 target nucleic acids are NOT DETECTED. The SARS-CoV-2 RNA is generally detectable in upper and lower  respiratory specimens during the acute phase of infection. The lowest  concentration of SARS-CoV-2 viral copies this assay can detect is 250  copies / mL. A negative result does not preclude SARS-CoV-2 infection  and should not be used as the sole basis for treatment or other  patient management decisions.  A negative result may occur with  improper specimen collection / handling, submission of specimen other  than nasopharyngeal swab, presence of viral mutation(s) within the  areas targeted by this assay, and inadequate number of viral copies  (<250 copies / mL). A negative result must be combined with clinical  observations, patient history, and epidemiological information. If result is POSITIVE SARS-CoV-2 target nucleic acids are DETECTED. The SARS-CoV-2 RNA is generally detectable in upper and lower  respiratory specimens dur ing the acute phase of infection.  Positive  results are indicative of active infection with SARS-CoV-2.  Clinical  correlation with patient history and other diagnostic information is  necessary to determine patient infection status.  Positive results do  not rule out bacterial infection or co-infection with other viruses. If result is PRESUMPTIVE POSTIVE SARS-CoV-2 nucleic acids MAY BE PRESENT.   A presumptive positive result was obtained on the submitted specimen  and confirmed on repeat testing.  While 2019 novel coronavirus  (SARS-CoV-2) nucleic acids may be present in the submitted sample  additional confirmatory testing may be necessary for epidemiological  and / or clinical management purposes  to differentiate between  SARS-CoV-2 and other Sarbecovirus currently known to infect humans.  If clinically indicated  additional testing with an alternate test  methodology [phone]) is advised. The SARS-CoV-2 RNA is generally  detectable in upper and lower respiratory sp ecimens during the acute  phase of infection. The expected result is Negative. Fact Sheet for Patients:  BoilerBrush.com.cy Fact Sheet for Healthcare Providers: https://pope.com/ This test is not yet approved or cleared by the [place] FDA and has been authorized for detection and/or diagnosis of SARS-CoV-2 by FDA under an Emergency Use Authorization (EUA).  This EUA will remain in effect (meaning this test can be used) for the duration of the COVID-19 declaration under Section 564(b)(1) of the Act, 21 U.S.C. section 360bbb-3(b)(1), unless the authorization is terminated or revoked sooner. Performed at [facility], [address] [place], [place] [address]    Creatinine: Recent Labs    11/12/19 0320  CREATININE 0.82    Impression/Assessment/plan:  I discussed with the patient the nature, potential benefits, risks and alternatives to cystoscopy with left retrograde pyelogram and left ureteral stent placement, including side effects of the proposed treatment, the likelihood of the patient achieving the goals of the procedure, and any potential problems that might occur during the procedure or recuperation.  We discussed the rationale for a staged procedure and we went over future shockwave lithotripsy or ureteroscopy for the renal stone.  She would like to do whichever is more efficient to have the stent removed soon as possible.  All questions answered. Patient elects to proceed with left ureteral stent.   Jerilee Field 11/12/2019, 3:37 PM

## 2019-11-12 NOTE — ED Notes (Signed)
Pt ambulatory to the restroom without assistance. Steady gait noted.  

## 2019-11-12 NOTE — ED Notes (Signed)
Patient ambulated to the restroom without assistance 

## 2019-11-12 NOTE — Discharge Instructions (Signed)

## 2019-11-12 NOTE — Anesthesia Preprocedure Evaluation (Addendum)
Anesthesia Evaluation  Patient identified by MRN, date of birth, ID band Patient awake    Reviewed: Allergy & Precautions, NPO status , Patient's Chart, lab work & pertinent test results  Airway Mallampati: II  TM Distance: >3 FB Neck ROM: Full    Dental no notable dental hx. (+) Dental Advisory Given, Teeth Intact   Pulmonary neg pulmonary ROS, Current SmokerPatient did not abstain from smoking.,    Pulmonary exam normal breath sounds clear to auscultation       Cardiovascular negative cardio ROS Normal cardiovascular exam Rhythm:Regular Rate:Normal     Neuro/Psych PSYCHIATRIC DISORDERS Anxiety negative neurological ROS     GI/Hepatic GERD  ,(+)     substance abuse  alcohol use,   Endo/Other  negative endocrine ROS  Renal/GU negative Renal ROS  negative genitourinary   Musculoskeletal  (+) Arthritis ,   Abdominal   Peds  Hematology negative hematology ROS (+)   Anesthesia Other Findings   Reproductive/Obstetrics                            Anesthesia Physical Anesthesia Plan  ASA: II and emergent  Anesthesia Plan: General   Post-op Pain Management:    Induction: Intravenous and Rapid sequence  PONV Risk Score and Plan: 2 and Midazolam, Dexamethasone and Ondansetron  Airway Management Planned: Oral ETT  Additional Equipment:   Intra-op Plan:   Post-operative Plan: Extubation in OR  Informed Consent: I have reviewed the patients History and Physical, chart, labs and discussed the procedure including the risks, benefits and alternatives for the proposed anesthesia with the patient or authorized representative who has indicated his/her understanding and acceptance.     Dental advisory given  Plan Discussed with: CRNA  Anesthesia Plan Comments:         Anesthesia Quick Evaluation

## 2019-11-12 NOTE — Progress Notes (Signed)
PACU NURSING NOTE: Anesthesiologist notified secondary to slow downward trend of NBP. (82/49, MAP 61). GCS 15, alert and oriented, cap refill <3 seconds, 2 plus radial pulses, urine output 130ml in one (1) hour. Order rec to give 1liter NS fluid bolus at 960ml/hr. Order initiated, will cont to monitor and observe

## 2019-11-12 NOTE — ED Provider Notes (Signed)
[place] DEPT Provider Note   CSN: [id number] Arrival date & time: 11/12/19  0220     History   Chief Complaint Abdominal pain, emesis  HPI [name] is a 33 y.o. female past medical history significant for acid reflux, endometriosis, kidney stones presents to emergency department today with chief complaint of left sided abdominal pain x 2 hours. Patient states pain had sudden onset. She reports associated nausea with 3 episodes of non bloody and non bilious emesis.  She describes pain as sharp and throbbing sensation.  Pain has been constant.  She did not take anything for pain prior to arrival.  She rates pain 10/10 in severity. She is unable to tolerate PO intake.   She reports history of right-sided kidney stones, she states this was a very long time ago and unsure if it feels similar today. LMP x 2 weeks ago. She denies fever, chills, chest pain, shortness of breath, gross hematuria, urinary frequency, dysuria, diarrhea, blood in stool. Denies abdominal surgical history   Past Medical History:  Diagnosis Date   Acid reflux OCCASIONAL   CIN III (cervical intraepithelial neoplasia III)    Endometriosis    Frequency of urination    nocturia, hematuria, urgency    History of alcohol abuse AS TEEN   History of kidney stones    ureteral stone right   History of major depression AGE 70-  SUICIDE ATTEMPT   PT STATES NO PROBLEMS SINCE   History of suicide attempt WRIST CUTTINE   PT STATES LAST TIME AGE 72--  NO ATTEMPTS SINCE--  RECEIVED COUNSELING   Lumbar facet arthropathy    OA (osteoarthritis of spine) LUMBAR --  PER PT   UTI (lower urinary tract infection)     Patient Active Problem List   Diagnosis Date Noted   Hyponatremia 11/12/2019   Leukocytosis 11/12/2019   Hydronephrosis with renal and ureteral calculus obstruction 11/12/2019   Renal stone 11/12/2019   Allergic rhinitis 02/18/2017   Substance induced mood  disorder ([place]) 02/07/2017   Aphthous ulcer 03/20/2016   Elevated liver enzymes 07/02/2015   Unspecified constipation 04/01/2014   Alcohol dependence, binge pattern ([place]) 03/30/2014   Hemorrhoids 04/16/2012   ANEMIA 08/24/2008   Generalized anxiety disorder 08/24/2008   ALCOHOL ABUSE, HX OF 08/24/2008   NEPHROLITHIASIS 08/16/2008   TOBACCO ABUSE 05/19/2008   RENAL CALCULUS, HX OF 03/21/2008    Past Surgical History:  Procedure Laterality Date   CERVICAL BIOPSY  W/ LOOP ELECTRODE EXCISION  FEB  2013   CIN III  (GYN OFFICE)   CYSTOSCOPY WITH URETEROSCOPY  04/20/2012   Procedure: CYSTOSCOPY WITH URETEROSCOPY;  Surgeon: Fredricka Bonine, MD;  Location: [facility];  Service: Urology;  Laterality: N/A;  CYSTO, RIGHT URETEROSCOPY ,  STONE BASKET EXTRACTION   CARM LASER     ORIF RIGHT LITTLE FINGER FX  2006   WISDOM TOOTH EXTRACTION  2012   ORAL SURGEON OFFICE     OB History    Gravida  0   Para  0   Term  0   Preterm  0   AB  0   Living  0     SAB  0   TAB  0   Ectopic  0   Multiple  0   Live Births               Home Medications    Prior to Admission medications   Medication Sig Start Date  End Date Taking? Authorizing Provider  ALPRAZolam (XANAX) 0.25 MG tablet Take 0.25 mg by mouth 2 (two) times daily as needed for anxiety.    Yes [provider]    Family History Family History  Problem Relation Age of Onset   Heart failure Father    Cancer Mother    Hepatitis Other        Autoimmune treated with prednisone    Social History Social History   Tobacco Use   Smoking status: Current Every Day Smoker    Packs/day: 0.50    Years: 8.00    Pack years: 4.00    Types: Cigarettes   Smokeless tobacco: Never Used  Substance Use Topics   Alcohol use: Yes    Alcohol/week: 0.0 standard drinks    Comment: socially   Drug use: No    Types: Marijuana    Comment: HISTORY MARIJIUANA USE YRS AGO--  PT  STATES HAS NOT USED SINCE     Allergies   Codeine and Bactrim [sulfamethoxazole-trimethoprim]   Review of Systems Review of Systems  Constitutional: Negative for chills and fever.  HENT: Negative for congestion, ear discharge, ear pain, sinus pressure, sinus pain and sore throat.   Eyes: Negative for pain and redness.  Respiratory: Negative for cough and shortness of breath.   Cardiovascular: Negative for chest pain.  Gastrointestinal: Positive for abdominal pain, nausea and vomiting. Negative for blood in stool, constipation and diarrhea.  Genitourinary: Negative for dysuria and hematuria.  Musculoskeletal: Negative for back pain and neck pain.  Skin: Negative for wound.  Neurological: Negative for weakness, numbness and headaches.     Physical Exam Updated Vital Signs BP (!) 141/71    Pulse (!) 102    Temp 98.4 F (36.9 C) (Oral)    Resp 18    Ht 5' (1.524 m)    Wt 61.2 kg    SpO2 100%    BMI 26.37 kg/m   Physical Exam Vitals signs and nursing note reviewed.  Constitutional:      General: She is not in acute distress.    Appearance: She is not ill-appearing.     Comments: Patient looks to be uncomfortable however is non toxic appearing.  HENT:     Head: Normocephalic and atraumatic.     Right Ear: Tympanic membrane and external ear normal.     Left Ear: Tympanic membrane and external ear normal.     Nose: Nose normal.     Mouth/Throat:     Mouth: Mucous membranes are moist.     Pharynx: Oropharynx is clear.  Eyes:     General: No scleral icterus.       Right eye: No discharge.        Left eye: No discharge.     Extraocular Movements: Extraocular movements intact.     Conjunctiva/sclera: Conjunctivae normal.     Pupils: Pupils are equal, round, and reactive to light.  Neck:     Musculoskeletal: Normal range of motion.     Vascular: No JVD.  Cardiovascular:     Rate and Rhythm: Regular rhythm. Tachycardia present.     Pulses: Normal pulses.          Radial  pulses are 2+ on the right side and 2+ on the left side.     Heart sounds: Normal heart sounds.  Pulmonary:     Comments: Lungs clear to auscultation in all fields. Symmetric chest rise. No wheezing, rales, or rhonchi. Abdominal:  General: Bowel sounds are normal.     Tenderness: There is left CVA tenderness. There is no right CVA tenderness.     Comments: Abdomen is soft, non-distended.  Tenderness to palpation of LUQ and LLQ with voluntary guarding. No rigidity. No peritoneal signs.  Musculoskeletal: Normal range of motion.  Skin:    General: Skin is warm and dry.     Capillary Refill: Capillary refill takes less than 2 seconds.  Neurological:     Mental Status: She is oriented to person, place, and time.     GCS: GCS eye subscore is 4. GCS verbal subscore is 5. GCS motor subscore is 6.     Comments: Fluent speech, no facial droop.  Psychiatric:        Behavior: Behavior normal.      ED Treatments / Results  Labs (all labs ordered are listed, but only abnormal results are displayed) Labs Reviewed  COMPREHENSIVE METABOLIC PANEL - Abnormal; Notable for the following components:      Result Value   Sodium 130 (*)    Chloride 97 (*)    Glucose, Bld 171 (*)    Calcium 8.7 (*)    All other components within normal limits  CBC - Abnormal; Notable for the following components:   WBC 14.9 (*)    All other components within normal limits  URINALYSIS, ROUTINE W REFLEX MICROSCOPIC - Abnormal; Notable for the following components:   APPearance CLOUDY (*)    Hgb urine dipstick LARGE (*)    Ketones, ur 5 (*)    Protein, ur 30 (*)    Leukocytes,Ua TRACE (*)    RBC / HPF >50 (*)    Bacteria, UA MANY (*)    All other components within normal limits  SARS CORONAVIRUS 2 BY RT PCR (HOSPITAL ORDER, PERFORMED IN [facility])  URINE CULTURE  LIPASE, BLOOD  I-STAT BETA HCG BLOOD, ED ([facility], [facility], [facility] ONLY)    EKG None  Radiology Ct Abdomen Pelvis W Contrast  Result Date:  11/12/2019 CLINICAL DATA:  Severe left flank pain. EXAM: CT ABDOMEN AND PELVIS WITH CONTRAST TECHNIQUE: Multidetector CT imaging of the abdomen and pelvis was performed using the standard protocol following bolus administration of intravenous contrast. CONTRAST:  OMNIPAQUE IOHEXOL 300 MG/ML  SOLN COMPARISON:  04/06/2014 FINDINGS: Lower chest: Unremarkable. Hepatobiliary: No suspicious focal abnormality within the liver parenchyma. Tiny hypodensity in the dome of the left liver is too small to characterize but likely benign. There is no evidence for gallstones, gallbladder wall thickening, or pericholecystic fluid. No intrahepatic or extrahepatic biliary dilation. Pancreas: No focal mass lesion. No dilatation of the main duct. No intraparenchymal cyst. No peripancreatic edema. Spleen: No splenomegaly. No focal mass lesion. Adrenals/Urinary Tract: No adrenal nodule or mass. 9 mm low-density lesion upper pole right kidney is similar to prior and compatible with a cyst. There is mild to moderate left hydronephrosis with decreased perfusion to the left kidney. 2 mm nonobstructing stone noted lower pole left kidney. 6 x 6 x 9 mm stone is identified at the left UPJ with extensive left perinephric and peripelvic edema. No ureteral or bladder stones. Stomach/Bowel: Tiny hiatal hernia. Stomach otherwise unremarkable. Duodenum is normally positioned as is the ligament of Treitz. No small bowel wall thickening. No small bowel dilatation. The terminal ileum is normal. The appendix is normal. No gross colonic mass. No colonic wall thickening. Diverticular changes noted in the left colon without diverticulitis. Vascular/Lymphatic: No abdominal aortic aneurysm. No abdominal  lymphadenopathy No pelvic sidewall lymphadenopathy. Reproductive: The uterus is unremarkable.  There is no adnexal mass. Other: Trace free fluid in the cul-de-sac. Musculoskeletal: No worrisome lytic or sclerotic osseous abnormality. IMPRESSION: 1. 2 mm  left lower pole nonobstructing stone with 6 x 6 x 9 mm left UPJ stone causing mild to moderate left hydronephrosis and obstructive uropathy. No other urinary stone disease. Electronically Signed   By: Kennith CenterEric  Mansell M.D.   On: 11/12/2019 09:36    Procedures Procedures (including critical care time)  Medications Ordered in ED Medications  sodium chloride (PF) 0.9 % injection (has no administration in time range)  ondansetron (ZOFRAN-ODT) disintegrating tablet 4 mg (4 mg Oral Given 11/12/19 0332)  morphine 4 MG/ML injection 4 mg (4 mg Intravenous Given 11/12/19 0848)  ondansetron (ZOFRAN) injection 4 mg (4 mg Intravenous Given 11/12/19 0849)  sodium chloride 0.9 % bolus 1,000 mL (1,000 mLs Intravenous New Bag/Given 11/12/19 0848)  iohexol (OMNIPAQUE) 300 MG/ML solution 100 mL (100 mLs Intravenous Contrast Given 11/12/19 0857)  fentaNYL (SUBLIMAZE) injection 50 mcg (50 mcg Intravenous Given 11/12/19 0934)  morphine 4 MG/ML injection 6 mg (6 mg Intravenous Given 11/12/19 1147)     Initial Impression / Assessment and Plan / ED Course  I have reviewed the triage vital signs and the nursing notes.  Pertinent labs & imaging results that were available during my care of the patient were reviewed by me and considered in my medical decision making (see chart for details).   Patient presents to the ED with complaints of abdominal pain. Patient nontoxic appearing, in no apparent distress however looks to be uncomfortable. On exam patient tender to left upper and lower quagrants, no peritoneal signs. Will evaluate with labs and CT A/P. Analgesics, anti-emetics, and fluids administered. DDx includes kidney stone, diverticulitis, gastritis, appendicitis, biliary colic, cholecystitis, less likely SBO or dissection. I reviewed labs ordered in triage.  Patient has leukocytosis of 14.9, hemoglobin is stable at 13.3.  CMP shows hyponatremia to 130, consistent with patient's baseline.  UA with large hemoglobin,  trace leukocytes, over 50 WBC, 11-20 WBC, many bacteria, likely infection given pt's presenting symptoms. Will send for culture. Pain not controlled with morphine and fentanyl as well ass continued nausea despite zofran. She declines dilaudid as she does not like how it has made her feel in the past.  Imaging shows  2 mm left lower pole nonobstructing stone with 6 x 6 x 9 mm left UPJ stone causing mild to moderate left hydronephrosis and obstructive uropathy. This case was discussed with Dr. Rubin PayorPickering who agrees with plan to admit.Spoke with on call nephrologist Dr. Mena GoesEskridge who will take pt for a stent today. He recommends hospitalist admission for her tachycardia, leukocytosis. Case discussed with Dr. Tyson BabinskiPokhrel  with hospitalist service who agrees to assume care of patient and bring into the hospital for further evaluation and management.    Portions of this note were generated with Scientist, clinical (histocompatibility and immunogenetics)Dragon dictation software. Dictation errors may occur despite best attempts at proofreading.    Final Clinical Impressions(s) / ED Diagnoses   Final diagnoses:  Obstructive uropathy  Ureteral stone with hydronephrosis    ED Discharge Orders    None       Kathyrn Lasslbrizze, Kenny Rea E, PA-C 11/12/19 1219    Benjiman CorePickering, Nathan, MD 11/12/19 1505

## 2019-11-12 NOTE — ED Triage Notes (Signed)
Patient complaining of L sided abd pain x2 hours, pain happended suddenly, patient has been nauseated, small episodes of emesis x3 since the pain started.

## 2019-11-12 NOTE — ED Notes (Signed)
Patient is aware that another urine sample is needed.

## 2019-11-12 NOTE — Op Note (Signed)
Preoperative diagnosis: Left UPJ stone, sepsis Postoperative diagnosis: Same  Procedure: Cystoscopy with left retrograde pyelogram, left ureteral stent placement  Surgeon: Junious Silk  Anesthesia: General  Indication for procedure: 33 year old female with left UPJ stone, hydronephrosis and signs of sepsis.  She had many bacteria on urinalysis.  Findings: On [facility] there was a delayed left nephrogram with obstruction of contrast in the renal parenchyma and collecting system coming out to the UPJ or proximal ureter.  I did not appreciate the actual stone or a good filling defect for the stone but there was certainly signs of continued obstruction.  There was no stone or foreign body in the bladder.  Left retropyelogram-this outlined a single ureter single collecting system unit again without obvious filling defect but contrast did make it up into the left collecting system which again had delayed washout from the prior CT contrast.  Description of procedure: After consent was obtained patient brought to the operating room.  After adequate anesthesia she was placed in lithotomy position and prepped and draped in the usual sterile fashion.  A timeout was performed to confirm the patient and procedure.  The cystoscope was passed per urethra and the bladder carefully inspected.  The left ureteral orifice was cannulated with a 6 [place] open-ended catheter and left retrograde injection of contrast was performed.  A sensor wire was then advanced and coiled in the midpole calyx.  Over the wire a 6 x 24 cm ureteral stent was advanced.  The wire was removed with a good coil seen in the kidney and a good coil in the bladder.  The scope was removed and a 22 [place] Foley catheter was placed in left to gravity drainage to max drain the system.  She was awakened and taken to the recovery room in stable condition although she did require pressor support.  Her heart rate decreased from the 140s to the  100s.  Complications: None  Blood loss: Minimal  Specimens: None  Drains: Foley catheter, 6 x 24 cm left ureteral stent  Disposition: Patient stable to PACU

## 2019-11-12 NOTE — H&P (Addendum)
[facility] History and Physical  Stephanie AstersChelsea Rene Weckerly ZOX:[id number] DOB: [birth year] DOA: 11/12/2019  Referring physician: ED  PCP: Patient, No Pcp Per   Chief Complaint:  Abdominal pain  HPI: [name] is a 33 y.o. female with past medical history of GERD, alcohol abuse, history of kidney stones, depression, presented to the hospital with complaints of abdominal pain and vomiting.  Patient states that she was fine until she had sudden onset of severe left abdominal pain for 2 hours prior to presentation.  This was associated with nausea and 2-3 episodes of nonbloody bilious vomiting.  Patient was severe in intensity around 10/ 10 intensity.  Patient does have history of kidney stones in the past.  Denies any hematuria, urgency, frequency or dysuria.  Patient denies any sick contacts or recent travel.  ED Course: In the ED, patient noted to be tachycardic with a leukocytosis.  Urology was consulted and due to findings of tachycardia liver elevated white count medicine team was requested for observation in the hospital.  Subsequently, patient developed fever and remained tachycardic.  Review of Systems:  All systems were reviewed and were negative unless otherwise mentioned in the HPI  Past Medical History:  Diagnosis Date   Acid reflux OCCASIONAL   CIN III (cervical intraepithelial neoplasia III)    Endometriosis    Frequency of urination    nocturia, hematuria, urgency    History of alcohol abuse AS TEEN   History of kidney stones    ureteral stone right   History of major depression AGE 5-  SUICIDE ATTEMPT   PT STATES NO PROBLEMS SINCE   History of suicide attempt WRIST CUTTINE   PT STATES LAST TIME AGE 5--  NO ATTEMPTS SINCE--  RECEIVED COUNSELING   Lumbar facet arthropathy    OA (osteoarthritis of spine) LUMBAR --  PER PT   UTI (lower urinary tract infection)    Past Surgical History:  Procedure Laterality Date   CERVICAL BIOPSY  W/ LOOP  ELECTRODE EXCISION  FEB  2013   CIN III  (GYN OFFICE)   CYSTOSCOPY WITH URETEROSCOPY  04/20/2012   Procedure: CYSTOSCOPY WITH URETEROSCOPY;  Surgeon: Antony HasteMatthew Ramsey Eskridge, MD;  [facility];  Service: Urology;  Laterality: N/A;  CYSTO, RIGHT URETEROSCOPY ,  STONE BASKET EXTRACTION   CARM LASER     ORIF RIGHT LITTLE FINGER FX  2006   WISDOM TOOTH EXTRACTION  2012   ORAL SURGEON OFFICE    Social History:  reports that she has been smoking cigarettes. She has a 4.00 pack-year smoking history. She has never used smokeless tobacco. She reports current alcohol use. She reports that she does not use drugs.  Allergies  Allergen Reactions   Codeine Itching   Bactrim [Sulfamethoxazole-Trimethoprim] Hives and Rash    Family History  Problem Relation Age of Onset   Heart failure Father    Cancer Mother    Hepatitis Other        Autoimmune treated with prednisone     Prior to Admission medications   Medication Sig Start Date End Date Taking? Authorizing Provider  ALPRAZolam (XANAX) 0.25 MG tablet Take 0.25 mg by mouth 2 (two) times daily as needed for anxiety.    Yes [provider]    Physical Exam: Vitals:   11/12/19 0731 11/12/19 0910 11/12/19 1030 11/12/19 1055  BP: 138/88 (!) 141/71 125/78 133/80  Pulse: (!) 107 (!) 102 (!) 116 (!) 123  Resp: 18 18  18   Temp:  TempSrc:      SpO2: 100% 100% 100% 96%  Weight:      Height:       Wt Readings from Last 3 Encounters:  11/12/19 61.2 kg  09/08/18 56.7 kg  03/29/18 51.5 kg   Body mass index is 26.37 kg/m.  General:  Average built.  In mild distress due to pain. HENT: Normocephalic, pupils equally reacting to light and accommodation.  No scleral pallor or icterus noted. Oral mucosa is moist.  Chest:  Clear breath sounds.  Diminished breath sounds bilaterally. No crackles or wheezes.  CVS: S1 &S2 heard. No murmur.  Regular rate and rhythm. Abdomen: Soft, nondistended.  Bowel sounds  are heard.  Liver is not palpable, no abdominal mass palpated.  Left costovertebral angle tenderness. Extremities: No cyanosis, clubbing or edema.  Peripheral pulses are palpable. Psych: Alert, awake and oriented, normal mood CNS:  No cranial nerve deficits.  Power equal in all extremities.   No cerebellar signs.   Skin: Warm and dry.  No rashes noted.  Labs on Admission:   CBC: Recent Labs  Lab 11/12/19 0320  WBC 14.9*  HGB 13.3  HCT 40.7  MCV 99.0  PLT 829    Basic Metabolic Panel: Recent Labs  Lab 11/12/19 0320  NA 130*  K 3.6  CL 97*  CO2 22  GLUCOSE 171*  BUN 15  CREATININE 0.82  CALCIUM 8.7*    Liver Function Tests: Recent Labs  Lab 11/12/19 0320  AST 19  ALT 17  ALKPHOS 48  BILITOT 0.6  PROT 7.3  ALBUMIN 4.8   Recent Labs  Lab 11/12/19 0320  LIPASE 35   No results for input(s): AMMONIA in the last 168 hours.  Cardiac Enzymes: No results for input(s): CKTOTAL, CKMB, CKMBINDEX, TROPONINI in the last 168 hours.  BNP (last 3 results) No results for input(s): BNP in the last 8760 hours.  ProBNP (last 3 results) No results for input(s): PROBNP in the last 8760 hours.  CBG: No results for input(s): GLUCAP in the last 168 hours.  Lipase     Component Value Date/Time   LIPASE 35 11/12/2019 0320     Urinalysis    Component Value Date/Time   COLORURINE YELLOW 11/12/2019 0452   APPEARANCEUR CLOUDY (A) 11/12/2019 0452   LABSPEC 1.019 11/12/2019 0452   PHURINE 6.0 11/12/2019 0452   GLUCOSEU NEGATIVE 11/12/2019 0452   HGBUR LARGE (A) 11/12/2019 0452   HGBUR negative 03/21/2008 1502   BILIRUBINUR NEGATIVE 11/12/2019 0452   BILIRUBINUR negative 11/03/2016 1717   BILIRUBINUR neg. 07/21/2014 1147   KETONESUR 5 (A) 11/12/2019 0452   PROTEINUR 30 (A) 11/12/2019 0452   UROBILINOGEN 0.2 11/03/2016 1717   UROBILINOGEN 0.2 04/21/2015 1623   NITRITE NEGATIVE 11/12/2019 0452   LEUKOCYTESUR TRACE (A) 11/12/2019 0452     Drugs of Abuse       Component Value Date/Time   LABOPIA POSITIVE (A) 02/06/2017 2241   COCAINSCRNUR NONE DETECTED 02/06/2017 2241   LABBENZ POSITIVE (A) 02/06/2017 2241   AMPHETMU NONE DETECTED 02/06/2017 2241   THCU POSITIVE (A) 02/06/2017 2241   LABBARB NONE DETECTED 02/06/2017 2241      Radiological Exams on Admission: Ct Abdomen Pelvis W Contrast  Result Date: 11/12/2019 CLINICAL DATA:  Severe left flank pain. EXAM: CT ABDOMEN AND PELVIS WITH CONTRAST TECHNIQUE: Multidetector CT imaging of the abdomen and pelvis was performed using the standard protocol following bolus administration of intravenous contrast. CONTRAST:  181mL OMNIPAQUE IOHEXOL 300 MG/ML  SOLN  COMPARISON:  04/06/2014 FINDINGS: Lower chest: Unremarkable. Hepatobiliary: No suspicious focal abnormality within the liver parenchyma. Tiny hypodensity in the dome of the left liver is too small to characterize but likely benign. There is no evidence for gallstones, gallbladder wall thickening, or pericholecystic fluid. No intrahepatic or extrahepatic biliary dilation. Pancreas: No focal mass lesion. No dilatation of the main duct. No intraparenchymal cyst. No peripancreatic edema. Spleen: No splenomegaly. No focal mass lesion. Adrenals/Urinary Tract: No adrenal nodule or mass. 9 mm low-density lesion upper pole right kidney is similar to prior and compatible with a cyst. There is mild to moderate left hydronephrosis with decreased perfusion to the left kidney. 2 mm nonobstructing stone noted lower pole left kidney. 6 x 6 x 9 mm stone is identified at the left UPJ with extensive left perinephric and peripelvic edema. No ureteral or bladder stones. Stomach/Bowel: Tiny hiatal hernia. Stomach otherwise unremarkable. Duodenum is normally positioned as is the ligament of Treitz. No small bowel wall thickening. No small bowel dilatation. The terminal ileum is normal. The appendix is normal. No gross colonic mass. No colonic wall thickening. Diverticular changes noted  in the left colon without diverticulitis. Vascular/Lymphatic: No abdominal aortic aneurysm. No abdominal lymphadenopathy No pelvic sidewall lymphadenopathy. Reproductive: The uterus is unremarkable.  There is no adnexal mass. Other: Trace free fluid in the cul-de-sac. Musculoskeletal: No worrisome lytic or sclerotic osseous abnormality. IMPRESSION: 1. 2 mm left lower pole nonobstructing stone with 6 x 6 x 9 mm left UPJ stone causing mild to moderate left hydronephrosis and obstructive uropathy. No other urinary stone disease. Electronically Signed   By: Kennith Center M.D.   On: 11/12/2019 09:36    EKG: Not available for review  Assessment/Plan Principal Problem:   NEPHROLITHIASIS Active Problems:   Hyponatremia   Leukocytosis   Hydronephrosis with renal and ureteral calculus obstruction   Obstructive uropathy secondary to ureteral stone with moderate hydronephrosis.  History of nephrolithiasis.  Urology has been consulted from the ED.  Plan for ureteric stent placement today.  Continue IV fluids n.p.o. analgesics antiemetics for now.  Rocephin IV.  Mild hyponatremia.  Continue with IV fluid hydration.  On normal saline.  Leukocytosis with tachycardia on presentation.  Subsequent development of fever in the ED.  Likely secondary to infected stone.  Urology requesting medicine to admit the patient.  Follow urine culture.  Patient is afebrile at this time.  Continue IV Rocephin.  Check blood cultures.  Antipyretics.   DVT Prophylaxis: Low risk  Consultant: Urology  Code Status: Full code  Microbiology urine cultures Sent blood cultures  Antibiotics: Rocephin IV  Family Communication:  Patients' condition and plan of care including tests being ordered have been discussed with the patient and the patient's fianc who indicate understanding and agree with the plan.  Disposition Plan: Home  Severity of Illness: The appropriate patient status for this patient is OBSERVATION. Observation  status is judged to be reasonable and necessary in order to provide the required intensity of service to ensure the patient's safety. The patient's presenting symptoms, physical exam findings, and initial radiographic and laboratory data in the context of their medical condition is felt to place them at decreased risk for further clinical deterioration. Furthermore, it is anticipated that the patient will be medically stable for discharge from the hospital within 2 midnights of admission. The following factors support the patient status of observation.     Signed, Joycelyn Das, MD [facility] 11/12/2019

## 2019-11-12 NOTE — Transfer of Care (Signed)
Immediate Anesthesia Transfer of Care Note  Patient: [name]  Procedure(s) Performed: CYSTOSCOPY WITH RETROGRADE PYELOGRAM/URETERAL STENT PLACEMENT (Left Ureter)  Patient Location: PACU  Anesthesia Type:General  Level of Consciousness: awake and patient cooperative  Airway & Oxygen Therapy: Patient Spontanous Breathing and Patient connected to face mask oxygen  Post-op Assessment: Report given to RN and Post -op Vital signs reviewed and stable  Post vital signs: Reviewed and stable  Last Vitals:  Vitals Value Taken Time  BP    Temp    Pulse 126 11/12/19 1702  Resp 24 11/12/19 1702  SpO2 99 % 11/12/19 1702  Vitals shown include unvalidated device data.  Last Pain:  Vitals:   11/12/19 1329  TempSrc:   PainSc: 9          Complications: No apparent anesthesia complications

## 2019-11-12 NOTE — H&P (View-Only) (Signed)
Consultation: Left UPJ stone, sepsis Requested by: Dr. Nathan Pickering  History of Present Illness: [name] is a 33-year-old female with a history of stones.  She had a known left lower pole stone but did not follow-up for surveillance with me.  She developed the acute onset of left flank pain yesterday and presented to emergency.  White count was 14, UA with many bacteria and heart rate of 123.  CT scan with 9 mm left UPJ stone and hydronephrosis and therefore decision was made for urgent stenting.  Rapid Covid testing just returned negative.  She has become progressively febrile and tachycardic today.  Past Medical History:  Diagnosis Date  . Acid reflux OCCASIONAL  . CIN III (cervical intraepithelial neoplasia III)   . Endometriosis   . Frequency of urination    nocturia, hematuria, urgency   . History of alcohol abuse AS TEEN  . History of kidney stones    ureteral stone right  . History of major depression AGE 17-  SUICIDE ATTEMPT   PT STATES NO PROBLEMS SINCE  . History of suicide attempt WRIST CUTTINE   PT STATES LAST TIME AGE 17--  NO ATTEMPTS SINCE--  RECEIVED COUNSELING  . Lumbar facet arthropathy   . OA (osteoarthritis of spine) LUMBAR --  PER PT  . UTI (lower urinary tract infection)    Past Surgical History:  Procedure Laterality Date  . CERVICAL BIOPSY  W/ LOOP ELECTRODE EXCISION  FEB  2013   CIN III  (GYN OFFICE)  . CYSTOSCOPY WITH URETEROSCOPY  04/20/2012   Procedure: CYSTOSCOPY WITH URETEROSCOPY;  Surgeon: Johnye Kist Ramsey Winthrop Shannahan, MD;  Location: [facility];  Service: Urology;  Laterality: N/A;  CYSTO, RIGHT URETEROSCOPY ,  STONE BASKET EXTRACTION   CARM LASER    . ORIF RIGHT LITTLE FINGER FX  2006  . WISDOM TOOTH EXTRACTION  2012   ORAL SURGEON OFFICE    Home Medications:  (Not in a hospital admission)  Allergies:  Allergies  Allergen Reactions  . Codeine Itching  . Bactrim [Sulfamethoxazole-Trimethoprim] Hives and Rash    Family History   Problem Relation Age of Onset  . Heart failure Father   . Cancer Mother   . Hepatitis Other        Autoimmune treated with prednisone   Social History:  reports that she has been smoking cigarettes. She has a 4.00 pack-year smoking history. She has never used smokeless tobacco. She reports current alcohol use. She reports that she does not use drugs.  ROS: A complete review of systems was performed.  All systems are negative except for pertinent findings as noted. Review of Systems  Constitutional: Positive for malaise/fatigue.     Physical Exam:  Vital signs in last 24 hours: Temp:  [98.4 F (36.9 C)-102.3 F (39.1 C)] 102.3 F (39.1 C) (11/21 1249) Pulse Rate:  [93-148] 148 (11/21 1500) Resp:  [16-32] 31 (11/21 1500) BP: (112-141)/(53-88) 136/69 (11/21 1500) SpO2:  [93 %-100 %] 93 % (11/21 1500) Weight:  [61.2 kg] 61.2 kg (11/21 0259) General:  Alert and oriented, No acute distress HEENT: Normocephalic, atraumatic Cardiovascular: Regular rate and rhythm - tachy in 140's Lungs: Regular rate and effort Abdomen: Soft, nontender, nondistended, no abdominal masses Back: No CVA tenderness Extremities: No edema Neurologic: Grossly intact  Laboratory Data:  Results for orders placed or performed during the hospital encounter of 11/12/19 (from the past 24 hour(s))  Lipase, blood     Status: None   Collection Time: 11/12/19  3:20   AM  Result Value Ref Range   Lipase 35 11 - 51 U/L  Comprehensive metabolic panel     Status: Abnormal   Collection Time: 11/12/19  3:20 AM  Result Value Ref Range   Sodium 130 (L) 135 - 145 mmol/L   Potassium 3.6 3.5 - 5.1 mmol/L   Chloride 97 (L) 98 - 111 mmol/L   CO2 22 22 - 32 mmol/L   Glucose, Bld 171 (H) 70 - 99 mg/dL   BUN 15 6 - 20 mg/dL   Creatinine, Ser 0.82 0.44 - 1.00 mg/dL   Calcium 8.7 (L) 8.9 - 10.3 mg/dL   Total Protein 7.3 6.5 - 8.1 g/dL   Albumin 4.8 3.5 - 5.0 g/dL   AST 19 15 - 41 U/L   ALT 17 0 - 44 U/L   Alkaline  Phosphatase 48 38 - 126 U/L   Total Bilirubin 0.6 0.3 - 1.2 mg/dL   GFR calc non Af Amer >60 >60 mL/min   GFR calc Af Amer >60 >60 mL/min   Anion gap 11 5 - 15  CBC     Status: Abnormal   Collection Time: 11/12/19  3:20 AM  Result Value Ref Range   WBC 14.9 (H) 4.0 - 10.5 K/uL   RBC 4.11 3.87 - 5.11 MIL/uL   Hemoglobin 13.3 12.0 - 15.0 g/dL   HCT 40.7 36.0 - 46.0 %   MCV 99.0 80.0 - 100.0 fL   MCH 32.4 26.0 - 34.0 pg   MCHC 32.7 30.0 - 36.0 g/dL   RDW 12.8 11.5 - 15.5 %   Platelets 339 150 - 400 K/uL   nRBC 0.0 0.0 - 0.2 %  I-Stat beta hCG blood, ED     Status: None   Collection Time: 11/12/19  3:27 AM  Result Value Ref Range   I-stat hCG, quantitative <5.0 <5 mIU/mL   Comment 3          Urinalysis, Routine w reflex microscopic     Status: Abnormal   Collection Time: 11/12/19  4:52 AM  Result Value Ref Range   Color, Urine YELLOW YELLOW   APPearance CLOUDY (A) CLEAR   Specific Gravity, Urine 1.019 1.005 - 1.030   pH 6.0 5.0 - 8.0   Glucose, UA NEGATIVE NEGATIVE mg/dL   Hgb urine dipstick LARGE (A) NEGATIVE   Bilirubin Urine NEGATIVE NEGATIVE   Ketones, ur 5 (A) NEGATIVE mg/dL   Protein, ur 30 (A) NEGATIVE mg/dL   Nitrite NEGATIVE NEGATIVE   Leukocytes,Ua TRACE (A) NEGATIVE   RBC / HPF >50 (H) 0 - 5 RBC/hpf   WBC, UA 11-20 0 - 5 WBC/hpf   Bacteria, UA MANY (A) NONE SEEN   Squamous Epithelial / LPF 21-50 0 - 5   Mucus PRESENT    Ca Oxalate Crys, UA PRESENT   SARS Coronavirus 2 by RT PCR (hospital order, performed in [place] hospital lab) Nasopharyngeal Nasopharyngeal Swab     Status: None   Collection Time: 11/12/19 11:55 AM   Specimen: Nasopharyngeal Swab  Result Value Ref Range   SARS Coronavirus 2 NEGATIVE NEGATIVE   Recent Results (from the past 240 hour(s))  SARS Coronavirus 2 by RT PCR (hospital order, performed in [place] hospital lab) Nasopharyngeal Nasopharyngeal Swab     Status: None   Collection Time: 11/12/19 11:55 AM   Specimen: Nasopharyngeal  Swab  Result Value Ref Range Status   SARS Coronavirus 2 NEGATIVE NEGATIVE Final    Comment: (NOTE) If result  is NEGATIVE SARS-CoV-2 target nucleic acids are NOT DETECTED. The SARS-CoV-2 RNA is generally detectable in upper and lower  respiratory specimens during the acute phase of infection. The lowest  concentration of SARS-CoV-2 viral copies this assay can detect is 250  copies / mL. A negative result does not preclude SARS-CoV-2 infection  and should not be used as the sole basis for treatment or other  patient management decisions.  A negative result may occur with  improper specimen collection / handling, submission of specimen other  than nasopharyngeal swab, presence of viral mutation(s) within the  areas targeted by this assay, and inadequate number of viral copies  (<250 copies / mL). A negative result must be combined with clinical  observations, patient history, and epidemiological information. If result is POSITIVE SARS-CoV-2 target nucleic acids are DETECTED. The SARS-CoV-2 RNA is generally detectable in upper and lower  respiratory specimens dur ing the acute phase of infection.  Positive  results are indicative of active infection with SARS-CoV-2.  Clinical  correlation with patient history and other diagnostic information is  necessary to determine patient infection status.  Positive results do  not rule out bacterial infection or co-infection with other viruses. If result is PRESUMPTIVE POSTIVE SARS-CoV-2 nucleic acids MAY BE PRESENT.   A presumptive positive result was obtained on the submitted specimen  and confirmed on repeat testing.  While 2019 novel coronavirus  (SARS-CoV-2) nucleic acids may be present in the submitted sample  additional confirmatory testing may be necessary for epidemiological  and / or clinical management purposes  to differentiate between  SARS-CoV-2 and other Sarbecovirus currently known to infect humans.  If clinically indicated  additional testing with an alternate test  methodology [phone]) is advised. The SARS-CoV-2 RNA is generally  detectable in upper and lower respiratory sp ecimens during the acute  phase of infection. The expected result is Negative. Fact Sheet for Patients:  BoilerBrush.com.cy Fact Sheet for Healthcare Providers: https://pope.com/ This test is not yet approved or cleared by the [place] FDA and has been authorized for detection and/or diagnosis of SARS-CoV-2 by FDA under an Emergency Use Authorization (EUA).  This EUA will remain in effect (meaning this test can be used) for the duration of the COVID-19 declaration under Section 564(b)(1) of the Act, 21 U.S.C. section 360bbb-3(b)(1), unless the authorization is terminated or revoked sooner. Performed at [facility], [address] [place], [place] [address]    Creatinine: Recent Labs    11/12/19 0320  CREATININE 0.82    Impression/Assessment/plan:  I discussed with the patient the nature, potential benefits, risks and alternatives to cystoscopy with left retrograde pyelogram and left ureteral stent placement, including side effects of the proposed treatment, the likelihood of the patient achieving the goals of the procedure, and any potential problems that might occur during the procedure or recuperation.  We discussed the rationale for a staged procedure and we went over future shockwave lithotripsy or ureteroscopy for the renal stone.  She would like to do whichever is more efficient to have the stent removed soon as possible.  All questions answered. Patient elects to proceed with left ureteral stent.   Jerilee Field 11/12/2019, 3:37 PM

## 2019-11-12 NOTE — Anesthesia Procedure Notes (Signed)
Procedure Name: Intubation Date/Time: 11/12/2019 4:25 PM Performed by: Montel Clock, CRNA Pre-anesthesia Checklist: Patient identified, Emergency Drugs available, Suction available, Patient being monitored and Timeout performed Patient Re-evaluated:Patient Re-evaluated prior to induction Oxygen Delivery Method: Circle system utilized Preoxygenation: Pre-oxygenation with 100% oxygen Induction Type: IV induction and Rapid sequence Laryngoscope Size: Mac and 3 Grade View: Grade I Tube type: Oral Tube size: 7.0 mm Number of attempts: 1 Airway Equipment and Method: Stylet Placement Confirmation: ETT inserted through vocal cords under direct vision,  positive ETCO2 and breath sounds checked- equal and bilateral Secured at: 21 cm Tube secured with: Tape Dental Injury: Teeth and Oropharynx as per pre-operative assessment

## 2019-11-13 ENCOUNTER — Observation Stay (HOSPITAL_COMMUNITY): Payer: Self-pay

## 2019-11-13 LAB — CBC
HCT: 31.5 % — ABNORMAL LOW (ref 36.0–46.0)
Hemoglobin: 10.1 g/dL — ABNORMAL LOW (ref 12.0–15.0)
MCH: 32.8 pg (ref 26.0–34.0)
MCHC: 32.1 g/dL (ref 30.0–36.0)
MCV: 102.3 fL — ABNORMAL HIGH (ref 80.0–100.0)
Platelets: 161 10*3/uL (ref 150–400)
RBC: 3.08 MIL/uL — ABNORMAL LOW (ref 3.87–5.11)
RDW: 14 % (ref 11.5–15.5)
WBC: 30.9 10*3/uL — ABNORMAL HIGH (ref 4.0–10.5)
nRBC: 0 % (ref 0.0–0.2)

## 2019-11-13 LAB — BLOOD CULTURE ID PANEL (REFLEXED)

## 2019-11-13 LAB — COMPREHENSIVE METABOLIC PANEL
ALT: 15 U/L (ref 0–44)
AST: 19 U/L (ref 15–41)
Albumin: 2.9 g/dL — ABNORMAL LOW (ref 3.5–5.0)
Alkaline Phosphatase: 34 U/L — ABNORMAL LOW (ref 38–126)
Anion gap: 10 (ref 5–15)
BUN: 14 mg/dL (ref 6–20)
CO2: 16 mmol/L — ABNORMAL LOW (ref 22–32)
Calcium: 7.5 mg/dL — ABNORMAL LOW (ref 8.9–10.3)
Chloride: 112 mmol/L — ABNORMAL HIGH (ref 98–111)
Creatinine, Ser: 0.89 mg/dL (ref 0.44–1.00)
GFR calc Af Amer: >60 mL/min (ref >60)
GFR calc non Af Amer: >60 mL/min (ref >60)
Glucose, Bld: 108 mg/dL — ABNORMAL HIGH (ref 70–99)
Potassium: 3.8 mmol/L (ref 3.5–5.1)
Sodium: 138 mmol/L (ref 135–145)
Total Bilirubin: 0.5 mg/dL (ref 0.3–1.2)
Total Protein: 5.4 g/dL — ABNORMAL LOW (ref 6.5–8.1)

## 2019-11-13 LAB — HIV ANTIBODY (ROUTINE TESTING W REFLEX): HIV Screen 4th Generation wRfx: NONREACTIVE

## 2019-11-13 MED ORDER — SODIUM CHLORIDE 0.9 % IV SOLN
2.0000 g | INTRAVENOUS | Status: DC
  Administered 2019-11-13 – 2019-11-14 (×2): 2 g via INTRAVENOUS
  Filled 2019-11-13 (×2): qty 2

## 2019-11-13 MED ORDER — MORPHINE SULFATE (PF) 2 MG/ML IV SOLN
2.0000 mg | INTRAVENOUS | Status: DC | PRN
  Administered 2019-11-13 – 2019-11-14 (×5): 2 mg via INTRAVENOUS
  Filled 2019-11-13 (×5): qty 1

## 2019-11-13 NOTE — Evaluation (Signed)
Physical Therapy Evaluation-1x Patient Details Name: [name] MRN: [id number] DOB: 1986/11/03 Today's Date: 11/13/2019   History of Present Illness  33 year old female was admitted for nephrolithiasis.  She presented wtih L severe abdominal pain and vomiting.  Found to have L UPJ stone and is s/p cystoscopy and L ureteral stent.  PMH:  depression  Clinical Impression  On eval, pt was Mod Ind with mobility. She walked ~250 feet around the unit. Mild-mod pain from surgical procedure. Do not anticipate any further PT need. 1x eval. Will sign off.     Follow Up Recommendations No PT follow up    Equipment Recommendations  None recommended by PT    Recommendations for Other Services       Precautions / Restrictions Precautions Precautions: None Restrictions Weight Bearing Restrictions: No      Mobility  Bed Mobility Overal bed mobility: Modified Independent             General bed mobility comments: HOB raised; educated on sidelying<>sit at home for increased comfort  Transfers Overall transfer level: Modified independent                  Ambulation/Gait Ambulation/Gait assistance: Modified independent (Device/Increase time) Gait Distance (Feet): 250 Feet Assistive device: None Gait Pattern/deviations: Step-through pattern;Decreased stride length     General Gait Details: slow, guarded gait 2* discomfort. no lob  Stairs            Wheelchair Mobility    Modified Rankin (Stroke Patients Only)       Balance Overall balance assessment: No apparent balance deficits (not formally assessed)                                           Pertinent Vitals/Pain Pain Assessment: Faces Faces Pain Scale: Hurts little more Pain Location: surgical procedure site Pain Descriptors / Indicators: Discomfort;Sore Pain Intervention(s): Monitored during session;Limited activity within patient's tolerance    Home Living Family/patient  expects to be discharged to:: Private residence Living Arrangements: Spouse/significant other Available Help at Discharge: Family Type of Home: House Home Access: Stairs to enter   [employer] of Steps: 4 with a rail   Home Equipment: None      Prior Function Level of Independence: Independent         Comments: husband will assist; mother doesn't work and can help as needed. She has a 7 y o son also     Hand Dominance        Extremity/Trunk Assessment   Upper Extremity Assessment Upper Extremity Assessment: Defer to OT evaluation    Lower Extremity Assessment Lower Extremity Assessment: Overall WFL for tasks assessed    Cervical / Trunk Assessment Cervical / Trunk Assessment: Normal  Communication   Communication: No difficulties  Cognition Arousal/Alertness: Awake/alert Behavior During Therapy: WFL for tasks assessed/performed Overall Cognitive Status: Within Functional Limits for tasks assessed                                        General Comments      Exercises     Assessment/Plan    PT Assessment Patent does not need any further PT services  PT Problem List         PT Treatment Interventions  PT Goals (Current goals can be found in the Care Plan section)  Acute Rehab PT Goals Patient Stated Goal: less pain PT Goal Formulation: All assessment and education complete, DC therapy    Frequency     Barriers to discharge        Co-evaluation               AM-PAC PT "6 Clicks" Mobility  Outcome Measure Help needed turning from your back to your side while in a flat bed without using bedrails?: None Help needed moving from lying on your back to sitting on the side of a flat bed without using bedrails?: None Help needed moving to and from a bed to a chair (including a wheelchair)?: None Help needed standing up from a chair using your arms (e.g., wheelchair or bedside chair)?: None Help needed to walk in  hospital room?: A Little Help needed climbing 3-5 steps with a railing? : A Little 6 Click Score: 22    End of Session Equipment Utilized During Treatment: Gait belt Activity Tolerance: Patient tolerated treatment well Patient left: in bed;with call bell/phone within reach;with family/visitor present        Time: 5465-0354 PT Time Calculation (min) (ACUTE ONLY): 10 min   Charges:   PT Evaluation $PT Eval Low Complexity: [place], PT Acute Rehabilitation Services Pager: [phone] Office: [phone]

## 2019-11-13 NOTE — Progress Notes (Signed)
PHARMACY - PHYSICIAN COMMUNICATION CRITICAL VALUE ALERT - BLOOD CULTURE IDENTIFICATION (BCID)  [name] is an 33 y.o. female who presented to [facility] on 11/12/2019 with a chief complaint of abdominal pain.   Assessment: sepsis, left UPJ stone s/p cystoscopy with left ureteral stent placement   Name of physician (or Provider) Contacted: Jonny Ruiz, NP  Current antibiotics: Ceftriaxone 1g IV q24h  Changes to prescribed antibiotics recommended:  Increase Ceftriaxone to 2g IV q24h. Follow up cultures.   Results for orders placed or performed during the hospital encounter of 11/12/19  Blood Culture ID Panel (Reflexed) (Collected: 11/12/2019  2:17 PM)  Result Value Ref Range   Enterococcus species NOT DETECTED NOT DETECTED   Listeria monocytogenes NOT DETECTED NOT DETECTED   Staphylococcus species NOT DETECTED NOT DETECTED   Staphylococcus aureus (BCID) NOT DETECTED NOT DETECTED   Streptococcus species NOT DETECTED NOT DETECTED   Streptococcus agalactiae NOT DETECTED NOT DETECTED   Streptococcus pneumoniae NOT DETECTED NOT DETECTED   Streptococcus pyogenes NOT DETECTED NOT DETECTED   Acinetobacter baumannii NOT DETECTED NOT DETECTED   Enterobacteriaceae species DETECTED (A) NOT DETECTED   Enterobacter cloacae complex NOT DETECTED NOT DETECTED   Escherichia coli DETECTED (A) NOT DETECTED   Klebsiella oxytoca NOT DETECTED NOT DETECTED   Klebsiella pneumoniae NOT DETECTED NOT DETECTED   Proteus species NOT DETECTED NOT DETECTED   Serratia marcescens NOT DETECTED NOT DETECTED   Carbapenem resistance NOT DETECTED NOT DETECTED   Haemophilus influenzae NOT DETECTED NOT DETECTED   Neisseria meningitidis NOT DETECTED NOT DETECTED   Pseudomonas aeruginosa NOT DETECTED NOT DETECTED   Candida albicans NOT DETECTED NOT DETECTED   Candida glabrata NOT DETECTED NOT DETECTED   Candida krusei NOT DETECTED NOT DETECTED   Candida parapsilosis NOT DETECTED NOT DETECTED   Candida  tropicalis NOT DETECTED NOT DETECTED    [name] 11/13/2019  6:46 AM

## 2019-11-13 NOTE — Progress Notes (Signed)
1 Day Post-Op Subjective: Patient reports feeling better now. Glad to have foley out.   Objective: Vital signs in last 24 hours: Temp:  [98.1 F (36.7 C)-102.3 F (39.1 C)] 98.1 F (36.7 C) (11/22 1052) Pulse Rate:  [100-163] 105 (11/22 1052) Resp:  [14-32] 14 (11/22 1052) BP: (82-137)/(49-80) 105/70 (11/22 1052) SpO2:  [92 %-100 %] 100 % (11/22 1052) Weight:  [64.9 kg] 64.9 kg (11/22 0521)  Intake/Output from previous day: 11/21 0701 - 11/22 0700 In: 1899.7 [P.O.:1000; I.V.:800; IV Piggyback:99.7] Out: 950 [Urine:950] Intake/Output this shift: No intake/output data recorded.  Physical Exam:  NAD Watching TV   Lab Results: Recent Labs    11/12/19 0320 11/13/19 0524  HGB 13.3 10.1*  HCT 40.7 31.5*   BMET Recent Labs    11/12/19 0320 11/13/19 0524  NA 130* 138  K 3.6 3.8  CL 97* 112*  CO2 22 16*  GLUCOSE 171* 108*  BUN 15 14  CREATININE 0.82 0.89  CALCIUM 8.7* 7.5*   No results for input(s): LABPT, INR in the last 72 hours. No results for input(s): LABURIN in the last 72 hours. Results for orders placed or performed during the hospital encounter of 11/12/19  SARS Coronavirus 2 by RT PCR (hospital order, performed in [facility] hospital lab) Nasopharyngeal Nasopharyngeal Swab     Status: None   Collection Time: 11/12/19 11:55 AM   Specimen: Nasopharyngeal Swab  Result Value Ref Range Status   SARS Coronavirus 2 NEGATIVE NEGATIVE Final    Comment: (NOTE) If result is NEGATIVE SARS-CoV-2 target nucleic acids are NOT DETECTED. The SARS-CoV-2 RNA is generally detectable in upper and lower  respiratory specimens during the acute phase of infection. The lowest  concentration of SARS-CoV-2 viral copies this assay can detect is 250  copies / mL. A negative result does not preclude SARS-CoV-2 infection  and should not be used as the sole basis for treatment or other  patient management decisions.  A negative result may occur with  improper specimen collection /  handling, submission of specimen other  than nasopharyngeal swab, presence of viral mutation(s) within the  areas targeted by this assay, and inadequate number of viral copies  (<250 copies / mL). A negative result must be combined with clinical  observations, patient history, and epidemiological information. If result is POSITIVE SARS-CoV-2 target nucleic acids are DETECTED. The SARS-CoV-2 RNA is generally detectable in upper and lower  respiratory specimens dur ing the acute phase of infection.  Positive  results are indicative of active infection with SARS-CoV-2.  Clinical  correlation with patient history and other diagnostic information is  necessary to determine patient infection status.  Positive results do  not rule out bacterial infection or co-infection with other viruses. If result is PRESUMPTIVE POSTIVE SARS-CoV-2 nucleic acids MAY BE PRESENT.   A presumptive positive result was obtained on the submitted specimen  and confirmed on repeat testing.  While 2019 novel coronavirus  (SARS-CoV-2) nucleic acids may be present in the submitted sample  additional confirmatory testing may be necessary for epidemiological  and / or clinical management purposes  to differentiate between  SARS-CoV-2 and other Sarbecovirus currently known to infect humans.  If clinically indicated additional testing with an alternate test  methodology [phone]) is advised. The SARS-CoV-2 RNA is generally  detectable in upper and lower respiratory sp ecimens during the acute  phase of infection. The expected result is Negative. Fact Sheet for Patients:  BoilerBrush.com.cy Fact Sheet for Healthcare Providers: https://pope.com/ This test  is not yet approved or cleared by the [place] and has been authorized for detection and/or diagnosis of SARS-CoV-2 by FDA under an Emergency Use Authorization (EUA).  This EUA will remain in effect (meaning this  test can be used) for the duration of the COVID-19 declaration under Section 564(b)(1) of the Act, 21 U.S.C. section 360bbb-3(b)(1), unless the authorization is terminated or revoked sooner. Performed at [facility], [place] [address] [place], [place] [address]   Culture, blood (routine x 2)     Status: None (Preliminary result)   Collection Time: 11/12/19  2:17 PM   Specimen: BLOOD LEFT HAND  Result Value Ref Range Status   Specimen Description   Final    BLOOD LEFT HAND Performed at [facility], [place] [address], [place], [place] [address]    Special Requests   Final    BOTTLES DRAWN AEROBIC AND ANAEROBIC Blood Culture adequate volume Performed at [place] [address], [place], [place] [address]    Culture  Setup Time   Final    GRAM NEGATIVE RODS IN BOTH AEROBIC AND ANAEROBIC BOTTLES CRITICAL VALUE NOTED.  VALUE IS CONSISTENT WITH PREVIOUSLY REPORTED AND CALLED VALUE. Performed at [facility], [place] [address], [place], [place] [address]    Culture GRAM NEGATIVE RODS  Final   Report Status PENDING  Incomplete  Culture, blood (routine x 2)     Status: None (Preliminary result)   Collection Time: 11/12/19  2:17 PM   Specimen: BLOOD LEFT WRIST  Result Value Ref Range Status   Specimen Description   Final    BLOOD LEFT WRIST Performed at [facility], [address], [place], [place] [address]    Special Requests   Final    BOTTLES DRAWN AEROBIC AND ANAEROBIC Blood Culture adequate volume Performed at [place] [address] [place], [place] [address]    Culture  Setup Time   Final    GRAM NEGATIVE RODS IN BOTH AEROBIC AND ANAEROBIC BOTTLES CRITICAL RESULT CALLED TO, READ BACK BY AND VERIFIED WITH: Damian Leavell 0630 11/13/2019 Mena Goes Performed at [facility], [place] [address] [place], [place] [address]    Culture GRAM NEGATIVE RODS  Final   Report Status PENDING  Incomplete   Blood Culture ID Panel (Reflexed)     Status: Abnormal   Collection Time: 11/12/19  2:17 PM  Result Value Ref Range Status   Enterococcus species NOT DETECTED NOT DETECTED Final   Listeria monocytogenes NOT DETECTED NOT DETECTED Final   Staphylococcus species NOT DETECTED NOT DETECTED Final   Staphylococcus aureus (BCID) NOT DETECTED NOT DETECTED Final   Streptococcus species NOT DETECTED NOT DETECTED Final   Streptococcus agalactiae NOT DETECTED NOT DETECTED Final   Streptococcus pneumoniae NOT DETECTED NOT DETECTED Final   Streptococcus pyogenes NOT DETECTED NOT DETECTED Final   Acinetobacter baumannii NOT DETECTED NOT DETECTED Final   Enterobacteriaceae species DETECTED (A) NOT DETECTED Final    Comment: Enterobacteriaceae represent a large family of gram-negative bacteria, not a single organism. CRITICAL RESULT CALLED TO, READ BACK BY AND VERIFIED WITH: J. GADHIA,PHARMD 0630 11/13/2019 T. TYSOR    Enterobacter cloacae complex NOT DETECTED NOT DETECTED Final   Escherichia coli DETECTED (A) NOT DETECTED Final    Comment: CRITICAL RESULT CALLED TO, READ BACK BY AND VERIFIED WITH: J. GADHIA,PHARMD 0630 11/13/2019 T. TYSOR    Klebsiella oxytoca NOT DETECTED NOT DETECTED Final  Klebsiella pneumoniae NOT DETECTED NOT DETECTED Final   Proteus species NOT DETECTED NOT DETECTED Final   Serratia marcescens NOT DETECTED NOT DETECTED Final   Carbapenem resistance NOT DETECTED NOT DETECTED Final   Haemophilus influenzae NOT DETECTED NOT DETECTED Final   Neisseria meningitidis NOT DETECTED NOT DETECTED Final   Pseudomonas aeruginosa NOT DETECTED NOT DETECTED Final   Candida albicans NOT DETECTED NOT DETECTED Final   Candida glabrata NOT DETECTED NOT DETECTED Final   Candida krusei NOT DETECTED NOT DETECTED Final   Candida parapsilosis NOT DETECTED NOT DETECTED Final   Candida tropicalis NOT DETECTED NOT DETECTED Final    Comment: Performed at [facility], [address]  [place], [place] [address]  MRSA PCR Screening     Status: None [facility]  Collection Time: 11/12/19  3:48 PM   Specimen: Nasopharyngeal  Result Value Ref Range Status   MRSA by PCR NEGATIVE NEGATIVE Final    Comment:        The GeneXpert MRSA Assay (FDA approved for NASAL specimens only), is one component of a comprehensive MRSA colonization surveillance program. It is not intended to diagnose MRSA infection nor to guide or monitor treatment for MRSA infections. Performed at [facility], [address] [place], [place] [address]     Studies/Results: Ct Abdomen Pelvis W Contrast  Result Date: 11/12/2019 CLINICAL DATA:  Severe left flank pain. EXAM: CT ABDOMEN AND PELVIS WITH CONTRAST TECHNIQUE: Multidetector CT imaging of the abdomen and pelvis was performed using the standard protocol following bolus administration of intravenous contrast. CONTRAST:  100mL OMNIPAQUE IOHEXOL 300 MG/ML  SOLN COMPARISON:  04/06/2014 FINDINGS: Lower chest: Unremarkable. Hepatobiliary: No suspicious focal abnormality within the liver parenchyma. Tiny hypodensity in the dome of the left liver is too small to characterize but likely benign. There is no evidence for gallstones, gallbladder wall thickening, or pericholecystic fluid. No intrahepatic or extrahepatic biliary dilation. Pancreas: No focal mass lesion. No dilatation of the main duct. No intraparenchymal cyst. No peripancreatic edema. Spleen: No splenomegaly. No focal mass lesion. Adrenals/Urinary Tract: No adrenal nodule or mass. 9 mm low-density lesion upper pole right kidney is similar to prior and compatible with a cyst. There is mild to moderate left hydronephrosis with decreased perfusion to the left kidney. 2 mm nonobstructing stone noted lower pole left kidney. 6 x 6 x 9 mm stone is identified at the left UPJ with extensive left perinephric and peripelvic edema. No ureteral or bladder stones. Stomach/Bowel: Tiny hiatal hernia. Stomach  otherwise unremarkable. Duodenum is normally positioned as is the ligament of Treitz. No small bowel wall thickening. No small bowel dilatation. The terminal ileum is normal. The appendix is normal. No gross colonic mass. No colonic wall thickening. Diverticular changes noted in the left colon without diverticulitis. Vascular/Lymphatic: No abdominal aortic aneurysm. No abdominal lymphadenopathy No pelvic sidewall lymphadenopathy. Reproductive: The uterus is unremarkable.  There is no adnexal mass. Other: Trace free fluid in the cul-de-sac. Musculoskeletal: No worrisome lytic or sclerotic osseous abnormality. IMPRESSION: 1. 2 mm left lower pole nonobstructing stone with 6 x 6 x 9 mm left UPJ stone causing mild to moderate left hydronephrosis and obstructive uropathy. No other urinary stone disease. Electronically Signed   By: Kennith CenterEric  Mansell M.D.   On: 11/12/2019 09:36   Dg C-arm 1-60 Min-no Report  Result Date: 11/12/2019 Fluoroscopy was utilized by the requesting physician.  No radiographic interpretation.    Assessment/Plan: Left UPJ stone, sepsis, bacteremia s/p stent 11/22 - blood  pressures still soft. Appreciate excellent hospitalist care. I will check a KUB to plan stone removal (shock wave vs URS) in a few weeks.   Discussed with Dr. Ashley Royalty    LOS: 0 days   Jerilee Field 11/13/2019, 11:15 AM

## 2019-11-13 NOTE — Progress Notes (Signed)
PROGRESS NOTE    [name]  PZW:[id number] DOB: [birth year] DOA: 11/12/2019 PCP: Patient, No Pcp Per   Brief Narrative: 33 y.o. female with past medical history of GERD, alcohol abuse, history of kidney stones, depression, presented to the hospital with complaints of abdominal pain and vomiting.  Patient states that she was fine until she had sudden onset of severe left abdominal pain for 2 hours prior to presentation.  This was associated with nausea and 2-3 episodes of nonbloody bilious vomiting.  Patient was severe in intensity around 10/ 10 intensity.  Patient does have history of kidney stones in the past.  Denies any hematuria, urgency, frequency or dysuria.  Patient denies any sick contacts or recent travel.  ED Course: In the ED, patient noted to be tachycardic with a leukocytosis.  Urology was consulted and due to findings of tachycardia liver elevated white count medicine team was requested for observation in the hospital.  Subsequently, patient developed fever and remained tachycardic.   Assessment & Plan:   Principal Problem:   NEPHROLITHIASIS Active Problems:   Hyponatremia   Leukocytosis   Hydronephrosis with renal and ureteral calculus obstruction   Renal stone   #1 sepsis secondary to E. coli bacteremia secondary to E. coli UTI status post obstructive uropathy due to ureteral stone with moderate hydronephrosis and left ureteral stent placement 11/12/2019.  Patient was tachycardic and febrile with leukocytosis at the time of admission.  Sensitivities from the culture are pending.  Continue Rocephin. Blood pressure 105/70 Continue IV fluids and pain control   #2 severe leukocytosis white count today is 30.9 from 14.9.  Continue IV antibiotic  and follow-up the final sensitivity.  #3 mild hyponatremia resolved sodium 138.   Estimated body mass index is 27.95 kg/m as calculated from the following:   Height as of this encounter: 5' (1.524 m).   Weight as of  this encounter: 64.9 kg.  DVT prophylaxis: SCD  code Status: Full code Family Communication: None  disposition Plan: Pending clinical improvement   Consultants:   Urology  Procedures: Status post left ureteral stent 11/12/2019 Antimicrobials: Rocephin  Subjective: She is resting in bed complaining of severe pain not relieved by Toradol 2 mg of morphine given she seems to be comfortable with that.  Objective: Vitals:   11/13/19 0512 11/13/19 0521 11/13/19 0522 11/13/19 1052  BP: (!) 87/58  92/63 105/70  Pulse: (!) 107  (!) 104 (!) 105  Resp:   16 14  Temp:   98.7 F (37.1 C) 98.1 F (36.7 C)  TempSrc:    Oral  SpO2: 99%  99% 100%  Weight:  64.9 kg    Height:        Intake/Output Summary (Last 24 hours) at 11/13/2019 1323 Last data filed at 11/13/2019 0945 Gross per 24 hour  Intake 1899.73 ml  Output 1225 ml  Net 674.73 ml   Filed Weights   11/12/19 0259 11/13/19 0521  Weight: 61.2 kg 64.9 kg    Examination:  General exam: Appears calm and comfortable  Respiratory system: Clear to auscultation. Respiratory effort normal. Cardiovascular system: S1 & S2 heard, RRR. No JVD, murmurs, rubs, gallops or clicks. No pedal edema. Gastrointestinal system: Abdomen is nondistended, soft and nontender. No organomegaly or masses felt. Normal bowel sounds heard.  Left CVA tenderness Central nervous system: Alert and oriented. No focal neurological deficits. Extremities: Symmetric 5 x 5 power. Skin: No rashes, lesions or ulcers Psychiatry: Judgement and insight appear normal. Mood & affect appropriate.  Data Reviewed: I have personally reviewed following labs and imaging studies  CBC: Recent Labs  Lab 11/12/19 0320 11/13/19 0524  WBC 14.9* 30.9*  HGB 13.3 10.1*  HCT 40.7 31.5*  MCV 99.0 102.3*  PLT 339 161   Basic Metabolic Panel: Recent Labs  Lab 11/12/19 0320 11/13/19 0524  NA 130* 138  K 3.6 3.8  CL 97* 112*  CO2 22 16*  GLUCOSE 171* 108*  BUN 15 14   CREATININE 0.82 0.89  CALCIUM 8.7* 7.5*   GFR: Estimated Creatinine Clearance: 75.6 mL/min (by C-G formula based on SCr of 0.89 mg/dL). Liver Function Tests: Recent Labs  Lab 11/12/19 0320 11/13/19 0524  AST 19 19  ALT 17 15  ALKPHOS 48 34*  BILITOT 0.6 0.5  PROT 7.3 5.4*  ALBUMIN 4.8 2.9*   Recent Labs  Lab 11/12/19 0320  LIPASE 35   No results for input(s): AMMONIA in the last 168 hours. Coagulation Profile: No results for input(s): INR, PROTIME in the last 168 hours. Cardiac Enzymes: No results for input(s): CKTOTAL, CKMB, CKMBINDEX, TROPONINI in the last 168 hours. BNP (last 3 results) No results for input(s): PROBNP in the last 8760 hours. HbA1C: No results for input(s): HGBA1C in the last 72 hours. CBG: No results for input(s): GLUCAP in the last 168 hours. Lipid Profile: No results for input(s): CHOL, HDL, LDLCALC, TRIG, CHOLHDL, LDLDIRECT in the last 72 hours. Thyroid Function Tests: No results for input(s): TSH, T4TOTAL, FREET4, T3FREE, THYROIDAB in the last 72 hours. Anemia Panel: No results for input(s): VITAMINB12, FOLATE, FERRITIN, TIBC, IRON, RETICCTPCT in the last 72 hours. Sepsis Labs: No results for input(s): PROCALCITON, LATICACIDVEN in the last 168 hours.  Recent Results (from the past 240 hour(s))  Urine culture     Status: Abnormal (Preliminary result)   Collection Time: 11/12/19  4:52 AM   Specimen: Urine, Random  Result Value Ref Range Status   Specimen Description   Final    URINE, RANDOM Performed at [facility], [address] [place], [place] [address]    Special Requests   Final    NONE Performed at [facility], [address] [place], [place] [address]    Culture >=100,000 COLONIES/mL GRAM NEGATIVE RODS (A)  Final   Report Status PENDING  Incomplete  SARS Coronavirus 2 by RT PCR (hospital order, performed in [facility] hospital lab) Nasopharyngeal Nasopharyngeal Swab     Status: None    Collection Time: 11/12/19 11:55 AM   Specimen: Nasopharyngeal Swab  Result Value Ref Range Status   SARS Coronavirus 2 NEGATIVE NEGATIVE Final    Comment: (NOTE) If result is NEGATIVE SARS-CoV-2 target nucleic acids are NOT DETECTED. The SARS-CoV-2 RNA is generally detectable in upper and lower  respiratory specimens during the acute phase of infection. The lowest  concentration of SARS-CoV-2 viral copies this assay can detect is 250  copies / mL. A negative result does not preclude SARS-CoV-2 infection  and should not be used as the sole basis for treatment or other  patient management decisions.  A negative result may occur with  improper specimen collection / handling, submission of specimen other  than nasopharyngeal swab, presence of viral mutation(s) within the  areas targeted by this assay, and inadequate number of viral copies  (<250 copies / mL). A negative result must be combined with clinical  observations, patient history, and epidemiological information. If result is POSITIVE SARS-CoV-2 target nucleic acids are DETECTED. The SARS-CoV-2 RNA is  generally detectable in upper and lower  respiratory specimens dur ing the acute phase of infection.  Positive  results are indicative of active infection with SARS-CoV-2.  Clinical  correlation with patient history and other diagnostic information is  necessary to determine patient infection status.  Positive results do  not rule out bacterial infection or co-infection with other viruses. If result is PRESUMPTIVE POSTIVE SARS-CoV-2 nucleic acids MAY BE PRESENT.   A presumptive positive result was obtained on the submitted specimen  and confirmed on repeat testing.  While 2019 novel coronavirus  (SARS-CoV-2) nucleic acids may be present in the submitted sample  additional confirmatory testing may be necessary for epidemiological  and / or clinical management purposes  to differentiate between  SARS-CoV-2 and other Sarbecovirus  currently known to infect humans.  If clinically indicated additional testing with an alternate test  methodology [phone]([id number]) is advised. The SARS-CoV-2 RNA is generally  detectable in upper and lower respiratory sp ecimens during the acute  phase of infection. The expected result is Negative. Fact Sheet for Patients:  BoilerBrush.com.cyhttps://www.fda.gov/media/136312/download Fact Sheet for Healthcare Providers: https://pope.com/https://www.fda.gov/media/136313/download This test is not yet approved or cleared by the [place] FDA and has been authorized for detection and/or diagnosis of SARS-CoV-2 by FDA under an Emergency Use Authorization (EUA).  This EUA will remain in effect (meaning this test can be used) for the duration of the COVID-19 declaration under Section 564(b)(1) of the Act, 21 U.S.C. section 360bbb-3(b)(1), unless the authorization is terminated or revoked sooner. Performed at [facility], [address] [place], [place] [address]   Culture, blood (routine x 2)     Status: None (Preliminary result)   Collection Time: 11/12/19  2:17 PM   Specimen: BLOOD LEFT HAND  Result Value Ref Range Status   Specimen Description   Final    BLOOD LEFT HAND Performed at [facility], [address] [place], [place] [address]    Special Requests   Final    BOTTLES DRAWN AEROBIC AND ANAEROBIC Blood Culture adequate volume Performed at [facility], [address] [place], [place] [address]    Culture  Setup Time   Final    GRAM NEGATIVE RODS IN BOTH AEROBIC AND ANAEROBIC BOTTLES CRITICAL VALUE NOTED.  VALUE IS CONSISTENT WITH PREVIOUSLY REPORTED AND CALLED VALUE. Performed at [facility], [address] [place], [place] [address]    Culture GRAM NEGATIVE RODS  Final   Report Status PENDING  Incomplete  Culture, blood (routine x 2)     Status: None (Preliminary result)   Collection Time: 11/12/19  2:17 PM   Specimen: BLOOD LEFT WRIST  Result Value Ref  Range Status   Specimen Description   Final    BLOOD LEFT WRIST Performed at [facility], [address] [place], [place] [address]    Special Requests   Final    BOTTLES DRAWN AEROBIC AND ANAEROBIC Blood Culture adequate volume Performed at [facility], [address] [place], [place] [address]    Culture  Setup Time   Final    GRAM NEGATIVE RODS IN BOTH AEROBIC AND ANAEROBIC BOTTLES CRITICAL RESULT CALLED TO, READ BACK BY AND VERIFIED WITH: Thana AtesJ. GADHIA,PHARMD 0630 11/13/2019 Girtha Hake. TYSOR Performed at [facility], [address] [place], [place] [address]    Culture GRAM NEGATIVE RODS  Final   Report Status PENDING  Incomplete  Blood Culture ID Panel (Reflexed)  Status: Abnormal   Collection Time: 11/12/19  2:17 PM  Result Value Ref Range Status   Enterococcus species NOT DETECTED NOT DETECTED Final   Listeria monocytogenes NOT DETECTED NOT DETECTED Final   Staphylococcus species NOT DETECTED NOT DETECTED Final   Staphylococcus aureus (BCID) NOT DETECTED NOT DETECTED Final   Streptococcus species NOT DETECTED NOT DETECTED Final   Streptococcus agalactiae NOT DETECTED NOT DETECTED Final   Streptococcus pneumoniae NOT DETECTED NOT DETECTED Final   Streptococcus pyogenes NOT DETECTED NOT DETECTED Final   Acinetobacter baumannii NOT DETECTED NOT DETECTED Final   Enterobacteriaceae species DETECTED (A) NOT DETECTED Final    Comment: Enterobacteriaceae represent a large family of gram-negative bacteria, not a single organism. CRITICAL RESULT CALLED TO, READ BACK BY AND VERIFIED WITH: J. GADHIA,PHARMD 0630 11/13/2019 T. TYSOR    Enterobacter cloacae complex NOT DETECTED NOT DETECTED Final   Escherichia coli DETECTED (A) NOT DETECTED Final    Comment: CRITICAL RESULT CALLED TO, READ BACK BY AND VERIFIED WITH: J. GADHIA,PHARMD 0630 11/13/2019 T. TYSOR    Klebsiella oxytoca NOT DETECTED NOT DETECTED Final   Klebsiella pneumoniae NOT DETECTED NOT  DETECTED Final   Proteus species NOT DETECTED NOT DETECTED Final   Serratia marcescens NOT DETECTED NOT DETECTED Final   Carbapenem resistance NOT DETECTED NOT DETECTED Final   Haemophilus influenzae NOT DETECTED NOT DETECTED Final   Neisseria meningitidis NOT DETECTED NOT DETECTED Final   Pseudomonas aeruginosa NOT DETECTED NOT DETECTED Final   Candida albicans NOT DETECTED NOT DETECTED Final   Candida glabrata NOT DETECTED NOT DETECTED Final   Candida krusei NOT DETECTED NOT DETECTED Final   Candida parapsilosis NOT DETECTED NOT DETECTED Final   Candida tropicalis NOT DETECTED NOT DETECTED Final    Comment: Performed at [facility], [address] [place], [place] [address]  MRSA PCR Screening     Status: None   Collection Time: 11/12/19  3:48 PM   Specimen: Nasopharyngeal  Result Value Ref Range Status   MRSA by PCR NEGATIVE NEGATIVE Final    Comment:        The GeneXpert MRSA Assay (FDA approved for NASAL specimens only), is one component of a comprehensive MRSA colonization surveillance program. It is not intended to diagnose MRSA infection nor to guide or monitor treatment for MRSA infections. Performed at [facility], [address], [place], [place] [address]          Radiology Studies: Dg Abd 1 View  Result Date: 11/13/2019 CLINICAL DATA:  History of left nephrolithiasis with left ureteral stent placement yesterday. EXAM: ABDOMEN - 1 VIEW COMPARISON:  11/12/2019 CT abdomen/pelvis. 03/27/2014 abdominal radiographs FINDINGS: Well-positioned left nephroureteral stent with proximal pigtail portion overlying the expected location of the left renal collecting system and distal pigtail portion overlying the left bladder region. Faint 4 mm density overlying the lower left kidney. No radiopaque left ureteral stones along the stent. No radiopaque right renal or right ureteral stones. No radiopaque stones overlying the bladder. Numerous calcified  venous phleboliths in the deep pelvis bilaterally. No dilated small bowel loops. Minimal colonic stool. No evidence of pneumatosis or pneumoperitoneum. IMPRESSION: 1. Well-positioned left nephroureteral stent. 2. Faint 4 mm density overlying the lower left kidney, cannot exclude residual stone fragment. 3. Otherwise no radiopaque nephrolithiasis. Electronically Signed   By: Delbert Phenix M.D.   On: 11/13/2019 12:19   Ct Abdomen Pelvis W Contrast  Result Date: 11/12/2019 CLINICAL DATA:  Severe left flank pain. EXAM: CT  ABDOMEN AND PELVIS WITH CONTRAST TECHNIQUE: Multidetector CT imaging of the abdomen and pelvis was performed using the standard protocol following bolus administration of intravenous contrast. CONTRAST:  OMNIPAQUE IOHEXOL 300 MG/ML  SOLN COMPARISON:  04/06/2014 FINDINGS: Lower chest: Unremarkable. Hepatobiliary: No suspicious focal abnormality within the liver parenchyma. Tiny hypodensity in the dome of the left liver is too small to characterize but likely benign. There is no evidence for gallstones, gallbladder wall thickening, or pericholecystic fluid. No intrahepatic or extrahepatic biliary dilation. Pancreas: No focal mass lesion. No dilatation of the main duct. No intraparenchymal cyst. No peripancreatic edema. Spleen: No splenomegaly. No focal mass lesion. Adrenals/Urinary Tract: No adrenal nodule or mass. 9 mm low-density lesion upper pole right kidney is similar to prior and compatible with a cyst. There is mild to moderate left hydronephrosis with decreased perfusion to the left kidney. 2 mm nonobstructing stone noted lower pole left kidney. 6 x 6 x 9 mm stone is identified at the left UPJ with extensive left perinephric and peripelvic edema. No ureteral or bladder stones. Stomach/Bowel: Tiny hiatal hernia. Stomach otherwise unremarkable. Duodenum is normally positioned as is the ligament of Treitz. No small bowel wall thickening. No small bowel dilatation. The terminal ileum is  normal. The appendix is normal. No gross colonic mass. No colonic wall thickening. Diverticular changes noted in the left colon without diverticulitis. Vascular/Lymphatic: No abdominal aortic aneurysm. No abdominal lymphadenopathy No pelvic sidewall lymphadenopathy. Reproductive: The uterus is unremarkable.  There is no adnexal mass. Other: Trace free fluid in the cul-de-sac. Musculoskeletal: No worrisome lytic or sclerotic osseous abnormality. IMPRESSION: 1. 2 mm left lower pole nonobstructing stone with 6 x 6 x 9 mm left UPJ stone causing mild to moderate left hydronephrosis and obstructive uropathy. No other urinary stone disease. Electronically Signed   By: Kennith Center M.D.   On: 11/12/2019 09:36   Dg C-arm 1-60 Min-no Report  Result Date: 11/12/2019 Fluoroscopy was utilized by the requesting physician.  No radiographic interpretation.        Scheduled Meds:  Chlorhexidine Gluconate Cloth  6 each Topical Daily   docusate sodium  100 mg Oral BID   pantoprazole  40 mg Oral Daily   Continuous Infusions:  sodium chloride 150 mL/hr at 11/13/19 1259   cefTRIAXone (ROCEPHIN)  IV       LOS: 0 days     Alwyn Ren, MD [facility]  If 7PM-7AM, please contact night-coverage [email or web address] Password [email or web address] 11/13/2019, 1:23 PM

## 2019-11-13 NOTE — Evaluation (Signed)
Occupational Therapy Evaluation Patient Details Name: [name] MRN: [id number] DOB: [birth year] Today's Date: 11/13/2019    History of Present Illness 33 year old female was admitted for nephrolithiasis.  She presented wtih L severe abdominal pain and vomiting.  Found to have L UPJ stone and is s/p cystoscopy and L ureteral stent.  PMH:  depression   Clinical Impression   Pt was admitted for the above. At baseline, she is independent with adls and is only limited by pain for LB adls, for which she has assistance. She is steady, but moving slowly due to pain.  No further OT is needed at this time    Follow Up Recommendations  No OT follow up    Equipment Recommendations  None recommended by OT    Recommendations for Other Services       Precautions / Restrictions Precautions Precautions: None Restrictions Weight Bearing Restrictions: No      Mobility Bed Mobility Overal bed mobility: Modified Independent             General bed mobility comments: HOB raised; educated on sidelying<>sit at home for increased comfort  Transfers Overall transfer level: Independent                    Balance Overall balance assessment: No apparent balance deficits (not formally assessed)                                         ADL either performed or assessed with clinical judgement   ADL Overall ADL's : Needs assistance/impaired                                       General ADL Comments: pt is mostly mod I (extra time) for adls/toilet transfers with the exception of LB bathing and dressing. She needs mod A due to discomfort of stent. Family will assist as needed at home     Vision         Perception     Praxis      Pertinent Vitals/Pain Pain Assessment: Faces Faces Pain Scale: Hurts little more Pain Location: stent Pain Descriptors / Indicators: Discomfort;Sore Pain Intervention(s): Limited activity within patient's  tolerance;Monitored during session;Repositioned     Hand Dominance     Extremity/Trunk Assessment Upper Extremity Assessment Upper Extremity Assessment: Overall WFL for tasks assessed           Communication Communication Communication: No difficulties   Cognition Arousal/Alertness: Awake/alert Behavior During Therapy: WFL for tasks assessed/performed Overall Cognitive Status: Within Functional Limits for tasks assessed                                     General Comments       Exercises     Shoulder Instructions      Home Living Family/patient expects to be discharged to:: Private residence Living Arrangements: Spouse/significant other;Children Available Help at Discharge: Family Type of Home: House Home Access: Stairs to enter Secretary/administrator of Steps: 4 with a rail         Bathroom Shower/Tub: Producer, television/film/video: Standard     Home Equipment: None          Prior  Functioning/Environment Level of Independence: Independent        Comments: husband will assist; mother doesn't work and can help as needed. She has a 4 y o son also        OT Problem List:        OT Treatment/Interventions:      OT Goals(Current goals can be found in the care plan section) Acute Rehab OT Goals OT Goal Formulation: All assessment and education complete, DC therapy  OT Frequency:     Barriers to D/C:            Co-evaluation              AM-PAC OT "6 Clicks" Daily Activity     Outcome Measure Help from another person eating meals?: None Help from another person taking care of personal grooming?: None Help from another person toileting, which includes using toliet, bedpan, or urinal?: None Help from another person bathing (including washing, rinsing, drying)?: A Little Help from another person to put on and taking off regular upper body clothing?: None Help from another person to put on and taking off regular lower  body clothing?: A Lot 6 Click Score: 21   End of Session    Activity Tolerance: Patient tolerated treatment well Patient left: in bed;with call bell/phone within reach;with family/visitor present  OT Visit Diagnosis: Muscle weakness (generalized) (M62.81)                Time: 1660-6301 OT Time Calculation (min): 13 min Charges:  OT General Charges $OT Visit: 1 Visit OT Evaluation $OT Eval Low Complexity: [place], OTR/L Acute Rehabilitation Services [phone] [facility] pager [phone] office 11/13/2019  [place] 11/13/2019, 12:20 PM

## 2019-11-13 NOTE — Plan of Care (Signed)

## 2019-11-14 ENCOUNTER — Encounter (HOSPITAL_COMMUNITY): Payer: Self-pay | Admitting: Urology

## 2019-11-14 LAB — URINE CULTURE
Culture: >=100000 — AB
Culture: NO GROWTH

## 2019-11-14 LAB — CBC
HCT: 31.1 % — ABNORMAL LOW (ref 36.0–46.0)
Hemoglobin: 10.3 g/dL — ABNORMAL LOW (ref 12.0–15.0)
MCH: 33.4 pg (ref 26.0–34.0)
MCHC: 33.1 g/dL (ref 30.0–36.0)
MCV: 101 fL — ABNORMAL HIGH (ref 80.0–100.0)
Platelets: 148 10*3/uL — ABNORMAL LOW (ref 150–400)
RBC: 3.08 MIL/uL — ABNORMAL LOW (ref 3.87–5.11)
RDW: 14 % (ref 11.5–15.5)
WBC: 24.4 10*3/uL — ABNORMAL HIGH (ref 4.0–10.5)
nRBC: 0 % (ref 0.0–0.2)

## 2019-11-14 LAB — COMPREHENSIVE METABOLIC PANEL
ALT: 31 U/L (ref 0–44)
AST: 33 U/L (ref 15–41)
Albumin: 2.5 g/dL — ABNORMAL LOW (ref 3.5–5.0)
Alkaline Phosphatase: 51 U/L (ref 38–126)
Anion gap: 8 (ref 5–15)
BUN: 13 mg/dL (ref 6–20)
CO2: 15 mmol/L — ABNORMAL LOW (ref 22–32)
Calcium: 7.4 mg/dL — ABNORMAL LOW (ref 8.9–10.3)
Chloride: 116 mmol/L — ABNORMAL HIGH (ref 98–111)
Creatinine, Ser: 0.7 mg/dL (ref 0.44–1.00)
GFR calc Af Amer: >60 mL/min (ref >60)
GFR calc non Af Amer: >60 mL/min (ref >60)
Glucose, Bld: 91 mg/dL (ref 70–99)
Potassium: 3.6 mmol/L (ref 3.5–5.1)
Sodium: 139 mmol/L (ref 135–145)
Total Bilirubin: 0.3 mg/dL (ref 0.3–1.2)
Total Protein: 4.9 g/dL — ABNORMAL LOW (ref 6.5–8.1)

## 2019-11-14 MED ORDER — ONDANSETRON HCL 4 MG PO TABS: 4.0000 mg | ORAL_TABLET | Freq: Four times a day (QID) | ORAL | 0 refills | Status: DC | PRN

## 2019-11-14 MED ORDER — AMOXICILLIN-POT CLAVULANATE 875-125 MG PO TABS: 1.0000 | ORAL_TABLET | Freq: Two times a day (BID) | ORAL | 0 refills | Status: AC

## 2019-11-14 MED ORDER — TRAMADOL HCL 50 MG PO TABS: 50.0000 mg | ORAL_TABLET | Freq: Four times a day (QID) | ORAL | 0 refills | Status: AC | PRN

## 2019-11-14 MED ORDER — DOCUSATE SODIUM 100 MG PO CAPS: 100.0000 mg | ORAL_CAPSULE | Freq: Two times a day (BID) | ORAL | 0 refills | Status: DC

## 2019-11-14 NOTE — Discharge Summary (Signed)
Physician Discharge Summary  [name] GYI:[id number] DOB: February 27, 1986 DOA: 11/12/2019  PCP: Patient, No Pcp Per  Admit date: 11/12/2019 Discharge date: 11/14/2019  Admitted From:home Disposition:home  Recommendations for Outpatient Follow-up:  1. Follow up with PCP in 1-2 weeks 2. Please obtain BMP/CBC in one week 3. Follow up with dr eskridge Home Health:none Equipment/Devices:none  Discharge Condition:stable CODE STATUSfull Diet recommendation cardiac Brief/Interim Summary:33 y.o.femalewith past medical history of GERD, alcohol abuse, history of kidney stones, depression, presented to the hospital with complaints of abdominal pain and vomiting. Patient states that she was fine until she had sudden onset of severe left abdominal pain for 2 hours prior to presentation. This was associated with nausea and 2-3 episodes of nonbloody bilious vomiting. Patient was severe in intensity around 10/10 intensity. Patient does have history of kidney stones in the past. Denies any hematuria, urgency, frequency or dysuria. Patient denies any sick contacts or recent travel.  ED Course:In the ED, patient noted to be tachycardic with a leukocytosis. Urology was consulted and due to findings of tachycardia liver elevated white count medicine team was requested for observation in the hospital.Subsequently,patient developed fever and remained tachycardic Discharge Diagnoses:  Principal Problem:   NEPHROLITHIASIS Active Problems:   Hyponatremia   Leukocytosis   Hydronephrosis with renal and ureteral calculus obstruction   Renal stone    #1 sepsis secondary to E. coli bacteremia secondary to E. coli UTI status post obstructive uropathy due to ureteral stone with moderate hydronephrosis and left ureteral stent placement 11/12/2019.  Patient was tachycardic and febrile with leukocytosis at the time of admission. E coli pan sensitive.   #2 severe leukocytosis -improving .  #3  mild hyponatremia resolved sodium 139.   Estimated body mass index is 28.18 kg/m as calculated from the following:   Height as of this encounter: 5' (1.524 m).   Weight as of this encounter: 65.5 kg.  Discharge Instructions  Discharge Instructions    Call MD for:  difficulty breathing, headache or visual disturbances   Complete by: As directed    Call MD for:  persistant nausea and vomiting   Complete by: As directed    Call MD for:  severe uncontrolled pain   Complete by: As directed    Diet - low sodium heart healthy   Complete by: As directed    Increase activity slowly   Complete by: As directed      Allergies as of 11/14/2019      Reactions   Codeine Itching   Bactrim [sulfamethoxazole-trimethoprim] Hives, Rash      Medication List    TAKE these medications   ALPRAZolam 0.25 MG tablet Commonly known as: XANAX Take 0.25 mg by mouth 2 (two) times daily as needed for anxiety.   amoxicillin-clavulanate 875-125 MG tablet Commonly known as: Augmentin Take 1 tablet by mouth 2 (two) times daily for 10 days.   docusate sodium 100 MG capsule Commonly known as: COLACE Take 1 capsule (100 mg total) by mouth 2 (two) times daily.   ondansetron 4 MG tablet Commonly known as: ZOFRAN Take 1 tablet (4 mg total) by mouth every 6 (six) hours as needed for nausea.      Follow-up Information    Call Festus Aloe, MD.   Specialty: Urology Why: his office will call and make an appointment Contact information: [address] [place]  [address] [phone]          Allergies  Allergen Reactions  . Codeine Itching  . Bactrim [  Sulfamethoxazole-Trimethoprim] Hives and Rash    Consultations:  dr Mena Goes  Procedures/Studies: Dg Abd 1 View  Result Date: 11/13/2019 CLINICAL DATA:  History of left nephrolithiasis with left ureteral stent placement yesterday. EXAM: ABDOMEN - 1 VIEW COMPARISON:  11/12/2019 CT abdomen/pelvis. 03/27/2014 abdominal radiographs  FINDINGS: Well-positioned left nephroureteral stent with proximal pigtail portion overlying the expected location of the left renal collecting system and distal pigtail portion overlying the left bladder region. Faint 4 mm density overlying the lower left kidney. No radiopaque left ureteral stones along the stent. No radiopaque right renal or right ureteral stones. No radiopaque stones overlying the bladder. Numerous calcified venous phleboliths in the deep pelvis bilaterally. No dilated small bowel loops. Minimal colonic stool. No evidence of pneumatosis or pneumoperitoneum. IMPRESSION: 1. Well-positioned left nephroureteral stent. 2. Faint 4 mm density overlying the lower left kidney, cannot exclude residual stone fragment. 3. Otherwise no radiopaque nephrolithiasis. Electronically Signed   By: Delbert Phenix M.D.   On: 11/13/2019 12:19   Ct Abdomen Pelvis W Contrast  Result Date: 11/12/2019 CLINICAL DATA:  Severe left flank pain. EXAM: CT ABDOMEN AND PELVIS WITH CONTRAST TECHNIQUE: Multidetector CT imaging of the abdomen and pelvis was performed using the standard protocol following bolus administration of intravenous contrast. CONTRAST:  OMNIPAQUE IOHEXOL 300 MG/ML  SOLN COMPARISON:  04/06/2014 FINDINGS: Lower chest: Unremarkable. Hepatobiliary: No suspicious focal abnormality within the liver parenchyma. Tiny hypodensity in the dome of the left liver is too small to characterize but likely benign. There is no evidence for gallstones, gallbladder wall thickening, or pericholecystic fluid. No intrahepatic or extrahepatic biliary dilation. Pancreas: No focal mass lesion. No dilatation of the main duct. No intraparenchymal cyst. No peripancreatic edema. Spleen: No splenomegaly. No focal mass lesion. Adrenals/Urinary Tract: No adrenal nodule or mass. 9 mm low-density lesion upper pole right kidney is similar to prior and compatible with a cyst. There is mild to moderate left hydronephrosis with decreased  perfusion to the left kidney. 2 mm nonobstructing stone noted lower pole left kidney. 6 x 6 x 9 mm stone is identified at the left UPJ with extensive left perinephric and peripelvic edema. No ureteral or bladder stones. Stomach/Bowel: Tiny hiatal hernia. Stomach otherwise unremarkable. Duodenum is normally positioned as is the ligament of Treitz. No small bowel wall thickening. No small bowel dilatation. The terminal ileum is normal. The appendix is normal. No gross colonic mass. No colonic wall thickening. Diverticular changes noted in the left colon without diverticulitis. Vascular/Lymphatic: No abdominal aortic aneurysm. No abdominal lymphadenopathy No pelvic sidewall lymphadenopathy. Reproductive: The uterus is unremarkable.  There is no adnexal mass. Other: Trace free fluid in the cul-de-sac. Musculoskeletal: No worrisome lytic or sclerotic osseous abnormality. IMPRESSION: 1. 2 mm left lower pole nonobstructing stone with 6 x 6 x 9 mm left UPJ stone causing mild to moderate left hydronephrosis and obstructive uropathy. No other urinary stone disease. Electronically Signed   By: Kennith Center M.D.   On: 11/12/2019 09:36   Dg C-arm 1-60 Min-no Report  Result Date: 11/12/2019 Fluoroscopy was utilized by the requesting physician.  No radiographic interpretation.    (Echo, Carotid, EGD, Colonoscopy, ERCP)    Subjective:  Wants to go home   Discharge Exam: Vitals:   11/14/19 0459 11/14/19 1247  BP: (!) 124/94 (!) 136/99  Pulse: 98 76  Resp: 18 18  Temp: 98.4 F (36.9 C) 98.2 F (36.8 C)  SpO2: (!) 87% 92%   Vitals:   11/13/19 1606 11/13/19 2119 11/14/19 0459  11/14/19 1247  BP: 104/68 103/76 (!) 124/94 (!) 136/99  Pulse: (!) 101 97 98 76  Resp: 18 16 18 18   Temp: 98.8 F (37.1 C) 98.5 F (36.9 C) 98.4 F (36.9 C) 98.2 F (36.8 C)  TempSrc: Oral  Oral Oral  SpO2: 94% 95% (!) 87% 92%  Weight:   65.5 kg   Height:        General: Pt is alert, awake, not in acute  distress Cardiovascular: RRR, S1/S2 +, no rubs, no gallops Respiratory: CTA bilaterally, no wheezing, no rhonchi Abdominal: Soft, NT, ND, bowel sounds +left cva tender  Extremities: no edema, no cyanosis    The results of significant diagnostics from this hospitalization (including imaging, microbiology, ancillary and laboratory) are listed below for reference.     Microbiology: Recent Results (from the past 240 hour(s))  Urine culture     Status: Abnormal   Collection Time: 11/12/19  4:52 AM   Specimen: Urine, Random  Result Value Ref Range Status   Specimen Description   Final    URINE, RANDOM Performed at [facility], [address] [place], [place] [address]    Special Requests   Final    NONE Performed at [facility], [address] [place], [place] [address]    Culture >=100,000 COLONIES/mL ESCHERICHIA COLI (A)  Final   Report Status 11/14/2019 FINAL  Final   Organism ID, Bacteria ESCHERICHIA COLI (A)  Final      Susceptibility   Escherichia coli - MIC*    AMPICILLIN 8 SENSITIVE Sensitive     CEFAZOLIN <=4 SENSITIVE Sensitive     CEFTRIAXONE <=1 SENSITIVE Sensitive     CIPROFLOXACIN <=0.25 SENSITIVE Sensitive     GENTAMICIN <=1 SENSITIVE Sensitive     IMIPENEM <=0.25 SENSITIVE Sensitive     NITROFURANTOIN 32 SENSITIVE Sensitive     TRIMETH/SULFA <=20 SENSITIVE Sensitive     AMPICILLIN/SULBACTAM 4 SENSITIVE Sensitive     Extended ESBL NEGATIVE Sensitive     * >=100,000 COLONIES/mL ESCHERICHIA COLI  SARS Coronavirus 2 by RT PCR (hospital order, performed in [facility] hospital lab) Nasopharyngeal Nasopharyngeal Swab     Status: None   Collection Time: 11/12/19 11:55 AM   Specimen: Nasopharyngeal Swab  Result Value Ref Range Status   SARS Coronavirus 2 NEGATIVE NEGATIVE Final    Comment: (NOTE) If result is NEGATIVE SARS-CoV-2 target nucleic acids are NOT DETECTED. The SARS-CoV-2 RNA is generally detectable in upper and  lower  respiratory specimens during the acute phase of infection. The lowest  concentration of SARS-CoV-2 viral copies this assay can detect is 250  copies / mL. A negative result does not preclude SARS-CoV-2 infection  and should not be used as the sole basis for treatment or other  patient management decisions.  A negative result may occur with  improper specimen collection / handling, submission of specimen other  than nasopharyngeal swab, presence of viral mutation(s) within the  areas targeted by this assay, and inadequate number of viral copies  (<250 copies / mL). A negative result must be combined with clinical  observations, patient history, and epidemiological information. If result is POSITIVE SARS-CoV-2 target nucleic acids are DETECTED. The SARS-CoV-2 RNA is generally detectable in upper and lower  respiratory specimens dur ing the acute phase of infection.  Positive  results are indicative of active infection with SARS-CoV-2.  Clinical  correlation with patient history and other diagnostic information is  necessary to determine patient infection status.  Positive results do  not rule out bacterial infection or co-infection with other viruses. If result is PRESUMPTIVE POSTIVE SARS-CoV-2 nucleic acids MAY BE PRESENT.   A presumptive positive result was obtained on the submitted specimen  and confirmed on repeat testing.  While 2019 novel coronavirus  (SARS-CoV-2) nucleic acids may be present in the submitted sample  additional confirmatory testing may be necessary for epidemiological  and / or clinical management purposes  to differentiate between  SARS-CoV-2 and other Sarbecovirus currently known to infect humans.  If clinically indicated additional testing with an alternate test  methodology [phone]) is advised. The SARS-CoV-2 RNA is generally  detectable in upper and lower respiratory sp ecimens during the acute  phase of infection. The expected result is  Negative. Fact Sheet for Patients:  BoilerBrush.com.cy Fact Sheet for Healthcare Providers: https://pope.com/ This test is not yet approved or cleared by the [place] FDA and has been authorized for detection and/or diagnosis of SARS-CoV-2 by FDA under an Emergency Use Authorization (EUA).  This EUA will remain in effect (meaning this test can be used) for the duration of the COVID-19 declaration under Section 564(b)(1) of the Act, 21 U.S.C. section 360bbb-3(b)(1), unless the authorization is terminated or revoked sooner. Performed at [facility], [address], [place], [place] [address]   Culture, blood (routine x 2)     Status: None (Preliminary result)   Collection Time: 11/12/19  2:17 PM   Specimen: BLOOD LEFT HAND  Result Value Ref Range Status   Specimen Description   Final    BLOOD LEFT HAND Performed at [facility], [address], [place], [place] [address]    Special Requests   Final    BOTTLES DRAWN AEROBIC AND ANAEROBIC Blood Culture adequate volume Performed at [facility], [address] [place], [place] [address]    Culture  Setup Time   Final    GRAM NEGATIVE RODS IN BOTH AEROBIC AND ANAEROBIC BOTTLES CRITICAL VALUE NOTED.  VALUE IS CONSISTENT WITH PREVIOUSLY REPORTED AND CALLED VALUE. Performed at [facility], [address], [place], [place] [address]    Culture GRAM NEGATIVE RODS  Final   Report Status PENDING  Incomplete  Culture, blood (routine x 2)     Status: Abnormal (Preliminary result)   Collection Time: 11/12/19  2:17 PM   Specimen: BLOOD LEFT WRIST  Result Value Ref Range Status   Specimen Description   Final    BLOOD LEFT WRIST Performed at [facility], [address], [place], [place] [address]    Special Requests   Final    BOTTLES DRAWN AEROBIC AND ANAEROBIC Blood Culture adequate volume Performed at [facility], [address], [place], [place] [address]    Culture  Setup Time   Final    GRAM NEGATIVE RODS IN BOTH AEROBIC AND ANAEROBIC BOTTLES CRITICAL RESULT CALLED TO, READ BACK BY AND VERIFIED WITH: Thana Ates 0630 11/13/2019 Girtha Hake Performed at [facility], [address] [place], [place] [address]    Culture ESCHERICHIA COLI (A)  Final   Report Status PENDING  Incomplete  Blood Culture ID Panel (Reflexed)     Status: Abnormal   Collection Time: 11/12/19  2:17 PM  Result Value Ref Range Status   Enterococcus species NOT DETECTED NOT DETECTED Final   Listeria monocytogenes NOT DETECTED NOT DETECTED Final   Staphylococcus species NOT DETECTED NOT DETECTED Final   Staphylococcus  aureus (BCID) NOT DETECTED NOT DETECTED Final   Streptococcus species NOT DETECTED NOT DETECTED Final   Streptococcus agalactiae NOT DETECTED NOT DETECTED Final   Streptococcus pneumoniae NOT DETECTED NOT DETECTED Final   Streptococcus pyogenes NOT DETECTED NOT DETECTED Final   Acinetobacter baumannii NOT DETECTED NOT DETECTED Final   Enterobacteriaceae species DETECTED (A) NOT DETECTED Final    Comment: Enterobacteriaceae represent a large family of gram-negative bacteria, not a single organism. CRITICAL RESULT CALLED TO, READ BACK BY AND VERIFIED WITH: J. GADHIA,PHARMD 0630 11/13/2019 T. TYSOR    Enterobacter cloacae complex NOT DETECTED NOT DETECTED Final   Escherichia coli DETECTED (A) NOT DETECTED Final    Comment: CRITICAL RESULT CALLED TO, READ BACK BY AND VERIFIED WITH: J. GADHIA,PHARMD 0630 11/13/2019 T. TYSOR    Klebsiella oxytoca NOT DETECTED NOT DETECTED Final   Klebsiella pneumoniae NOT DETECTED NOT DETECTED Final   Proteus species NOT DETECTED NOT DETECTED Final   Serratia marcescens NOT DETECTED NOT DETECTED Final   Carbapenem resistance NOT DETECTED NOT DETECTED Final   Haemophilus influenzae NOT DETECTED NOT DETECTED Final   Neisseria meningitidis NOT DETECTED NOT  DETECTED Final   Pseudomonas aeruginosa NOT DETECTED NOT DETECTED Final   Candida albicans NOT DETECTED NOT DETECTED Final   Candida glabrata NOT DETECTED NOT DETECTED Final   Candida krusei NOT DETECTED NOT DETECTED Final   Candida parapsilosis NOT DETECTED NOT DETECTED Final   Candida tropicalis NOT DETECTED NOT DETECTED Final    Comment: Performed at [facility], [address] [place], [place] [address]  MRSA PCR Screening     Status: None   Collection Time: 11/12/19  3:48 PM   Specimen: Nasopharyngeal  Result Value Ref Range Status   MRSA by PCR NEGATIVE NEGATIVE Final    Comment:        The GeneXpert MRSA Assay (FDA approved for NASAL specimens only), is one component of a comprehensive MRSA colonization surveillance program. It is not intended to diagnose MRSA infection nor to guide or monitor treatment for MRSA infections. Performed at [facility], [address] [place], [place] [address]   Urine culture     Status: None   Collection Time: 11/12/19 11:00 PM   Specimen: Urine, Clean Catch  Result Value Ref Range Status   Specimen Description   Final    URINE, CLEAN CATCH Performed at [facility], [address] [place], [place] [address]    Special Requests   Final    NONE Performed at [facility], [address] [place], [place] [address]    Culture   Final    NO GROWTH Performed at [facility], [address] [place], [place] [address]    Report Status 11/14/2019 FINAL  Final     Labs: BNP (last 3 results) No results for input(s): BNP in the last 8760 hours. Basic Metabolic Panel: Recent Labs  Lab 11/12/19 0320 11/13/19 0524 11/14/19 0728  NA 130* 138 139  K 3.6 3.8 3.6  CL 97* 112* 116*  CO2 22 16* 15*  GLUCOSE 171* 108* 91  BUN 15 14 13   CREATININE 0.82 0.89 0.70  CALCIUM 8.7* 7.5* 7.4*   Liver Function Tests: Recent Labs  Lab 11/12/19 0320 11/13/19 0524  11/14/19 0728  AST 19 19 33  ALT 17 15 31   ALKPHOS 48 34* 51  BILITOT 0.6 0.5 0.3  PROT 7.3 5.4* 4.9*  ALBUMIN 4.8 2.9* 2.5*  Recent Labs  Lab 11/12/19 0320  LIPASE 35   No results for input(s): AMMONIA in the last 168 hours. CBC: Recent Labs  Lab 11/12/19 0320 11/13/19 0524 11/14/19 0728  WBC 14.9* 30.9* 24.4*  HGB 13.3 10.1* 10.3*  HCT 40.7 31.5* 31.1*  MCV 99.0 102.3* 101.0*  PLT 339 161 148*   Cardiac Enzymes: No results for input(s): CKTOTAL, CKMB, CKMBINDEX, TROPONINI in the last 168 hours. BNP: Invalid input(s): POCBNP CBG: No results for input(s): GLUCAP in the last 168 hours. D-Dimer No results for input(s): DDIMER in the last 72 hours. Hgb A1c No results for input(s): HGBA1C in the last 72 hours. Lipid Profile No results for input(s): CHOL, HDL, LDLCALC, TRIG, CHOLHDL, LDLDIRECT in the last 72 hours. Thyroid function studies No results for input(s): TSH, T4TOTAL, T3FREE, THYROIDAB in the last 72 hours.  Invalid input(s): FREET3 Anemia work up No results for input(s): VITAMINB12, FOLATE, FERRITIN, TIBC, IRON, RETICCTPCT in the last 72 hours. Urinalysis    Component Value Date/Time   COLORURINE YELLOW 11/12/2019 0452   APPEARANCEUR CLOUDY (A) 11/12/2019 0452   LABSPEC 1.019 11/12/2019 0452   PHURINE 6.0 11/12/2019 0452   GLUCOSEU NEGATIVE 11/12/2019 0452   HGBUR LARGE (A) 11/12/2019 0452   HGBUR negative 03/21/2008 1502   BILIRUBINUR NEGATIVE 11/12/2019 0452   BILIRUBINUR negative 11/03/2016 1717   BILIRUBINUR neg. 07/21/2014 1147   KETONESUR 5 (A) 11/12/2019 0452   PROTEINUR 30 (A) 11/12/2019 0452   UROBILINOGEN 0.2 11/03/2016 1717   UROBILINOGEN 0.2 04/21/2015 1623   NITRITE NEGATIVE 11/12/2019 0452   LEUKOCYTESUR TRACE (A) 11/12/2019 0452   Sepsis Labs Invalid input(s): PROCALCITONIN,  WBC,  LACTICIDVEN Microbiology Recent Results (from the past 240 hour(s))  Urine culture     Status: Abnormal   Collection Time: 11/12/19  4:52 AM    Specimen: Urine, Random  Result Value Ref Range Status   Specimen Description   Final    URINE, RANDOM Performed at [facility], [address], [place], [place] [address]    Special Requests   Final    NONE Performed at [facility], [address] [place], [place] [address]    Culture >=100,000 COLONIES/mL ESCHERICHIA COLI (A)  Final   Report Status 11/14/2019 FINAL  Final   Organism ID, Bacteria ESCHERICHIA COLI (A)  Final      Susceptibility   Escherichia coli - MIC*    AMPICILLIN 8 SENSITIVE Sensitive     CEFAZOLIN <=4 SENSITIVE Sensitive     CEFTRIAXONE <=1 SENSITIVE Sensitive     CIPROFLOXACIN <=0.25 SENSITIVE Sensitive     GENTAMICIN <=1 SENSITIVE Sensitive     IMIPENEM <=0.25 SENSITIVE Sensitive     NITROFURANTOIN 32 SENSITIVE Sensitive     TRIMETH/SULFA <=20 SENSITIVE Sensitive     AMPICILLIN/SULBACTAM 4 SENSITIVE Sensitive     Extended ESBL NEGATIVE Sensitive     * >=100,000 COLONIES/mL ESCHERICHIA COLI  SARS Coronavirus 2 by RT PCR (hospital order, performed in [facility] hospital lab) Nasopharyngeal Nasopharyngeal Swab     Status: None   Collection Time: 11/12/19 11:55 AM   Specimen: Nasopharyngeal Swab  Result Value Ref Range Status   SARS Coronavirus 2 NEGATIVE NEGATIVE Final    Comment: (NOTE) If result is NEGATIVE SARS-CoV-2 target nucleic acids are NOT DETECTED. The SARS-CoV-2 RNA is generally detectable in upper and lower  respiratory specimens during the acute phase of infection. The lowest  concentration of SARS-CoV-2 viral copies this assay can detect is  250  copies / mL. A negative result does not preclude SARS-CoV-2 infection  and should not be used as the sole basis for treatment or other  patient management decisions.  A negative result may occur with  improper specimen collection / handling, submission of specimen other  than nasopharyngeal swab, presence of viral mutation(s) within the  areas targeted by  this assay, and inadequate number of viral copies  (<250 copies / mL). A negative result must be combined with clinical  observations, patient history, and epidemiological information. If result is POSITIVE SARS-CoV-2 target nucleic acids are DETECTED. The SARS-CoV-2 RNA is generally detectable in upper and lower  respiratory specimens dur ing the acute phase of infection.  Positive  results are indicative of active infection with SARS-CoV-2.  Clinical  correlation with patient history and other diagnostic information is  necessary to determine patient infection status.  Positive results do  not rule out bacterial infection or co-infection with other viruses. If result is PRESUMPTIVE POSTIVE SARS-CoV-2 nucleic acids MAY BE PRESENT.   A presumptive positive result was obtained on the submitted specimen  and confirmed on repeat testing.  While 2019 novel coronavirus  (SARS-CoV-2) nucleic acids may be present in the submitted sample  additional confirmatory testing may be necessary for epidemiological  and / or clinical management purposes  to differentiate between  SARS-CoV-2 and other Sarbecovirus currently known to infect humans.  If clinically indicated additional testing with an alternate test  methodology [phone]) is advised. The SARS-CoV-2 RNA is generally  detectable in upper and lower respiratory sp ecimens during the acute  phase of infection. The expected result is Negative. Fact Sheet for Patients:  BoilerBrush.com.cy Fact Sheet for Healthcare Providers: https://pope.com/ This test is not yet approved or cleared by the [place] FDA and has been authorized for detection and/or diagnosis of SARS-CoV-2 by FDA under an Emergency Use Authorization (EUA).  This EUA will remain in effect (meaning this test can be used) for the duration of the COVID-19 declaration under Section 564(b)(1) of the Act, 21 U.S.C. section  360bbb-3(b)(1), unless the authorization is terminated or revoked sooner. Performed at [facility], [address], [place], [place] [address]   Culture, blood (routine x 2)     Status: None (Preliminary result)   Collection Time: 11/12/19  2:17 PM   Specimen: BLOOD LEFT HAND  Result Value Ref Range Status   Specimen Description   Final    BLOOD LEFT HAND Performed at [facility], [address], [place], [place] [address]    Special Requests   Final    BOTTLES DRAWN AEROBIC AND ANAEROBIC Blood Culture adequate volume Performed at [facility], [address], [place], [place] [address]    Culture  Setup Time   Final    GRAM NEGATIVE RODS IN BOTH AEROBIC AND ANAEROBIC BOTTLES CRITICAL VALUE NOTED.  VALUE IS CONSISTENT WITH PREVIOUSLY REPORTED AND CALLED VALUE. Performed at [facility], [address], [place], [place] [address]    Culture GRAM NEGATIVE RODS  Final   Report Status PENDING  Incomplete  Culture, blood (routine x 2)     Status: Abnormal (Preliminary result)   Collection Time: 11/12/19  2:17 PM   Specimen: BLOOD LEFT WRIST  Result Value Ref Range Status   Specimen Description   Final    BLOOD LEFT WRIST Performed at [facility], [address] [place], [place] [address]    Special Requests  Final    BOTTLES DRAWN AEROBIC AND ANAEROBIC Blood Culture adequate volume Performed at [facility], [address] [place], [place] [address]    Culture  Setup Time   Final    GRAM NEGATIVE RODS IN BOTH AEROBIC AND ANAEROBIC BOTTLES CRITICAL RESULT CALLED TO, READ BACK BY AND VERIFIED WITH: Thana Ates 0630 11/13/2019 Girtha Hake Performed at [facility], [address], [place], [place] [address]    Culture ESCHERICHIA COLI (A)  Final   Report Status PENDING  Incomplete  Blood Culture ID Panel (Reflexed)     Status: Abnormal   Collection Time: 11/12/19  2:17 PM  Result Value Ref  Range Status   Enterococcus species NOT DETECTED NOT DETECTED Final   Listeria monocytogenes NOT DETECTED NOT DETECTED Final   Staphylococcus species NOT DETECTED NOT DETECTED Final   Staphylococcus aureus (BCID) NOT DETECTED NOT DETECTED Final   Streptococcus species NOT DETECTED NOT DETECTED Final   Streptococcus agalactiae NOT DETECTED NOT DETECTED Final   Streptococcus pneumoniae NOT DETECTED NOT DETECTED Final   Streptococcus pyogenes NOT DETECTED NOT DETECTED Final   Acinetobacter baumannii NOT DETECTED NOT DETECTED Final   Enterobacteriaceae species DETECTED (A) NOT DETECTED Final    Comment: Enterobacteriaceae represent a large family of gram-negative bacteria, not a single organism. CRITICAL RESULT CALLED TO, READ BACK BY AND VERIFIED WITH: J. GADHIA,PHARMD 0630 11/13/2019 T. TYSOR    Enterobacter cloacae complex NOT DETECTED NOT DETECTED Final   Escherichia coli DETECTED (A) NOT DETECTED Final    Comment: CRITICAL RESULT CALLED TO, READ BACK BY AND VERIFIED WITH: J. GADHIA,PHARMD 0630 11/13/2019 T. TYSOR    Klebsiella oxytoca NOT DETECTED NOT DETECTED Final   Klebsiella pneumoniae NOT DETECTED NOT DETECTED Final   Proteus species NOT DETECTED NOT DETECTED Final   Serratia marcescens NOT DETECTED NOT DETECTED Final   Carbapenem resistance NOT DETECTED NOT DETECTED Final   Haemophilus influenzae NOT DETECTED NOT DETECTED Final   Neisseria meningitidis NOT DETECTED NOT DETECTED Final   Pseudomonas aeruginosa NOT DETECTED NOT DETECTED Final   Candida albicans NOT DETECTED NOT DETECTED Final   Candida glabrata NOT DETECTED NOT DETECTED Final   Candida krusei NOT DETECTED NOT DETECTED Final   Candida parapsilosis NOT DETECTED NOT DETECTED Final   Candida tropicalis NOT DETECTED NOT DETECTED Final    Comment: Performed at [facility], [address], [place], [place] [address]  MRSA PCR Screening     Status: None   Collection Time: 11/12/19  3:48 PM   Specimen:  Nasopharyngeal  Result Value Ref Range Status   MRSA by PCR NEGATIVE NEGATIVE Final    Comment:        The GeneXpert MRSA Assay (FDA approved for NASAL specimens only), is one component of a comprehensive MRSA colonization surveillance program. It is not intended to diagnose MRSA infection nor to guide or monitor treatment for MRSA infections. Performed at [facility], [address] [place], [place] [address]   Urine culture     Status: None   Collection Time: 11/12/19 11:00 PM   Specimen: Urine, Clean Catch  Result Value Ref Range Status   Specimen Description   Final    URINE, CLEAN CATCH Performed at [facility], [address], [place], [place] [address]    Special Requests   Final    NONE Performed at [facility], [address], [place], [place] [address]    Culture  Final    NO GROWTH Performed at [facility], [address], [place], [place] [address]    Report Status 11/14/2019 FINAL  Final     Time coordinating discharge: Over 36 minutes  SIGNED:   Alwyn Ren, MD  [facility] 11/14/2019, 3:02 PM Pager   If 7PM-7AM, please contact night-coverage [email or web address] Password [email or web address]

## 2019-11-14 NOTE — Plan of Care (Signed)
  Problem: Health Behavior/Discharge Planning: Goal: Ability to manage health-related needs will improve Outcome: Progressing   Problem: Clinical Measurements: Goal: Cardiovascular complication will be avoided Outcome: Completed/Met   Problem: Activity: Goal: Risk for activity intolerance will decrease Outcome: Completed/Met   Problem: Coping: Goal: Level of anxiety will decrease Outcome: Completed/Met   Problem: Pain Managment: Goal: General experience of comfort will improve Outcome: Completed/Met   Problem: Safety: Goal: Ability to remain free from injury will improve Outcome: Completed/Met

## 2019-11-14 NOTE — Anesthesia Postprocedure Evaluation (Signed)
Anesthesia Post Note  Patient: [name]  Procedure(s) Performed: CYSTOSCOPY WITH RETROGRADE PYELOGRAM/URETERAL STENT PLACEMENT (Left Ureter)     Patient location during evaluation: PACU Anesthesia Type: General Level of consciousness: awake and alert Pain management: pain level controlled Vital Signs Assessment: post-procedure vital signs reviewed and stable Respiratory status: spontaneous breathing, nonlabored ventilation, respiratory function stable and patient connected to nasal cannula oxygen Cardiovascular status: blood pressure returned to baseline and stable Postop Assessment: no apparent nausea or vomiting Anesthetic complications: no    Last Vitals:  Vitals:   11/13/19 2119 11/14/19 0459  BP: 103/76 (!) 124/94  Pulse: 97 98  Resp: 16 18  Temp: 36.9 C 36.9 C  SpO2: 95% (!) 87%    Last Pain:  Vitals:   11/14/19 0848  TempSrc:   PainSc: 5                  Wilbern Pennypacker L Brittiany Wiehe

## 2019-11-14 NOTE — Progress Notes (Signed)
Report received from A. Abernathy,RN.  No change in assessment. [name]

## 2019-11-15 LAB — CULTURE, BLOOD (ROUTINE X 2)
Special Requests: ADEQUATE
Special Requests: ADEQUATE

## 2019-11-16 ENCOUNTER — Telehealth: Payer: Self-pay

## 2019-11-16 NOTE — Telephone Encounter (Signed)
Agree with that advisement  Or urgent care Thanks

## 2019-11-16 NOTE — Telephone Encounter (Signed)
Pt recently had a stent in her kidney and today has blister like and sores around anus and the vaginal area seems swollen. Pt spoke with surgeon and was advised to get OTC preparation H. Pt and pts mom on phone and wants appt. appt offered but pt cannot get to office on time and was advised to contact surgeon office again or go to UC or ED. Pt voiced understanding.

## 2019-11-22 ENCOUNTER — Other Ambulatory Visit: Payer: Self-pay | Admitting: Urology

## 2019-11-23 ENCOUNTER — Other Ambulatory Visit: Payer: Self-pay

## 2019-11-23 ENCOUNTER — Encounter (HOSPITAL_BASED_OUTPATIENT_CLINIC_OR_DEPARTMENT_OTHER): Payer: Self-pay | Admitting: *Deleted

## 2019-11-23 NOTE — Progress Notes (Signed)
Spoke w/ via phone for pre-op interview---[facility] needs dos----   Urine poct           Lab results------ COVID test ------11-25-2019 Arrive at -------1130 NPO after ------midnight Medications to take morning of surgery -----none Diabetic medication -----n/a Patient Special Instructions ----- Pre-Op special Istructions ----- Patient verbalized understanding of instructions that were given at this phone interview. Patient denies shortness of breath, chest pain, fever, cough a this phone interview.

## 2019-11-25 ENCOUNTER — Other Ambulatory Visit
Admission: RE | Admit: 2019-11-25 | Discharge: 2019-11-25 | Disposition: A | Discharge status: Home / Self Care | Payer: PRIVATE HEALTH INSURANCE | Source: Ambulatory Visit | Attending: Urology | Admitting: Urology

## 2019-11-25 ENCOUNTER — Other Ambulatory Visit: Payer: Self-pay

## 2019-11-25 DIAGNOSIS — Z20828 Contact with and (suspected) exposure to other viral communicable diseases: Secondary | ICD-10-CM | POA: Insufficient documentation

## 2019-11-25 DIAGNOSIS — Z01812 Encounter for preprocedural laboratory examination: Secondary | ICD-10-CM | POA: Diagnosis present

## 2019-11-26 LAB — SARS CORONAVIRUS 2 (TAT 6-24 HRS): SARS Coronavirus 2: NEGATIVE

## 2019-11-29 ENCOUNTER — Encounter (HOSPITAL_BASED_OUTPATIENT_CLINIC_OR_DEPARTMENT_OTHER)
Admission: RE | Disposition: A | Discharge status: Home / Self Care | Payer: Self-pay | Source: Home / Self Care | Attending: Urology

## 2019-11-29 ENCOUNTER — Encounter (HOSPITAL_BASED_OUTPATIENT_CLINIC_OR_DEPARTMENT_OTHER): Payer: Self-pay | Admitting: *Deleted

## 2019-11-29 ENCOUNTER — Ambulatory Visit (HOSPITAL_BASED_OUTPATIENT_CLINIC_OR_DEPARTMENT_OTHER)
Admission: RE | Admit: 2019-11-29 | Discharge: 2019-11-29 | Disposition: A | Discharge status: Home / Self Care | Payer: Self-pay | Attending: Urology | Admitting: Urology

## 2019-11-29 ENCOUNTER — Ambulatory Visit (HOSPITAL_BASED_OUTPATIENT_CLINIC_OR_DEPARTMENT_OTHER): Payer: Self-pay | Admitting: Certified Registered Nurse Anesthetist

## 2019-11-29 ENCOUNTER — Other Ambulatory Visit: Payer: Self-pay

## 2019-11-29 DIAGNOSIS — N200 Calculus of kidney: Secondary | ICD-10-CM

## 2019-11-29 DIAGNOSIS — Z8249 Family history of ischemic heart disease and other diseases of the circulatory system: Secondary | ICD-10-CM | POA: Insufficient documentation

## 2019-11-29 DIAGNOSIS — K219 Gastro-esophageal reflux disease without esophagitis: Secondary | ICD-10-CM | POA: Insufficient documentation

## 2019-11-29 DIAGNOSIS — F419 Anxiety disorder, unspecified: Secondary | ICD-10-CM | POA: Insufficient documentation

## 2019-11-29 DIAGNOSIS — M1990 Unspecified osteoarthritis, unspecified site: Secondary | ICD-10-CM | POA: Insufficient documentation

## 2019-11-29 DIAGNOSIS — N132 Hydronephrosis with renal and ureteral calculous obstruction: Secondary | ICD-10-CM | POA: Insufficient documentation

## 2019-11-29 DIAGNOSIS — R519 Headache, unspecified: Secondary | ICD-10-CM | POA: Insufficient documentation

## 2019-11-29 DIAGNOSIS — Z8379 Family history of other diseases of the digestive system: Secondary | ICD-10-CM | POA: Insufficient documentation

## 2019-11-29 DIAGNOSIS — Z885 Allergy status to narcotic agent status: Secondary | ICD-10-CM | POA: Insufficient documentation

## 2019-11-29 DIAGNOSIS — Z87442 Personal history of urinary calculi: Secondary | ICD-10-CM | POA: Insufficient documentation

## 2019-11-29 DIAGNOSIS — F17210 Nicotine dependence, cigarettes, uncomplicated: Secondary | ICD-10-CM | POA: Insufficient documentation

## 2019-11-29 DIAGNOSIS — Z809 Family history of malignant neoplasm, unspecified: Secondary | ICD-10-CM | POA: Insufficient documentation

## 2019-11-29 DIAGNOSIS — Z881 Allergy status to other antibiotic agents status: Secondary | ICD-10-CM | POA: Insufficient documentation

## 2019-11-29 HISTORY — DX: Headache, unspecified: R51.9

## 2019-11-29 HISTORY — DX: Anxiety disorder, unspecified: F41.9

## 2019-11-29 HISTORY — PX: URETEROSCOPY WITH HOLMIUM LASER LITHOTRIPSY: SHX6645

## 2019-11-29 LAB — POCT PREGNANCY, URINE: Preg Test, Ur: NEGATIVE

## 2019-11-29 SURGERY — URETEROSCOPY, WITH LITHOTRIPSY USING HOLMIUM LASER
Anesthesia: General | Site: Pelvis | Laterality: Left

## 2019-11-29 MED ORDER — ONDANSETRON HCL 4 MG/2ML IJ SOLN
INTRAMUSCULAR | Status: DC | PRN
  Administered 2019-11-29: 4 mg via INTRAVENOUS

## 2019-11-29 MED ORDER — NITROFURANTOIN MONOHYD MACRO 100 MG PO CAPS: 100.0000 mg | ORAL_CAPSULE | Freq: Every day | ORAL | 0 refills | Status: AC

## 2019-11-29 MED ORDER — CEFAZOLIN SODIUM-DEXTROSE 2-4 GM/100ML-% IV SOLN
2.0000 g | Freq: Once | INTRAVENOUS | Status: AC
  Administered 2019-11-29: 2 g via INTRAVENOUS
  Filled 2019-11-29: qty 100

## 2019-11-29 MED ORDER — CEFAZOLIN SODIUM-DEXTROSE 2-4 GM/100ML-% IV SOLN
INTRAVENOUS | Status: AC
  Filled 2019-11-29: qty 100

## 2019-11-29 MED ORDER — TRAMADOL HCL 50 MG PO TABS
ORAL_TABLET | ORAL | Status: AC
  Filled 2019-11-29: qty 1

## 2019-11-29 MED ORDER — MIDAZOLAM HCL 2 MG/2ML IJ SOLN
INTRAMUSCULAR | Status: AC
  Filled 2019-11-29: qty 2

## 2019-11-29 MED ORDER — ONDANSETRON HCL 4 MG/2ML IJ SOLN
INTRAMUSCULAR | Status: AC
  Filled 2019-11-29: qty 2

## 2019-11-29 MED ORDER — LACTATED RINGERS IV SOLN
INTRAVENOUS | Status: DC
  Administered 2019-11-29 (×2): via INTRAVENOUS
  Filled 2019-11-29 (×2): qty 1000

## 2019-11-29 MED ORDER — DEXAMETHASONE SODIUM PHOSPHATE 4 MG/ML IJ SOLN
INTRAMUSCULAR | Status: DC | PRN
  Administered 2019-11-29: 5 mg via INTRAVENOUS

## 2019-11-29 MED ORDER — FENTANYL CITRATE (PF) 100 MCG/2ML IJ SOLN
INTRAMUSCULAR | Status: AC
  Filled 2019-11-29: qty 2

## 2019-11-29 MED ORDER — TRAMADOL HCL 50 MG PO TABS
50.0000 mg | ORAL_TABLET | Freq: Once | ORAL | Status: AC
  Administered 2019-11-29: 50 mg via ORAL
  Filled 2019-11-29: qty 1

## 2019-11-29 MED ORDER — LIDOCAINE 2% (20 MG/ML) 5 ML SYRINGE
INTRAMUSCULAR | Status: AC
  Filled 2019-11-29: qty 5

## 2019-11-29 MED ORDER — PROPOFOL 10 MG/ML IV BOLUS
INTRAVENOUS | Status: DC | PRN
  Administered 2019-11-29: 270 mg via INTRAVENOUS

## 2019-11-29 MED ORDER — LIDOCAINE HCL (CARDIAC) PF 100 MG/5ML IV SOSY
PREFILLED_SYRINGE | INTRAVENOUS | Status: DC | PRN
  Administered 2019-11-29: 80 mg via INTRAVENOUS

## 2019-11-29 MED ORDER — ACETAMINOPHEN 500 MG PO TABS
1000.0000 mg | ORAL_TABLET | Freq: Once | ORAL | Status: DC
  Filled 2019-11-29: qty 2

## 2019-11-29 MED ORDER — FENTANYL CITRATE (PF) 100 MCG/2ML IJ SOLN
INTRAMUSCULAR | Status: DC | PRN
  Administered 2019-11-29 (×4): 50 ug via INTRAVENOUS

## 2019-11-29 MED ORDER — SODIUM CHLORIDE 0.9 % IR SOLN
Status: DC | PRN
  Administered 2019-11-29: 3000 mL via INTRAVESICAL

## 2019-11-29 MED ORDER — FENTANYL CITRATE (PF) 100 MCG/2ML IJ SOLN
25.0000 ug | INTRAMUSCULAR | Status: DC | PRN
  Filled 2019-11-29: qty 1

## 2019-11-29 MED ORDER — MIDAZOLAM HCL 5 MG/5ML IJ SOLN
INTRAMUSCULAR | Status: DC | PRN
  Administered 2019-11-29: 2 mg via INTRAVENOUS

## 2019-11-29 SURGICAL SUPPLY — 26 items
BAG DRAIN URO-CYSTO SKYTR STRL (DRAIN) ×3 IMPLANT
BAG DRN UROCATH (DRAIN) ×1
BASKET ZERO TIP NITINOL 2.4FR (BASKET) ×2 IMPLANT
BSKT STON RTRVL ZERO TP 2.4FR (BASKET) ×1
CATH URET 5FR 28IN CONE TIP (BALLOONS)
CATH URET 5FR 28IN OPEN ENDED (CATHETERS) ×2 IMPLANT
CATH URET 5FR 70CM CONE TIP (BALLOONS) IMPLANT
CATH URET DUAL LUMEN 6-10FR 50 (CATHETERS) IMPLANT
CLOTH BEACON ORANGE TIMEOUT ST (SAFETY) ×1 IMPLANT
DRSG TEGADERM 4X4.75 (GAUZE/BANDAGES/DRESSINGS) ×2 IMPLANT
FIBER LASER TRAC TIP (UROLOGICAL SUPPLIES) ×2 IMPLANT
GLOVE BIO SURGEON STRL SZ7.5 (GLOVE) ×3 IMPLANT
GLOVE BIO SURGEON STRL SZ8 (GLOVE) IMPLANT
GOWN STRL REUS W/TWL LRG LVL3 (GOWN DISPOSABLE) ×3 IMPLANT
GUIDEWIRE ANG ZIPWIRE 038X150 (WIRE) IMPLANT
GUIDEWIRE STR DUAL SENSOR (WIRE) ×3 IMPLANT
IV NS IRRIG 3000ML ARTHROMATIC (IV SOLUTION) ×4 IMPLANT
KIT TURNOVER CYSTO (KITS) ×3 IMPLANT
MANIFOLD NEPTUNE II (INSTRUMENTS) ×3 IMPLANT
NS IRRIG 500ML POUR BTL (IV SOLUTION) ×3 IMPLANT
PACK CYSTO (CUSTOM PROCEDURE TRAY) ×3 IMPLANT
SHEATH URETERAL 12FRX35CM (MISCELLANEOUS) ×2 IMPLANT
STENT URET 6FRX24 CONTOUR (STENTS) ×2 IMPLANT
TUBE CONNECTING 12'X1/4 (SUCTIONS) ×1
TUBE CONNECTING 12X1/4 (SUCTIONS) ×2 IMPLANT
TUBING UROLOGY SET (TUBING) ×3 IMPLANT

## 2019-11-29 NOTE — Op Note (Signed)
Preoperative diagnosis: Left UPJ stone  Postoperative diagnosis: Left renal stones  Procedure: Cystoscopy with left ureteroscopy, holmium laser lithotripsy, stone basket extraction, stent exchange  Surgeon: Junious Silk  Anesthesia: General  Indication for procedure: [name] is a 33 year old female who was urgently stented for a left UPJ stone and sepsis a few weeks ago.  She is recovered and returns for definitive stone treatment.  Findings: Joaquim Lai was bumped into the lower pole with the stent and there was another small stone in the lower pole.  The small lower pole stone was removed intact.  The larger stone was relocated to the upper pole fragmented and all the pieces removed.  Description of procedure: After consent was obtained patient brought to the operating room.  After adequate anesthesia she was placed in lithotomy position and prepped and draped in the usual sterile fashion.  A timeout was performed to confirm the patient and procedure.  The cystoscope was passed per urethra and the bladder inspected.  The left ureteral stent was grasped and removed through the urethral meatus.  A sensor wire was advanced into the collecting system.  I then used the access sheath to place a second wire and went adjacent to one of the wires.  The dual channel digital ureteroscope was advanced into the collecting system the left lower pole was inspected and the large stone and dropped into the bottom of the left kidney.  It was grasped and dropped in the upper pole calyx.  The smaller stone was then removed intact.  0 tip basket utilized.  200 m laser fiber was then advanced and the stone broken up at 0.4 and 50 and 0.8 and 8.  All the pieces were extracted with the 0 tip basket and inspection of the collecting system noted there to be no other significant stones or fragments.  The scope was then backed out after the access sheath was slid out onto the ureteroscope.  The ureter was inspected and noted to be  normal without stone fragment or injury.  The wire was backloaded on the cystoscope and a 6 x 24 cm stent advanced.  The wire was removed with a good coil in the bladder and a good coil in the collecting system.  I left a string on the stent.  She was awakened and taken to the recovery room in stable condition.  Complications: None  Blood loss: Minimal  Specimens: Stone fragments to office lab  Drains: 6 x 24 cm left ureteral stent with string  Disposition: Patient stable to PACU-I called and spoke with her mother Baker Janus and went over the procedure, stent and postop care.

## 2019-11-29 NOTE — Transfer of Care (Signed)
Immediate Anesthesia Transfer of Care Note  Patient: [name]  Procedure(s) Performed: URETEROSCOPY WITH HOLMIUM LASER LITHOTRIPSY/ STENT EXCHANGE (Left Pelvis)  Patient Location: PACU  Anesthesia Type:General  Level of Consciousness: awake, alert  and oriented  Airway & Oxygen Therapy: Patient Spontanous Breathing and Patient connected to nasal cannula oxygen  Post-op Assessment: Report given to RN and Post -op Vital signs reviewed and stable  Post vital signs: Reviewed and stable  Last Vitals:  Vitals Value Taken Time  BP 118/90 11/29/19 1454  Temp    Pulse 78 11/29/19 1455  Resp 13 11/29/19 1455  SpO2 100 % 11/29/19 1455  Vitals shown include unvalidated device data.  Last Pain:  Vitals:   11/29/19 1137  TempSrc: Oral         Complications: No apparent anesthesia complications

## 2019-11-29 NOTE — Interval H&P Note (Signed)
History and Physical Interval Note:  11/29/2019 1:31 PM  [place]  has presented today for surgery, with the diagnosis of LEFT URETEROPELVIC JUNCTION STONE.  The various methods of treatment have been discussed with the patient and family. After consideration of risks, benefits and other options for treatment, the patient has consented to  Procedure(s) with comments: URETEROSCOPY WITH HOLMIUM LASER LITHOTRIPSY/ STENT PLACEMENT (Left) - ONLY NEEDS 60 MIN as a surgical intervention. She is well. F/u urine cx negative. No dysuria or fever.   The patient's history has been reviewed, patient examined, no change in status, stable for surgery.  I have reviewed the patient's chart and labs.  Questions were answered to the patient's satisfaction.  Discussed post-op stent and care. She elects to proceed.    [name]

## 2019-11-29 NOTE — OR Nursing (Signed)
Ureteral stent (6 x 24 contour) removed per Dr. Junious Silk in [place] #2.

## 2019-11-29 NOTE — Anesthesia Procedure Notes (Signed)
Procedure Name: LMA Insertion Date/Time: 11/29/2019 2:02 PM Performed by: Bufford Spikes, CRNA Pre-anesthesia Checklist: Patient identified, Emergency Drugs available, Suction available and Patient being monitored Patient Re-evaluated:Patient Re-evaluated prior to induction Oxygen Delivery Method: Circle system utilized Preoxygenation: Pre-oxygenation with 100% oxygen Induction Type: IV induction Ventilation: Mask ventilation without difficulty LMA: LMA inserted LMA Size: 4.0 Number of attempts: 1 Airway Equipment and Method: Bite block Placement Confirmation: positive ETCO2 Tube secured with: Tape Dental Injury: Teeth and Oropharynx as per pre-operative assessment

## 2019-11-29 NOTE — Anesthesia Preprocedure Evaluation (Addendum)
Anesthesia Evaluation  Patient identified by MRN, date of birth, ID band Patient awake    Reviewed: Allergy & Precautions, NPO status , Patient's Chart, lab work & pertinent test results  Airway Mallampati: I  TM Distance: >3 FB Neck ROM: Full    Dental no notable dental hx. (+) Teeth Intact, Dental Advisory Given   Pulmonary neg pulmonary ROS, Current Smoker and Patient abstained from smoking.,    Pulmonary exam normal breath sounds clear to auscultation       Cardiovascular negative cardio ROS Normal cardiovascular exam Rhythm:Regular Rate:Normal     Neuro/Psych  Headaches, PSYCHIATRIC DISORDERS Anxiety    GI/Hepatic GERD  Medicated,(+)     substance abuse  alcohol use,   Endo/Other  negative endocrine ROS  Renal/GU negative Renal ROS  negative genitourinary   Musculoskeletal  (+) Arthritis ,   Abdominal   Peds  Hematology negative hematology ROS (+)   Anesthesia Other Findings   Reproductive/Obstetrics                            Anesthesia Physical Anesthesia Plan  ASA: II  Anesthesia Plan: General   Post-op Pain Management:    Induction: Intravenous  PONV Risk Score and Plan: Ondansetron, Dexamethasone and Midazolam  Airway Management Planned: LMA  Additional Equipment:   Intra-op Plan:   Post-operative Plan: Extubation in OR  Informed Consent: I have reviewed the patients History and Physical, chart, labs and discussed the procedure including the risks, benefits and alternatives for the proposed anesthesia with the patient or authorized representative who has indicated his/her understanding and acceptance.     Dental advisory given  Plan Discussed with: CRNA  Anesthesia Plan Comments:        Anesthesia Quick Evaluation

## 2019-11-29 NOTE — Discharge Instructions (Signed)
Post Anesthesia Home Care Instructions  Activity: Get plenty of rest for the remainder of the day. A responsible individual must stay with you for 24 hours following the procedure.  For the next 24 hours, DO NOT: -Drive a car -Paediatric nurse -Drink alcoholic beverages -Take any medication unless instructed by your physician -Make any legal decisions or sign important papers.  Meals: Start with liquid foods such as gelatin or soup. Progress to regular foods as tolerated. Avoid greasy, spicy, heavy foods. If nausea and/or vomiting occur, drink only clear liquids until the nausea and/or vomiting subsides. Call your physician if vomiting continues.  Special Instructions/Symptoms: Your throat may feel dry or sore from the anesthesia or the breathing tube placed in your throat during surgery. If this causes discomfort, gargle with warm salt water. The discomfort should disappear within 24 hours.  If you had a scopolamine patch placed behind your ear for the management of post- operative nausea and/or vomiting:  1. The medication in the patch is effective for 72 hours, after which it should be removed.  Wrap patch in a tissue and discard in the trash. Wash hands thoroughly with soap and water. 2. You may remove the patch earlier than 72 hours if you experience unpleasant side effects which may include dry mouth, dizziness or visual disturbances. 3. Avoid touching the patch. Wash your hands with soap and water after contact with the patch.    Ureteral Stent Implantation, Care After  REMOVAL OF THE STENT: Remove the stent by pulling the string with slow steady pressure on Friday morning, December 02, 2019.  This sheet gives you information about how to care for yourself after your procedure. Your health care provider may also give you more specific instructions. If you have problems or questions, contact your health care provider. What can I expect after the procedure? After the procedure,  it is common to have:  Nausea.  Mild pain when you urinate. You may feel this pain in your lower back or lower abdomen. The pain should stop within a few minutes after you urinate. This may last for up to 1 week.  A small amount of blood in your urine for several days. Follow these instructions at home: Medicines  Take over-the-counter and prescription medicines only as told by your health care provider.  If you were prescribed an antibiotic medicine, take it as told by your health care provider. Do not stop taking the antibiotic even if you start to feel better.  Do not drive for 24 hours if you were given a sedative during your procedure.  Ask your health care provider if the medicine prescribed to you requires you to avoid driving or using heavy machinery. Activity  Rest as told by your health care provider.  Avoid sitting for a long time without moving. Get up to take short walks every 1-2 hours. This is important to improve blood flow and breathing. Ask for help if you feel weak or unsteady.  Return to your normal activities as told by your health care provider. Ask your health care provider what activities are safe for you. General instructions   Watch for any blood in your urine. Call your health care provider if the amount of blood in your urine increases.  If you have a catheter: ? Follow instructions from your health care provider about taking care of your catheter and collection bag. ? Do not take baths, swim, or use a hot tub until your health care provider approves. Ask your  health care provider if you may take showers. You may only be allowed to take sponge baths.  Drink enough fluid to keep your urine pale yellow.  Do not use any products that contain nicotine or tobacco, such as cigarettes, e-cigarettes, and chewing tobacco. These can delay healing after surgery. If you need help quitting, ask your health care provider.  Keep all follow-up visits as told by your  health care provider. This is important. Contact a health care provider if:  You have pain that gets worse or does not get better with medicine, especially pain when you urinate.  You have difficulty urinating.  You feel nauseous or you vomit repeatedly during a period of more than 2 days after the procedure. Get help right away if:  Your urine is dark red or has blood clots in it.  You are leaking urine (have incontinence).  The end of the stent comes out of your urethra.  You cannot urinate.  You have sudden, sharp, or severe pain in your abdomen or lower back.  You have a fever.  You have swelling or pain in your legs.  You have difficulty breathing. Summary  After the procedure, it is common to have mild pain when you urinate that goes away within a few minutes after you urinate. This may last for up to 1 week.  Watch for any blood in your urine. Call your health care provider if the amount of blood in your urine increases.  Take over-the-counter and prescription medicines only as told by your health care provider.  Drink enough fluid to keep your urine pale yellow. This information is not intended to replace advice given to you by your health care provider. Make sure you discuss any questions you have with your health care provider. Document Released: 08/10/2013 Document Revised: 09/14/2018 Document Reviewed: 09/15/2018 Elsevier Patient Education  2020 [employer].

## 2019-11-30 ENCOUNTER — Encounter (HOSPITAL_BASED_OUTPATIENT_CLINIC_OR_DEPARTMENT_OTHER): Payer: Self-pay | Admitting: Urology

## 2019-11-30 NOTE — Anesthesia Postprocedure Evaluation (Signed)
Anesthesia Post Note  Patient: [name]  Procedure(s) Performed: URETEROSCOPY WITH HOLMIUM LASER LITHOTRIPSY/ STENT EXCHANGE (Left Pelvis)     Patient location during evaluation: PACU Anesthesia Type: General Level of consciousness: awake and alert Pain management: pain level controlled Vital Signs Assessment: post-procedure vital signs reviewed and stable Respiratory status: spontaneous breathing, nonlabored ventilation, respiratory function stable and patient connected to nasal cannula oxygen Cardiovascular status: blood pressure returned to baseline and stable Postop Assessment: no apparent nausea or vomiting Anesthetic complications: no    Last Vitals:  Vitals:   11/29/19 1530 11/29/19 1645  BP: 127/86 128/75  Pulse: 79 79  Resp: (!) 26 20  Temp:  37.2 C  SpO2: 100% 100%    Last Pain:  Vitals:   11/30/19 0932  TempSrc:   PainSc: 4                  Cola Gane L Dakari Cregger

## 2021-02-09 ENCOUNTER — Emergency Department (HOSPITAL_COMMUNITY)
Admission: EM | Admit: 2021-02-09 | Discharge: 2021-02-10 | Disposition: A | Discharge status: Other Acute Inpatient Hospital | Payer: PRIVATE HEALTH INSURANCE | Attending: Emergency Medicine | Admitting: Emergency Medicine

## 2021-02-09 ENCOUNTER — Other Ambulatory Visit: Payer: Self-pay

## 2021-02-09 DIAGNOSIS — Y908 Blood alcohol level of 240 mg/100 ml or more: Secondary | ICD-10-CM | POA: Insufficient documentation

## 2021-02-09 DIAGNOSIS — T50902A Poisoning by unspecified drugs, medicaments and biological substances, intentional self-harm, initial encounter: Secondary | ICD-10-CM

## 2021-02-09 DIAGNOSIS — F333 Major depressive disorder, recurrent, severe with psychotic symptoms: Secondary | ICD-10-CM | POA: Insufficient documentation

## 2021-02-09 DIAGNOSIS — F17210 Nicotine dependence, cigarettes, uncomplicated: Secondary | ICD-10-CM | POA: Insufficient documentation

## 2021-02-09 DIAGNOSIS — T424X2A Poisoning by benzodiazepines, intentional self-harm, initial encounter: Secondary | ICD-10-CM | POA: Insufficient documentation

## 2021-02-09 DIAGNOSIS — F1290 Cannabis use, unspecified, uncomplicated: Secondary | ICD-10-CM | POA: Insufficient documentation

## 2021-02-09 DIAGNOSIS — Z20822 Contact with and (suspected) exposure to covid-19: Secondary | ICD-10-CM | POA: Insufficient documentation

## 2021-02-09 DIAGNOSIS — F10239 Alcohol dependence with withdrawal, unspecified: Secondary | ICD-10-CM | POA: Insufficient documentation

## 2021-02-09 LAB — SALICYLATE LEVEL: Salicylate Lvl: <7 mg/dL — ABNORMAL LOW (ref 7.0–30.0)

## 2021-02-09 LAB — CBC WITH DIFFERENTIAL/PLATELET
Abs Immature Granulocytes: 0.01 10*3/uL (ref 0.00–0.07)
Basophils Absolute: 0 10*3/uL (ref 0.0–0.1)
Basophils Relative: 1 %
Eosinophils Absolute: 0.1 10*3/uL (ref 0.0–0.5)
Eosinophils Relative: 2 %
HCT: 41.1 % (ref 36.0–46.0)
Hemoglobin: 13.9 g/dL (ref 12.0–15.0)
Immature Granulocytes: 0 %
Lymphocytes Relative: 41 %
Lymphs Abs: 3.3 10*3/uL (ref 0.7–4.0)
MCH: 32.6 pg (ref 26.0–34.0)
MCHC: 33.8 g/dL (ref 30.0–36.0)
MCV: 96.5 fL (ref 80.0–100.0)
Monocytes Absolute: 0.4 10*3/uL (ref 0.1–1.0)
Monocytes Relative: 5 %
Neutro Abs: 4.2 10*3/uL (ref 1.7–7.7)
Neutrophils Relative %: 51 %
Platelets: 317 10*3/uL (ref 150–400)
RBC: 4.26 MIL/uL (ref 3.87–5.11)
RDW: 13.1 % (ref 11.5–15.5)
WBC: 8 10*3/uL (ref 4.0–10.5)
nRBC: 0 % (ref 0.0–0.2)

## 2021-02-09 LAB — ETHANOL: Alcohol, Ethyl (B): 243 mg/dL — ABNORMAL HIGH (ref <10)

## 2021-02-09 LAB — I-STAT BETA HCG BLOOD, ED (MC, WL, AP ONLY): I-stat hCG, quantitative: <5 m[IU]/mL (ref <5)

## 2021-02-09 LAB — COMPREHENSIVE METABOLIC PANEL
ALT: 20 U/L (ref 0–44)
AST: 24 U/L (ref 15–41)
Albumin: 4.5 g/dL (ref 3.5–5.0)
Alkaline Phosphatase: 52 U/L (ref 38–126)
Anion gap: 11 (ref 5–15)
BUN: 5 mg/dL — ABNORMAL LOW (ref 6–20)
CO2: 23 mmol/L (ref 22–32)
Calcium: 8.8 mg/dL — ABNORMAL LOW (ref 8.9–10.3)
Chloride: 109 mmol/L (ref 98–111)
Creatinine, Ser: 0.54 mg/dL (ref 0.44–1.00)
GFR, Estimated: >60 mL/min (ref >60)
Glucose, Bld: 97 mg/dL (ref 70–99)
Potassium: 3.6 mmol/L (ref 3.5–5.1)
Sodium: 143 mmol/L (ref 135–145)
Total Bilirubin: 1 mg/dL (ref 0.3–1.2)
Total Protein: 7.5 g/dL (ref 6.5–8.1)

## 2021-02-09 LAB — ACETAMINOPHEN LEVEL: Acetaminophen (Tylenol), Serum: <10 ug/mL — ABNORMAL LOW (ref 10–30)

## 2021-02-09 NOTE — BH Assessment (Signed)
TTS Counselor spoke to [employer], to confirm with An[employer] Boss, to put pt in a private room to complete TTS assessment.  Clinician to call the cart.

## 2021-02-09 NOTE — ED Triage Notes (Addendum)
Pt came in with c/o drug overdose on Klonopin. Pt states she took 8 to 20. Pt states via EMS that she was under a lot of stress, and she just wanted to go to sleep for a bit. B/P 110/60, HR 88, 98% RA, CBG 106. Pt took around 1900

## 2021-02-09 NOTE — ED Provider Notes (Signed)
[facility]-EMERGENCY DEPT Provider Note   CSN: [id number] Arrival date & time: 02/09/21  2022     History Chief Complaint  Patient presents with  . Ingestion    [name] is a 35 y.o. female.  35 year old female who presents after intentional ingestion of Klonopin.  This evening that her boyfriend was cheating on her.  States that she does want to go to sleep.  Klonopin were not prescribed for her.  It is unclear how many that she took and she likely ingested them about 2 hours prior to arrival.  Has not had any emesis.  Denies any alcohol use this evening.  When questioned directly was as a suicide attempt she says no.  Denies responding to internal stimuli.  No prior history of suicide attempt according to her.  EMS was called and blood sugar was 106.  Transferred here for further management        Past Medical History:  Diagnosis Date  . Acid reflux OCCASIONAL  . Anxiety   . CIN III (cervical intraepithelial neoplasia III)   . Endometriosis   . Frequency of urination    nocturia, hematuria, urgency   . Headache   . History of alcohol abuse AS TEEN  . History of kidney stones    ureteral stone right  . History of major depression AGE 64-  SUICIDE ATTEMPT   PT STATES NO PROBLEMS SINCE  . History of suicide attempt WRIST CUTTINE   PT STATES LAST TIME AGE 64--  NO ATTEMPTS SINCE--  RECEIVED COUNSELING  . Lumbar facet arthropathy   . OA (osteoarthritis of spine) LUMBAR --  PER PT  . UTI (lower urinary tract infection)     Patient Active Problem List   Diagnosis Date Noted  . Hyponatremia 11/12/2019  . Leukocytosis 11/12/2019  . Hydronephrosis with renal and ureteral calculus obstruction 11/12/2019  . Renal stone 11/12/2019  . Allergic rhinitis 02/18/2017  . Substance induced mood disorder (HCC) 02/07/2017  . Aphthous ulcer 03/20/2016  . Elevated liver enzymes 07/02/2015  . Unspecified constipation 04/01/2014  . Alcohol dependence,  binge pattern (HCC) 03/30/2014  . Hemorrhoids 04/16/2012  . ANEMIA 08/24/2008  . Generalized anxiety disorder 08/24/2008  . ALCOHOL ABUSE, HX OF 08/24/2008  . NEPHROLITHIASIS 08/16/2008  . TOBACCO ABUSE 05/19/2008  . RENAL CALCULUS, HX OF 03/21/2008    Past Surgical History:  Procedure Laterality Date  . CERVICAL BIOPSY  W/ LOOP ELECTRODE EXCISION  FEB  2013   CIN III  (GYN OFFICE)  . CYSTOSCOPY W/ URETERAL STENT PLACEMENT Left 11/12/2019   Procedure: CYSTOSCOPY WITH RETROGRADE PYELOGRAM/URETERAL STENT PLACEMENT;  Surgeon: Jerilee Field, MD;  Location: [facility];  Service: Urology;  Laterality: Left;  . CYSTOSCOPY WITH URETEROSCOPY  04/20/2012   Procedure: CYSTOSCOPY WITH URETEROSCOPY;  Surgeon: Antony Haste, MD;  Location: [facility]  Service: Urology;  Laterality: N/A;  CYSTO, RIGHT URETEROSCOPY ,  STONE BASKET EXTRACTION   CARM LASER    . ORIF RIGHT LITTLE FINGER FX  2006  . URETEROSCOPY WITH HOLMIUM LASER LITHOTRIPSY Left 11/29/2019   Procedure: URETEROSCOPY WITH HOLMIUM LASER LITHOTRIPSY/ STENT EXCHANGE;  Surgeon: Jerilee Field, MD;  Location: [facility];  Service: Urology;  Laterality: Left;  ONLY NEEDS 60 MIN  . WISDOM TOOTH EXTRACTION  2012   ORAL SURGEON OFFICE     OB History    Gravida  0   Para  0   Term  0   Preterm  0   AB  0   Living  0     SAB  0   IAB  0   Ectopic  0   Multiple  0   Live Births              Family History  Problem Relation Age of Onset  . Heart failure Father   . Cancer Mother   . Hepatitis Other        Autoimmune treated with prednisone    Social History   Tobacco Use  . Smoking status: Current Every Day Smoker    Packs/day: 0.50    Years: 8.00    Pack years: 4.00    Types: Cigarettes  . Smokeless tobacco: Never Used  Vaping Use  . Vaping Use: Never used  Substance Use Topics  . Alcohol use: Not Currently    Alcohol/week: 0.0 standard drinks    Comment: last  drank alcohol few months ago  . Drug use: No    Types: Marijuana    Comment: last marijuana use 2 -3 weeks ago 11-23-2019    Home Medications Prior to Admission medications   Medication Sig Start Date End Date Taking? Authorizing Provider  ALPRAZolam (XANAX) 0.25 MG tablet Take 0.25 mg by mouth 2 (two) times daily as needed for anxiety.     [provider]  docusate sodium (COLACE) 100 MG capsule Take 1 capsule (100 mg total) by mouth 2 (two) times daily. 11/14/19   Alwyn Ren, MD  ondansetron (ZOFRAN) 4 MG tablet Take 1 tablet (4 mg total) by mouth every 6 (six) hours as needed for nausea. 11/14/19   Alwyn Ren, MD    Allergies    Codeine and Bactrim [sulfamethoxazole-trimethoprim]  Review of Systems   Review of Systems  All other systems reviewed and are negative.   Physical Exam Updated Vital Signs Ht 1.549 m (5\' 1" )   Wt 56.7 kg   BMI 23.62 kg/m   Physical Exam Vitals and nursing note reviewed.  Constitutional:      General: She is not in acute distress.    Appearance: Normal appearance. She is well-developed and well-nourished. She is not toxic-appearing.  HENT:     Head: Normocephalic and atraumatic.  Eyes:     General: Lids are normal.     Extraocular Movements: EOM normal.     Conjunctiva/sclera: Conjunctivae normal.     Pupils: Pupils are equal, round, and reactive to light.  Neck:     Thyroid: No thyroid mass.     Trachea: No tracheal deviation.  Cardiovascular:     Rate and Rhythm: Normal rate and regular rhythm.     Heart sounds: Normal heart sounds. No murmur heard. No gallop.   Pulmonary:     Effort: Pulmonary effort is normal. No respiratory distress.     Breath sounds: Normal breath sounds. No stridor. No decreased breath sounds, wheezing, rhonchi or rales.  Abdominal:     General: Bowel sounds are normal. There is no distension.     Palpations: Abdomen is soft.     Tenderness: There is no abdominal tenderness. There is  no CVA tenderness or rebound.  Musculoskeletal:        General: No tenderness or edema. Normal range of motion.     Cervical back: Normal range of motion and neck supple.  Skin:    General: Skin is warm and dry.     Findings: No abrasion or rash.  Neurological:  Mental Status: She is alert and oriented to person, place, and time.     GCS: GCS eye subscore is 4. GCS verbal subscore is 5. GCS motor subscore is 6.     Cranial Nerves: No cranial nerve deficit.     Sensory: No sensory deficit.     Deep Tendon Reflexes: Strength normal.  Psychiatric:        Attention and Perception: She is inattentive.        Mood and Affect: Affect is blunt.        Speech: Speech is delayed.        Behavior: Behavior is withdrawn.        Thought Content: Thought content does not include homicidal or suicidal ideation. Thought content does not include homicidal or suicidal plan.     ED Results / Procedures / Treatments   Labs (all labs ordered are listed, but only abnormal results are displayed) Labs Reviewed  ETHANOL  RAPID URINE DRUG SCREEN, HOSP PERFORMED  SALICYLATE LEVEL  ACETAMINOPHEN LEVEL  CBC WITH DIFFERENTIAL/PLATELET  COMPREHENSIVE METABOLIC PANEL  I-STAT BETA HCG BLOOD, ED ([facility], [facility], [facility] ONLY)    EKG EKG Interpretation  Date/Time:  Saturday February 09 2021 20:42:17 EST Ventricular Rate:  79 PR Interval:    QRS Duration: 63 QT Interval:  361 QTC Calculation: 414 R Axis:   67 Text Interpretation: Sinus rhythm No significant change since last tracing Confirmed by Lorre Nick ([id number]) on 02/09/2021 9:29:23 PM   Radiology No results found.  Procedures Procedures   Medications Ordered in ED Medications - No data to display  ED Course  I have reviewed the triage vital signs and the nursing notes.  Pertinent labs & imaging results that were available during my care of the patient were reviewed by me and considered in my medical decision making (see chart for details).     MDM Rules/Calculators/A&P                          Patient is alert and oriented and protecting her airway at this time.  Alcohol level noted.  Attempted to leave the department.  Patient was IVC for her safety.  She is medically cleared to be seen by behavioral health Final Clinical Impression(s) / ED Diagnoses Final diagnoses:  None    Rx / DC Orders ED Discharge Orders    None       Lorre Nick, MD 02/09/21 2142

## 2021-02-10 ENCOUNTER — Other Ambulatory Visit: Payer: Self-pay

## 2021-02-10 ENCOUNTER — Encounter (HOSPITAL_COMMUNITY): Payer: Self-pay | Admitting: Psychiatry

## 2021-02-10 ENCOUNTER — Inpatient Hospital Stay (HOSPITAL_COMMUNITY)
Admission: AD | Admit: 2021-02-10 | Discharge: 2021-02-13 | DRG: 885 | Disposition: A | Discharge status: Home / Self Care | Payer: Federal, State, Local not specified - Other | Source: Intra-hospital | Attending: Psychiatry | Admitting: Psychiatry

## 2021-02-10 DIAGNOSIS — G4700 Insomnia, unspecified: Secondary | ICD-10-CM | POA: Diagnosis present

## 2021-02-10 DIAGNOSIS — Z20822 Contact with and (suspected) exposure to covid-19: Secondary | ICD-10-CM | POA: Diagnosis present

## 2021-02-10 DIAGNOSIS — F17210 Nicotine dependence, cigarettes, uncomplicated: Secondary | ICD-10-CM | POA: Diagnosis present

## 2021-02-10 DIAGNOSIS — K30 Functional dyspepsia: Secondary | ICD-10-CM | POA: Diagnosis present

## 2021-02-10 DIAGNOSIS — K5900 Constipation, unspecified: Secondary | ICD-10-CM | POA: Diagnosis present

## 2021-02-10 DIAGNOSIS — F332 Major depressive disorder, recurrent severe without psychotic features: Secondary | ICD-10-CM | POA: Diagnosis present

## 2021-02-10 DIAGNOSIS — F1020 Alcohol dependence, uncomplicated: Secondary | ICD-10-CM | POA: Diagnosis present

## 2021-02-10 DIAGNOSIS — F419 Anxiety disorder, unspecified: Secondary | ICD-10-CM | POA: Diagnosis present

## 2021-02-10 DIAGNOSIS — Z9151 Personal history of suicidal behavior: Secondary | ICD-10-CM

## 2021-02-10 DIAGNOSIS — T424X2A Poisoning by benzodiazepines, intentional self-harm, initial encounter: Secondary | ICD-10-CM | POA: Diagnosis present

## 2021-02-10 DIAGNOSIS — Y908 Blood alcohol level of 240 mg/100 ml or more: Secondary | ICD-10-CM | POA: Diagnosis present

## 2021-02-10 LAB — RAPID URINE DRUG SCREEN, HOSP PERFORMED
Amphetamines: NOT DETECTED
Barbiturates: NOT DETECTED
Benzodiazepines: POSITIVE — AB
Cocaine: NOT DETECTED
Opiates: NOT DETECTED
Tetrahydrocannabinol: POSITIVE — AB

## 2021-02-10 LAB — RESP PANEL BY RT-PCR (FLU A&B, COVID) ARPGX2
Influenza A by PCR: NEGATIVE
Influenza B by PCR: NEGATIVE
SARS Coronavirus 2 by RT PCR: NEGATIVE

## 2021-02-10 LAB — LIPID PANEL
Cholesterol: 125 mg/dL (ref 0–200)
HDL: 62 mg/dL (ref >40)
LDL Cholesterol: 44 mg/dL (ref 0–99)
Total CHOL/HDL Ratio: 2 RATIO
Triglycerides: 94 mg/dL (ref <150)
VLDL: 19 mg/dL (ref 0–40)

## 2021-02-10 LAB — TSH: TSH: 0.589 u[IU]/mL (ref 0.350–4.500)

## 2021-02-10 LAB — ETHANOL: Alcohol, Ethyl (B): <10 mg/dL (ref <10)

## 2021-02-10 MED ORDER — ALUM & MAG HYDROXIDE-SIMETH 200-200-20 MG/5ML PO SUSP
30.0000 mL | ORAL | Status: DC | PRN
Start: 2021-02-10 — End: 2021-02-13

## 2021-02-10 MED ORDER — ONDANSETRON 4 MG PO TBDP: 4.0000 mg | ORAL_TABLET | Freq: Four times a day (QID) | ORAL | Status: DC | PRN

## 2021-02-10 MED ORDER — THIAMINE HCL 100 MG PO TABS
100.0000 mg | ORAL_TABLET | Freq: Every day | ORAL | Status: DC
  Administered 2021-02-11 – 2021-02-13 (×3): 100 mg via ORAL
  Filled 2021-02-10 (×4): qty 1

## 2021-02-10 MED ORDER — MAGNESIUM HYDROXIDE 400 MG/5ML PO SUSP
30.0000 mL | Freq: Every day | ORAL | Status: DC | PRN
Start: 2021-02-10 — End: 2021-02-13

## 2021-02-10 MED ORDER — LORAZEPAM 1 MG PO TABS: 1.0000 mg | ORAL_TABLET | Freq: Four times a day (QID) | ORAL | Status: DC | PRN

## 2021-02-10 MED ORDER — ACETAMINOPHEN 325 MG PO TABS
650.0000 mg | ORAL_TABLET | Freq: Four times a day (QID) | ORAL | Status: DC | PRN
  Administered 2021-02-10 – 2021-02-11 (×2): 650 mg via ORAL
  Filled 2021-02-10 (×2): qty 2

## 2021-02-10 MED ORDER — THIAMINE HCL 100 MG PO TABS: 100.0000 mg | ORAL_TABLET | Freq: Every day | ORAL | Status: DC

## 2021-02-10 MED ORDER — ADULT MULTIVITAMIN W/MINERALS CH: 1.0000 | ORAL_TABLET | Freq: Every day | ORAL | Status: DC

## 2021-02-10 MED ORDER — LOPERAMIDE HCL 2 MG PO CAPS: 2.0000 mg | ORAL_CAPSULE | ORAL | Status: DC | PRN

## 2021-02-10 MED ORDER — NICOTINE 21 MG/24HR TD PT24
21.0000 mg | MEDICATED_PATCH | TRANSDERMAL | Status: DC
  Administered 2021-02-10: 21 mg via TRANSDERMAL
  Filled 2021-02-10 (×3): qty 1

## 2021-02-10 MED ORDER — TRAZODONE HCL 50 MG PO TABS
50.0000 mg | ORAL_TABLET | Freq: Every evening | ORAL | Status: DC | PRN
  Administered 2021-02-10 – 2021-02-11 (×2): 50 mg via ORAL
  Filled 2021-02-10 (×2): qty 1

## 2021-02-10 MED ORDER — HYDROXYZINE HCL 25 MG PO TABS: 25.0000 mg | ORAL_TABLET | Freq: Three times a day (TID) | ORAL | Status: DC | PRN

## 2021-02-10 MED ORDER — HYDROXYZINE HCL 25 MG PO TABS
25.0000 mg | ORAL_TABLET | Freq: Four times a day (QID) | ORAL | Status: DC | PRN
  Administered 2021-02-10 – 2021-02-13 (×5): 25 mg via ORAL
  Filled 2021-02-10 (×2): qty 1
  Filled 2021-02-10: qty 10
  Filled 2021-02-10 (×3): qty 1

## 2021-02-10 MED ORDER — ADULT MULTIVITAMIN W/MINERALS CH
1.0000 | ORAL_TABLET | Freq: Every day | ORAL | Status: DC
  Administered 2021-02-10 – 2021-02-13 (×4): 1 via ORAL
  Filled 2021-02-10 (×6): qty 1

## 2021-02-10 MED ORDER — VENLAFAXINE HCL ER 37.5 MG PO CP24
37.5000 mg | ORAL_CAPSULE | Freq: Every day | ORAL | Status: DC
  Administered 2021-02-11 – 2021-02-13 (×3): 37.5 mg via ORAL
  Filled 2021-02-10 (×4): qty 1

## 2021-02-10 MED ORDER — THIAMINE HCL 100 MG/ML IJ SOLN
100.0000 mg | Freq: Once | INTRAMUSCULAR | Status: AC
  Administered 2021-02-10: 100 mg via INTRAMUSCULAR
  Filled 2021-02-10: qty 2

## 2021-02-10 NOTE — [facility] Group Notes (Signed)
[facility] LCSW Group Therapy Note  02/10/2021    Type of Therapy and Topic:  Group Therapy:  Adding Supports Including Yourself  Participation Level:  Did Not Attend    [name]       

## 2021-02-10 NOTE — ED Notes (Signed)
Pt refused to provide covid swab and urine sample. Stating she "would not do anything" because she was "not going anywhere."

## 2021-02-10 NOTE — ED Notes (Signed)
Pt refused to be swabbed for covid or provide a urine sample.

## 2021-02-10 NOTE — Progress Notes (Signed)
Patient admitted to the adult unit from [facility] under IVC. She presented with a flat affect, depressed mood, and was tearful throughout the assessment process. She was cautious during the assessment and forwarded little information. She reported that she doesn't remember what happened and all she remember is waking up in an ambulance. She reported that she tried to leave the hospital but the police told her she couldn't. When asked if she attempted suicide, she stated "I don't know if my mom called the police because I was in a bad spirit. I don't think I would have tried to hurt myself." When asked if something happened that led up to the event, she stated "I don't want to talk about it." She reported drinking alcohol occasionally or binge drinking for 6 months at a time. She denied using any illicit drugs. When asked about taking klonopin, she stated that she does not have a prescription for klonopin and that she only took it that one time and that she got it from someone. Patient denies SI/HI. She denies AVH. She does not appear to be responding to internal or external stimuli. She denies having any benzo/alcohol withdrawal symptoms. Patient was escorted to the unit and oriented to the environment. 15 minute checks performed for safety.

## 2021-02-10 NOTE — BH Assessment (Signed)
Per Binnie Rail, [facility]-- Pt has been accepted to [facility] under the service of Dr. Jola Babinski pending COVID test. Bed will be available after 0800. Notified Dr. Paula Libra and Glenard Haring, RN of acceptance.   Pamalee Leyden, [facility], [facility] Triage Specialist [phone]
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
## 2021-02-10 NOTE — [facility] Group Notes (Signed)
Psychoeducational Group Note  Date: 02-10-21 Time:  1300  Group Topic/Focus:  Making Healthy Choices:   The focus of this group is to help patients identify negative/unhealthy choices they were using prior to admission and identify positive/healthier coping strategies to replace them upon discharge.In this group, patients started asking about the brain and how the brain works with and how the chemicals work for those who use substances, the pros and cons of saboxone.  Participation Level:  Active  Participation Quality:  Appropriate  Affect:  Appropriate  Cognitive:  Oriented  Insight:  Improving  Engagement in Group:  Engaged  Additional Comments: Rates her energy at a 4/10 . Atteneded the group and participated in the buginning of the group.   Dione Housekeeper
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
## 2021-02-10 NOTE — ED Notes (Signed)
Pt ambulated to restroom without assistance. Steady gait noted.

## 2021-02-10 NOTE — ED Notes (Signed)
Attempted to call report to [facility] x 1 

## 2021-02-10 NOTE — [facility] Suicide Risk Assessment (Signed)
[facility] Admission Suicide Risk Assessment   Nursing information obtained from:  Patient Demographic factors:  caucasian Current Mental Status:  Suicide attempt prior to admission  Loss Factors:  Financial issues while out of work for [employer] Historical Factors:  Prior suicide attempts,Family history of mental illness or substance abuse,Impulsivity, previous self-harm behaviors, alcohol use and THC use Risk Reduction Factors:  Employed,Sense of responsibility to [employer] social support  Total Time Spent in Direct Patient Care:  I personally spent 45 minutes on the unit in direct patient care. The direct patient care time included face-to-face time with the patient, reviewing the patient's chart, communicating with other professionals, and coordinating care. Greater than 50% of this time was spent in counseling or coordinating care with the patient regarding goals of hospitalization, psycho-education, and discharge planning needs.  Principal Problem: MDD (major depressive disorder), recurrent severe, without psychosis (HCC) Diagnosis:  Principal Problem:   MDD (major depressive disorder), recurrent severe, without psychosis (HCC) Active Problems:   Alcohol dependence, binge pattern (HCC)  Subjective Data: Patient is a 35 y/o female who was admitted after suicide attempt via ingestion of Klonopin tablets. According to her records she was admitted in 2004 on the adolescent unit for a self-harm behaviors and was admitted in 2018 for a suspected suicide attempt via drug overdose. She has a previous h/o substance induced mood d/o, GAD, and alcohol dependence per EHR review. The patient states the suicide attempt was impulsive in the context of worsening depression over the last 3 weeks. She endorses anhedonia, poor sleep, low energy, feelings of guilt and feelings of worthlessness. She denies AVH, h/o mania, or HI. She has periods of binge drinking and intermittent THC use. She has not been on  antidepressants since 2019 and previously felt Effexor was beneficial for her. See H&P for additional details.   Continued Clinical Symptoms:  Alcohol Use Disorder Identification Test Final Score (AUDIT): 9 The "Alcohol Use Disorders Identification Test", Guidelines for Use in Primary Care, Second Edition.  World Science writer [facility]). Score between 0-7:  no or low risk or alcohol related problems. Score between 8-15:  moderate risk of alcohol related problems. Score between 16-19:  high risk of alcohol related problems. Score 20 or above:  warrants further diagnostic evaluation for alcohol dependence and treatment.  CLINICAL FACTORS:   Depression:   Impulsivity Alcohol/Substance Abuse/Dependencies More than one psychiatric diagnosis Previous Psychiatric Diagnoses and Treatments  Musculoskeletal: Strength & Muscle Tone: within normal limits Gait & Station: normal Patient leans: N/A  Psychiatric Specialty Exam: Physical Exam Vitals reviewed.  HENT:     Head: Normocephalic.  Pulmonary:     Effort: Pulmonary effort is normal.  Neurological:     Mental Status: She is alert.     Review of Systems - see H&P  Blood pressure 100/76, pulse 80, temperature 97.7 F (36.5 C), temperature source Oral, height 5\' 1"  (1.549 m), weight 56.7 kg, SpO2 100 %.Body mass index is 23.62 kg/m.  General Appearance: casually dressed with hair dyed blue/green; fair hygiene  Eye Contact:  Good  Speech:  Clear and Coherent and Normal Rate  Volume:  Normal  Mood:  Depressed  Affect:  Constricted  Thought Process:  Goal Directed and Linear  Orientation:  Full (Time, Place, and Person)  Thought Content:  Denies AVH or paranoia; no delusions, obsessions/compulsions or acute psychosis on exam  Suicidal Thoughts:  suicide attempt prior to admission; denies current SI, intent or plan  Homicidal Thoughts:  Denied  Memory:  Recent;  Fair  Judgement:  Impaired  Insight:  Fair  Psychomotor Activity:   Normal  Concentration:  Concentration: Good and Attention Span: Good  Recall:  Fair  Fund of Knowledge:  Good  Language:  Good  Akathisia:  Negative  Assets:  Communication Skills Desire for Improvement Housing Resilience Social Support  ADL's:  Intact  Cognition:  WNL   COGNITIVE FEATURES THAT CONTRIBUTE TO RISK:  None    SUICIDE RISK:   Moderate:  Frequent suicidal ideation with limited intensity, and duration, some specificity in terms of plans, no associated intent, good self-control, limited dysphoria/symptomatology, some risk factors present, and identifiable protective factors, including available and accessible social support.  PLAN OF CARE: Patient requests to change from IVC to voluntary treatment. Admission labs were reviewed: CMP WNL except for Ca+ 8.8 and BUN 5; CBC WNL; tylenol <10, salicylate <7, pregnancy test negative; alcohol 243, UDS positive for benzodiazepines and THC; EKG shows sinus rhythm QTc . Discussion of various antidepressant options were discussed with the patient and she opts to restart Effexor XR which worked for her previously. The specific risks of potential HTN with this med, risk of activation, and risk of SI with this medication were discussed and she consents to med trial. Will check TSH and place patient on CIWA protocol. She declines options of residential rehab or SAIOP at this time.   I certify that inpatient services furnished can reasonably be expected to improve the patient's condition.   Comer Locket, MD, FAPA 02/10/2021, 5:53 PM

## 2021-02-11 LAB — HEMOGLOBIN A1C
Hgb A1c MFr Bld: 5 % (ref 4.8–5.6)
Mean Plasma Glucose: 96.8 mg/dL

## 2021-02-11 MED ORDER — NICOTINE 21 MG/24HR TD PT24
21.0000 mg | MEDICATED_PATCH | TRANSDERMAL | Status: DC
  Administered 2021-02-11 – 2021-02-12 (×2): 21 mg via TRANSDERMAL
  Filled 2021-02-11 (×2): qty 1

## 2021-02-11 MED ORDER — TRAZODONE HCL 50 MG PO TABS
50.0000 mg | ORAL_TABLET | Freq: Once | ORAL | Status: AC
  Administered 2021-02-11: 50 mg via ORAL
  Filled 2021-02-11 (×2): qty 1

## 2021-02-11 MED ORDER — GABAPENTIN 100 MG PO CAPS
200.0000 mg | ORAL_CAPSULE | Freq: Three times a day (TID) | ORAL | Status: DC
Start: 2021-02-11 — End: 2021-02-13
  Administered 2021-02-11 – 2021-02-13 (×5): 200 mg via ORAL
  Filled 2021-02-11 (×10): qty 2

## 2021-02-11 NOTE — Progress Notes (Signed)
Recreation Therapy Notes  Date: 2.21.22 Time: 0930 Location: [address] Group Room  Group Topic: Stress Management   Goal Area(s) Addresses:  Patient will actively participate in stress management techniques presented during session.   Intervention: Stress management techniques  Activity: Guided Imagery.  LRT read a script that took patients on a journey to enjoy the sights, sounds and adventure of what the forest has to offer.  Patients were to sit quietly, focus on breathing and imagine themselves in the peaceful wonders of the forest.  Education:  Stress Management, Discharge Planning.   Education Outcome: Acknowledges education  Clinical Observations/Feedback: Patient did not attend group session.    Caroll Rancher, LRT/CTRS    Lillia Abed, Aeisha Minarik A 02/11/2021 11:11 AM

## 2021-02-11 NOTE — Plan of Care (Signed)
  Problem: Education: Goal: Knowledge of [facility] Education information/materials will improve Outcome: Progressing Goal: Emotional status will improve Outcome: Progressing Goal: Mental status will improve Outcome: Progressing Goal: Verbalization of understanding the information provided will improve Outcome: Progressing   

## 2021-02-11 NOTE — Progress Notes (Signed)
Pt reports feeling anxious all the time and over worrying. Pt rates her anxiety a 4 on a scale of 0-10 tonight (10 being the greatest). Pt said that she has been trying to think positive. She said that she has been feeling depressed since she had Covid in February and due to the changes that Covid has had on the world. The only withdrawal symptom she endorsed was sweaty hands. She feels like she has no felt no drastic change in her mood with her medication yet. Educated pt that it can take up to 4-6 weeks for her to see a difference. Pt was being loud while talking in her room with her roommate at bedtime. Asked pt to lower her voice since she could be heard at the nurses station. Pt demonstrated understanding and was given a repeat one time dose of 50 mg of trazodone at 2245 for c/o difficulty sleeping. Pt denies SI/HI and AVH. Active listening, reassurance, and support provided. Medications administered as ordered by provider. Q 15 min safety checks continue. Pt's safety has been maintained.   02/11/21 2102  Psych Admission Type (Psych Patients Only)  Admission Status Involuntary  Psychosocial Assessment  Patient Complaints Anxiety;Depression;Worrying  Eye Contact Brief;Avoids  Facial Expression Animated;Anxious;Sad  Affect Anxious;Appropriate to circumstance;Depressed;Sad  Speech Logical/coherent  Interaction Minimal;Guarded  Motor Activity Fidgety  Appearance/Hygiene Unremarkable  Behavior Characteristics Cooperative;Anxious;Fidgety;Guarded  Mood Depressed;Anxious;Sad;Pleasant  Thought Process  Coherency WDL  Content WDL  Delusions None reported or observed  Perception WDL  Hallucination None reported or observed  Judgment Poor  Confusion None  Danger to Self  Current suicidal ideation? Denies  Danger to Others  Danger to Others None reported or observed
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
## 2021-02-11 NOTE — Tx Team (Signed)
Interdisciplinary Treatment and Diagnostic Plan Update  02/11/2021 Time of Session: 9:40am [name] MRN: [id number]  Principal Diagnosis: MDD (major depressive disorder), recurrent severe, without psychosis ([place])  Secondary Diagnoses: Principal Problem:   MDD (major depressive disorder), recurrent severe, without psychosis ([place]) Active Problems:   Alcohol dependence, binge pattern ([place])   Current Medications:  Current Facility-Administered Medications  Medication Dose Route Frequency Provider Last Rate Last Admin  . acetaminophen (TYLENOL) tablet 650 mg  650 mg Oral Q6H PRN Prescilla Sours, PA-C   650 mg at 02/10/21 2124  . alum & mag hydroxide-simeth (MAALOX/MYLANTA) 200-200-20 MG/5ML suspension 30 mL  30 mL Oral Q4H PRN Margorie John W, PA-C      . hydrOXYzine (ATARAX/VISTARIL) tablet 25 mg  25 mg Oral Q6H PRN Freida Busman, MD   25 mg at 02/10/21 2124  . loperamide (IMODIUM) capsule 2-4 mg  2-4 mg Oral PRN Damita Dunnings B, MD      . LORazepam (ATIVAN) tablet 1 mg  1 mg Oral Q6H PRN Damita Dunnings B, MD      . magnesium hydroxide (MILK OF MAGNESIA) suspension 30 mL  30 mL Oral Daily PRN Margorie John W, PA-C      . multivitamin with minerals tablet 1 tablet  1 tablet Oral Daily Damita Dunnings B, MD   1 tablet at 02/11/21 0737  . nicotine (NICODERM CQ - dosed in mg/24 hours) patch 21 mg  21 mg Transdermal Q24H Viann Fish E, MD   21 mg at 02/10/21 1731  . ondansetron (ZOFRAN-ODT) disintegrating tablet 4 mg  4 mg Oral Q6H PRN Damita Dunnings B, MD      . thiamine tablet 100 mg  100 mg Oral Daily Damita Dunnings B, MD   100 mg at 02/11/21 0737  . traZODone (DESYREL) tablet 50 mg  50 mg Oral QHS PRN Prescilla Sours, PA-C   50 mg at 02/10/21 2124  . venlafaxine XR (EFFEXOR-XR) 24 hr capsule 37.5 mg  37.5 mg Oral Q breakfast Damita Dunnings B, MD   37.5 mg at 02/11/21 1157   PTA Medications: Medications Prior to Admission  Medication Sig Dispense Refill Last Dose  . docusate sodium  (COLACE) 100 MG capsule Take 1 capsule (100 mg total) by mouth 2 (two) times daily. (Patient not taking: No sig reported) 10 capsule 0   . ondansetron (ZOFRAN) 4 MG tablet Take 1 tablet (4 mg total) by mouth every 6 (six) hours as needed for nausea. (Patient not taking: No sig reported) 20 tablet 0     Patient Stressors:    Patient Strengths:    Treatment Modalities: Medication Management, Group therapy, Case management,  1 to 1 session with clinician, Psychoeducation, Recreational therapy.   Physician Treatment Plan for Primary Diagnosis: MDD (major depressive disorder), recurrent severe, without psychosis ([place]) Long Term Goal(s): Improvement in symptoms so as ready for discharge Improvement in symptoms so as ready for discharge   Short Term Goals: Ability to identify changes in lifestyle to reduce recurrence of condition will improve Ability to verbalize feelings will improve Ability to disclose and discuss suicidal ideas Ability to demonstrate self-control will improve Ability to identify and develop effective coping behaviors will improve Ability to maintain clinical measurements within normal limits will improve Compliance with prescribed medications will improve Ability to identify triggers associated with substance abuse/mental health issues will improve Ability to identify changes in lifestyle to reduce recurrence of condition will improve Ability to verbalize feelings will improve Ability  to disclose and discuss suicidal ideas Ability to demonstrate self-control will improve Ability to identify and develop effective coping behaviors will improve Ability to maintain clinical measurements within normal limits will improve Compliance with prescribed medications will improve Ability to identify triggers associated with substance abuse/mental health issues will improve  Medication Management: Evaluate patient's response, side effects, and tolerance of medication  regimen.  Therapeutic Interventions: 1 to 1 sessions, Unit Group sessions and Medication administration.  Evaluation of Outcomes: Not Met  Physician Treatment Plan for Secondary Diagnosis: Principal Problem:   MDD (major depressive disorder), recurrent severe, without psychosis ([place]) Active Problems:   Alcohol dependence, binge pattern ([place])  Long Term Goal(s): Improvement in symptoms so as ready for discharge Improvement in symptoms so as ready for discharge   Short Term Goals: Ability to identify changes in lifestyle to reduce recurrence of condition will improve Ability to verbalize feelings will improve Ability to disclose and discuss suicidal ideas Ability to demonstrate self-control will improve Ability to identify and develop effective coping behaviors will improve Ability to maintain clinical measurements within normal limits will improve Compliance with prescribed medications will improve Ability to identify triggers associated with substance abuse/mental health issues will improve Ability to identify changes in lifestyle to reduce recurrence of condition will improve Ability to verbalize feelings will improve Ability to disclose and discuss suicidal ideas Ability to demonstrate self-control will improve Ability to identify and develop effective coping behaviors will improve Ability to maintain clinical measurements within normal limits will improve Compliance with prescribed medications will improve Ability to identify triggers associated with substance abuse/mental health issues will improve     Medication Management: Evaluate patient's response, side effects, and tolerance of medication regimen.  Therapeutic Interventions: 1 to 1 sessions, Unit Group sessions and Medication administration.  Evaluation of Outcomes: Not Met   RN Treatment Plan for Primary Diagnosis: MDD (major depressive disorder), recurrent severe, without psychosis ([place]) Long Term Goal(s): Knowledge  of disease and therapeutic regimen to maintain health will improve  Short Term Goals: Ability to remain free from injury will improve, Ability to verbalize frustration and anger appropriately will improve, Ability to identify and develop effective coping behaviors will improve and Compliance with prescribed medications will improve  Medication Management: RN will administer medications as ordered by provider, will assess and evaluate patient's response and provide education to patient for prescribed medication. RN will report any adverse and/or side effects to prescribing provider.  Therapeutic Interventions: 1 on 1 counseling sessions, Psychoeducation, Medication administration, Evaluate responses to treatment, Monitor vital signs and CBGs as ordered, Perform/monitor CIWA, COWS, AIMS and Fall Risk screenings as ordered, Perform wound care treatments as ordered.  Evaluation of Outcomes: Not Met   LCSW Treatment Plan for Primary Diagnosis: MDD (major depressive disorder), recurrent severe, without psychosis ([place]) Long Term Goal(s): Safe transition to appropriate next level of care at discharge, Engage patient in therapeutic group addressing interpersonal concerns.  Short Term Goals: Engage patient in aftercare planning with referrals and resources, Increase social support, Facilitate patient progression through stages of change regarding substance use diagnoses and concerns, Identify triggers associated with mental health/substance abuse issues and Increase skills for wellness and recovery  Therapeutic Interventions: Assess for all discharge needs, 1 to 1 time with Social worker, Explore available resources and support systems, Assess for adequacy in community support network, Educate family and significant other(s) on suicide prevention, Complete Psychosocial Assessment, Interpersonal group therapy.  Evaluation of Outcomes: Not Met   Progress in Treatment: Attending groups: Yes.  Participating  in groups: Yes. Taking medication as prescribed: Yes. Toleration medication: Yes. Family/Significant other contact made: No, will contact:  if consent is provided Patient understands diagnosis: Yes. Discussing patient identified problems/goals with staff: Yes. Medical problems stabilized or resolved: Yes. Denies suicidal/homicidal ideation: Yes. Issues/concerns per patient self-inventory: No.  New problem(s) identified: No, Describe:  none  New Short Term/Long Term Goal(s): detox, medication management for mood stabilization; elimination of SI thoughts; development of comprehensive mental wellness/sobriety plan  Patient Goals:  Did not attend  Discharge Plan or Barriers: Patient recently admitted. CSW will continue to follow and assess for appropriate referrals and possible discharge planning.    Reason for Continuation of Hospitalization: Depression Hallucinations Medication stabilization Suicidal ideation Withdrawal symptoms  Estimated Length of Stay: 3-5 days  Attendees: Patient: Did not attend 02/11/2021   Physician:  02/11/2021   Nursing:  02/11/2021  RN Care Manager: 02/11/2021   Social Worker: Darletta Moll 02/11/2021  Recreational Therapist:  02/11/2021   Other:  02/11/2021   Other:  02/11/2021   Other: 02/11/2021     Scribe for Treatment Team: Vassie Moselle, LCSW 02/11/2021 10:15 AM
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
## 2021-02-11 NOTE — Progress Notes (Signed)
Psychoeducational Group Note  Date:  02/11/2021 Time:  2224  Group Topic/Focus:  Wrap-Up Group:   The focus of this group is to help patients review their daily goal of treatment and discuss progress on daily workbooks.  Participation Level: Did Not Attend  Participation Quality:  Not Applicable  Affect:  Not Applicable  Cognitive:  Not Applicable  Insight:  Not Applicable  Engagement in Group: Not Applicable  Additional Comments:  The patient did not attend group this evening.   Hazle Coca S 02/11/2021, 10:24 PM

## 2021-02-11 NOTE — Progress Notes (Signed)
   02/11/21 0041  Psych Admission Type (Psych Patients Only)  Admission Status Involuntary  Psychosocial Assessment  Patient Complaints Worrying;Anxiety  Eye Contact Brief  Facial Expression Sad  Affect Anxious;Depressed  Speech Logical/coherent  Interaction Cautious;Minimal  Motor Activity Other (Comment) (WNL)  Appearance/Hygiene Unremarkable  Behavior Characteristics Cooperative  Mood Anxious;Depressed  Thought Process  Coherency WDL  Content Blaming others  Delusions None reported or observed  Perception WDL  Hallucination None reported or observed  Judgment WDL  Confusion None  Danger to Self  Current suicidal ideation? Denies  Danger to Others  Danger to Others None reported or observed  D: Patient presented with depressed mood and anxious affect. Pt rates anxiety as 7 on and dep as 5 on 0-10 scale.  Pt apperceptive of staff help. A: Medications administered as prescribed. Support and encouragement provided as needed.  R: Patient remains safe on the unit. Will continue to monitor for safety and stability.

## 2021-02-11 NOTE — [facility] Group Notes (Signed)
Occupational Therapy Group Note Date: 02/11/2021 Group Topic/Focus: Socialization/Social Skills  Group Description: Group encouraged increased engagement and participation through discussion focused on "Breaking Down Barriers." Patients were given a handout to fill out and engage in a structured discussion around barrier to treatment including grief, guilt, substance use, etc. Discussion focused on strategies to combat barriers identified.   Participation Level: Patient did not attend OT group session despite personal invitation.    Plan: Continue to engage patient in OT groups 2 - 3x/week.  02/11/2021  Bairon Klemann, MOT, OTR/L  

## 2021-02-12 MED ORDER — TRAZODONE HCL 50 MG PO TABS
50.0000 mg | ORAL_TABLET | Freq: Once | ORAL | Status: AC
  Administered 2021-02-12: 50 mg via ORAL
  Filled 2021-02-12 (×2): qty 1

## 2021-02-12 MED ORDER — TRAZODONE HCL 100 MG PO TABS
100.0000 mg | ORAL_TABLET | Freq: Every day | ORAL | Status: DC
  Administered 2021-02-12: 100 mg via ORAL
  Filled 2021-02-12 (×2): qty 1
  Filled 2021-02-12: qty 7

## 2021-02-12 NOTE — Progress Notes (Signed)
Recreation Therapy Notes  Animal-Assisted Activity (AAA) Program Checklist/Progress Notes Patient Eligibility Criteria Checklist & Daily Group note for Rec Tx Intervention  Date: 2.22.22 Time: 1430 Location: [address]   AAA/T Program Assumption of Risk Form signed by Engineer, production or Parent Legal Guardian YES   Patient is free of allergies or severe asthma YES  Patient reports no fear of animals YES   Patient reports no history of cruelty to animals YES  Patient understands his/her participation is voluntary YES  Patient washes hands before animal contact YES   Patient washes hands after animal contact YES   Behavioral Response: Engaged  Education: Charity fundraiser, Appropriate Animal Interaction   Education Outcome: Acknowledges understanding/In group clarification offered/Needs additional education.   Clinical Observations/Feedback: Pt attended and participated in activity.    Caroll Rancher, LRT/CTRS         Caroll Rancher A 02/12/2021 3:53 PM

## 2021-02-12 NOTE — [facility] Group Notes (Signed)
Adult Psychoeducational Group Note  Date:  02/12/2021 Time:  10:58 PM  Group Topic/Focus:  Wrap-Up Group:   The focus of this group is to help patients review their daily goal of treatment and discuss progress on daily workbooks.  Participation Level:  Active  Participation Quality:  Attentive  Affect:  Appropriate  Cognitive:  Appropriate  Insight: Good  Engagement in Group:  Engaged  Modes of Intervention:  Discussion  Additional Comments  Jacalyn Lefevre 02/12/2021, 10:58 PM

## 2021-02-12 NOTE — Progress Notes (Incomplete)
   02/12/21 0615  Vital Signs  Temp 97.8 F (36.6 C)  Temp Source Oral  Pulse Rate 80  BP 104/65  BP Location Right Arm  BP Method Automatic  Patient Position (if appropriate) Sitting  CIWA-Ar  Nausea and Vomiting 0  Tactile Disturbances 0  Tremor 0  Auditory Disturbances 0  Paroxysmal Sweats 0  Visual Disturbances 0  Anxiety 1  Headache, Fullness in Head 0  Agitation 0  Orientation and Clouding of Sensorium 0  CIWA-Ar Total 1   D: Patient denies SI/HI/AVH. Patient rated both anxiety and depression 4/10. Patient out in open areas and was social with peers and staff.  A:  Patient took scheduled medicine.  Support and encouragement provided Routine safety checks conducted every 15 minutes. Patient  Informed to notify staff with any concerns.   R: Safety maintained.

## 2021-02-12 NOTE — [facility] Counselor (Signed)
CSW provided the patient with a packet of resources that include a list of women's shelters, insurance applications and information, and information about alcohol use and where individuals can find extended help for substance use issues.  CSW will follow up with the patient to see if these resources were helpful.  CSW also has a packet of information for the patient's family that explains alcohol and substance use, where to find help, and how families can help their loved ones and themselves.  CSW will provide family with this resource at discharge.

## 2021-02-12 NOTE — Progress Notes (Signed)
[facility] MD Progress Note  02/12/2021 10:39 AM Zaire Vanbuskirk  MRN:  [id number] Subjective:  This AM patient reports that she did have some issues going to sleep last night. Patient reports that she was feeling anxious about wanting to go home, return to her job, and missing her dogs at home. Patient reports that she spoke about her life with her new roommate at this helped her realize that talking to others can be helpful at times. Patient also took an extra dose of Trazodone and reported that she slept well after this. Patient reports that she is feeling "fine" this AM. Patient reports that she is hoping to leave soon as she begins to also talk about a concert that she has tickets for on Thursday. MD and patient talk about how patient has appeared to minimize some of anxiety and depression. Patient reports that she does struggle with talking to others and opening up. Patient agreed that she can be guarded, but she reports that she will work on this. Patient reports that she is eating well and her anxiety remains 4/10. Patient reports feeling that her medication is helpful and remains interested in therapy outpatient and psychiatry. Patient does not endorse SI, HI, nor AVH today; however patient does endorse that she wishes she could go to sleep without her thoughts taking over. Principal Problem: MDD (major depressive disorder), recurrent severe, without psychosis (HCC) Diagnosis: Principal Problem:   MDD (major depressive disorder), recurrent severe, without psychosis (HCC) Active Problems:   Alcohol dependence, binge pattern (HCC)  Total Time spent with patient: 20 minutes  Past Psychiatric History: See H&P  Past Medical History:  Past Medical History:  Diagnosis Date  . Acid reflux OCCASIONAL  . Anxiety   . CIN III (cervical intraepithelial neoplasia III)   . Endometriosis   . Frequency of urination    nocturia, hematuria, urgency   . Headache   . History of alcohol abuse AS TEEN  .  History of kidney stones    ureteral stone right  . History of major depression AGE 58-  SUICIDE ATTEMPT   PT STATES NO PROBLEMS SINCE  . History of suicide attempt WRIST CUTTINE   PT STATES LAST TIME AGE 58--  NO ATTEMPTS SINCE--  RECEIVED COUNSELING  . Lumbar facet arthropathy   . OA (osteoarthritis of spine) LUMBAR --  PER PT  . UTI (lower urinary tract infection)     Past Surgical History:  Procedure Laterality Date  . CERVICAL BIOPSY  W/ LOOP ELECTRODE EXCISION  FEB  2013   CIN III  (GYN OFFICE)  . CYSTOSCOPY W/ URETERAL STENT PLACEMENT Left 11/12/2019   Procedure: CYSTOSCOPY WITH RETROGRADE PYELOGRAM/URETERAL STENT PLACEMENT;  Surgeon: Jerilee Field, MD;  Location: [facility];  Service: Urology;  Laterality: Left;  . CYSTOSCOPY WITH URETEROSCOPY  04/20/2012   Procedure: CYSTOSCOPY WITH URETEROSCOPY;  Surgeon: Antony Haste, MD;  Location: [facility];  Service: Urology;  Laterality: N/A;  CYSTO, RIGHT URETEROSCOPY ,  STONE BASKET EXTRACTION   CARM LASER    . ORIF RIGHT LITTLE FINGER FX  2006  . URETEROSCOPY WITH HOLMIUM LASER LITHOTRIPSY Left 11/29/2019   Procedure: URETEROSCOPY WITH HOLMIUM LASER LITHOTRIPSY/ STENT EXCHANGE;  Surgeon: Jerilee Field, MD;  Location: [facility];  Service: Urology;  Laterality: Left;  ONLY NEEDS 60 MIN  . WISDOM TOOTH EXTRACTION  2012   ORAL SURGEON OFFICE   Family History:  Family History  Problem Relation Age of Onset  . Heart  failure Father   . Cancer Mother   . Hepatitis Other        Autoimmune treated with prednisone   Family Psychiatric  History: See H&P Social History:  Social History   Substance and Sexual Activity  Alcohol Use Not Currently  . Alcohol/week: 0.0 standard drinks   Comment: last drank alcohol few months ago     Social History   Substance and Sexual Activity  Drug Use No  . Types: Marijuana   Comment: last marijuana use 2 -3 weeks ago 11-23-2019    Social History    Socioeconomic History  . Marital status: Single    Spouse name: Not on file  . Number of children: Not on file  . Years of education: Not on file  . Highest education level: Not on file  Occupational History  . Occupation: Hair Dresser  Tobacco Use  . Smoking status: Current Every Day Smoker    Packs/day: 0.50    Years: 8.00    Pack years: 4.00    Types: Cigarettes  . Smokeless tobacco: Never Used  Vaping Use  . Vaping Use: Never used  Substance and Sexual Activity  . Alcohol use: Not Currently    Alcohol/week: 0.0 standard drinks    Comment: last drank alcohol few months ago  . Drug use: No    Types: Marijuana    Comment: last marijuana use 2 -3 weeks ago 11-23-2019  . Sexual activity: Yes    Birth control/protection: None  Other Topics Concern  . Not on file  Social History Narrative  . Not on file   Social Determinants of Health   Financial Resource Strain: Not on file  Food Insecurity: Not on file  Transportation Needs: Not on file  Physical Activity: Not on file  Stress: Not on file  Social Connections: Not on file   Additional Social History:                         Sleep: Poor  Appetite:  Fair  Current Medications: Current Facility-Administered Medications  Medication Dose Route Frequency Provider Last Rate Last Admin  . acetaminophen (TYLENOL) tablet 650 mg  650 mg Oral Q6H PRN Jaclyn Shaggy, PA-C   650 mg at 02/11/21 1506  . alum & mag hydroxide-simeth (MAALOX/MYLANTA) 200-200-20 MG/5ML suspension 30 mL  30 mL Oral Q4H PRN Melbourne Abts W, PA-C      . gabapentin (NEURONTIN) capsule 200 mg  200 mg Oral TID Eliseo Gum B, MD   200 mg at 02/12/21 0737  . hydrOXYzine (ATARAX/VISTARIL) tablet 25 mg  25 mg Oral Q6H PRN Eliseo Gum B, MD   25 mg at 02/12/21 0737  . loperamide (IMODIUM) capsule 2-4 mg  2-4 mg Oral PRN Eliseo Gum B, MD      . LORazepam (ATIVAN) tablet 1 mg  1 mg Oral Q6H PRN Eliseo Gum B, MD      . magnesium hydroxide  (MILK OF MAGNESIA) suspension 30 mL  30 mL Oral Daily PRN Melbourne Abts W, PA-C      . multivitamin with minerals tablet 1 tablet  1 tablet Oral Daily Eliseo Gum B, MD   1 tablet at 02/12/21 0737  . nicotine (NICODERM CQ - dosed in mg/24 hours) patch 21 mg  21 mg Transdermal Q24H Cristofano, Worthy Rancher, MD   21 mg at 02/12/21 0739  . ondansetron (ZOFRAN-ODT) disintegrating tablet 4 mg  4 mg Oral Q6H PRN Eliseo Gum  B, MD      . thiamine tablet 100 mg  100 mg Oral Daily Eliseo Gum B, MD   100 mg at 02/12/21 0737  . traZODone (DESYREL) tablet 100 mg  100 mg Oral QHS Eliseo Gum B, MD      . venlafaxine XR (EFFEXOR-XR) 24 hr capsule 37.5 mg  37.5 mg Oral Q breakfast Eliseo Gum B, MD   37.5 mg at 02/12/21 5621    Lab Results:  Results for orders placed or performed during the hospital encounter of 02/10/21 (from the past 48 hour(s))  TSH     Status: None   Collection Time: 02/10/21  5:58 PM  Result Value Ref Range   TSH 0.589 0.350 - 4.500 uIU/mL    Comment: Performed by a 3rd Generation assay with a functional sensitivity of <=0.01 uIU/mL. Performed at [facility], [address] [place], [place] [address]   Ethanol     Status: None   Collection Time: 02/10/21  5:58 PM  Result Value Ref Range   Alcohol, Ethyl (B) <10 <10 mg/dL    Comment: (NOTE) Lowest detectable limit for serum alcohol is 10 mg/dL.  For medical purposes only. Performed at [facility], [address], [place], [place] [address]   Lipid panel     Status: None   Collection Time: 02/10/21  5:58 PM  Result Value Ref Range   Cholesterol 125 0 - 200 mg/dL   Triglycerides 94 <629 mg/dL   HDL 62 >52 mg/dL   Total CHOL/HDL Ratio 2.0 RATIO   VLDL 19 0 - 40 mg/dL   LDL Cholesterol 44 0 - 99 mg/dL    Comment:        Total Cholesterol/HDL:CHD Risk Coronary Heart Disease Risk Table                     Men   Women  1/2 Average Risk   3.4   3.3  Average Risk       5.0   4.4  2  X Average Risk   9.6   7.1  3 X Average Risk  23.4   11.0        Use the calculated Patient Ratio above and the CHD Risk Table to determine the patient's CHD Risk.        ATP III CLASSIFICATION (LDL):  <100     mg/dL   Optimal  841-324  mg/dL   Near or Above                    Optimal  130-159  mg/dL   Borderline  401-027  mg/dL   High  >253     mg/dL   Very High Performed at [facility], [address], [place], [place] [address]   Hemoglobin A1c     Status: None   Collection Time: 02/10/21  5:58 PM  Result Value Ref Range   Hgb A1c MFr Bld 5.0 4.8 - 5.6 %    Comment: (NOTE) Pre diabetes:          5.7%-6.4%  Diabetes:              >6.4%  Glycemic control for   <7.0% adults with diabetes    Mean Plasma Glucose 96.8 mg/dL    Comment: Performed at [facility], [address], [place], [place] [address]    Blood Alcohol level:  Lab Results  Component Value  Date   ETH <10 02/10/2021   ETH 243 (H) 02/09/2021    Metabolic Disorder Labs: Lab Results  Component Value Date   HGBA1C 5.0 02/10/2021   MPG 96.8 02/10/2021   No results found for: PROLACTIN Lab Results  Component Value Date   CHOL 125 02/10/2021   TRIG 94 02/10/2021   HDL 62 02/10/2021   CHOLHDL 2.0 02/10/2021   VLDL 19 02/10/2021   LDLCALC 44 02/10/2021    Physical Findings: AIMS: Facial and Oral Movements Muscles of Facial Expression: None, normal Lips and Perioral Area: None, normal Jaw: None, normal Tongue: None, normal,Extremity Movements Upper (arms, wrists, hands, fingers): None, normal Lower (legs, knees, ankles, toes): None, normal, Trunk Movements Neck, shoulders, hips: None, normal, Overall Severity Severity of abnormal movements (highest score from questions above): None, normal Incapacitation due to abnormal movements: None, normal Patient's awareness of abnormal movements (rate only patient's report): No Awareness, Dental Status Current problems with teeth  and/or dentures?: No Does patient usually wear dentures?: No  CIWA:  CIWA-Ar Total: 1 COWS:     Musculoskeletal: Strength & Muscle Tone: within normal limits Gait & Station: normal Patient leans: N/A  Psychiatric Specialty Exam: Physical Exam Constitutional:      Appearance: Normal appearance.  HENT:     Head: Normocephalic and atraumatic.  Pulmonary:     Effort: Pulmonary effort is normal.  Neurological:     Mental Status: She is alert.     Review of Systems  Cardiovascular: Negative for chest pain.  Gastrointestinal: Negative for abdominal pain.  Neurological: Negative for headaches.    Blood pressure 110/79, pulse (!) 115, temperature 97.8 F (36.6 C), temperature source Oral, height 5\' 1"  (1.549 m), weight 56.7 kg, SpO2 100 %.Body mass index is 23.62 kg/m.  General Appearance: Fairly Groomed  Eye Contact:  Good  Speech:  Clear and Coherent  Volume:  Normal  Mood:  Anxious and Euthymic  Affect:  Constricted  Thought Process:  Coherent  Orientation:  Full (Time, Place, and Person)  Thought Content:  Logical  Suicidal Thoughts:  No  Homicidal Thoughts:  No  Memory:  Recent;   Fair  Judgement:  Other:  Improving  Insight:  Shallow  Psychomotor Activity:  Normal  Concentration:  Concentration: Good  Recall:  NA  Fund of Knowledge:  Good  Language:  Good  Akathisia:  No    AIMS (if indicated):     Assets:  Communication Skills Desire for Improvement Housing Intimacy Leisure Time Physical Health Resilience Social Support  ADL's:  Intact  Cognition:  WNL  Sleep:  Number of Hours: 6.75     Treatment Plan Summary: Daily contact with patient to assess and evaluate symptoms and progress in treatment  Mrs. Larose Hiresrevost is a 35 yo patient who has a hx of SA, MDD, and anxiety. Patient continues to be guarded on exam, but today patient affirmed that this is her general personality and she realized last night that she should practice being more open. Patient was  started on gabapentin in the last afternoon to help with her anxiety and will continue today. Patient reports that her anxiety interrupted her ability to sleep last night. Will increase patient trazodone. Will reassess patient in the AM as she will require 1 full day on gabapentin and will expect better sleep tonight with changes in her medication. MDD recurrent, severe, w/o psychotic features vs MDD 2/2 medical illness, Substance induced mood disorder vs PTSD Anxiety - Effexor XR 37.5mg  daily - Gabapentin  200mg  TID - Trazodone 100mg   - SW schedule patient f/u  Tobacco use disorder - 21 mg nicotine patch  EtoH use disorder - Ativan Detox Protocol - thiamine 100mg   - MV  PRN -Tylenol 650mg  q6h, pain -Maalox 50ml q4h, indigestion -Atarax 25mg  TID, anxiety -Milk of Mag 43mL, constipation  - Ativan Detox Protocol PGY-1 , MD 02/12/2021, 10:39 AM

## 2021-02-12 NOTE — BHH Group Notes (Signed)
  Type of Therapy and Topic:  Group Therapy - Healthy vs Unhealthy Coping Skills  Participation Level:  Active   Description of Group The focus of this group was to determine what unhealthy coping techniques typically are used by group members and what healthy coping techniques would be helpful in coping with various problems. Patients were guided in becoming aware of the differences between healthy and unhealthy coping techniques. Patients were asked to identify 2-3 healthy coping skills they would like to learn to use more effectively.  Therapeutic Goals 1. Patients learned that coping is what human beings do all day long to deal with various situations in their lives 2. Patients defined and discussed healthy vs unhealthy coping techniques 3. Patients identified their preferred coping techniques and identified whether these were healthy or unhealthy 4. Patients determined 2-3 healthy coping skills they would like to become more familiar with and use more often. 5. Patients provided support and ideas to each other  Therapeutic Modalities Cognitive Behavioral Therapy Motivational Interviewing  Perley Arthurs, LCSWA Clinicial Social Worker [facility] 

## 2021-02-13 MED ORDER — GABAPENTIN 100 MG PO CAPS: 200.0000 mg | ORAL_CAPSULE | Freq: Three times a day (TID) | ORAL | 0 refills | Status: DC

## 2021-02-13 MED ORDER — TRAZODONE HCL 100 MG PO TABS: 100.0000 mg | ORAL_TABLET | Freq: Every day | ORAL | 0 refills | Status: DC

## 2021-02-13 MED ORDER — HYDROXYZINE HCL 25 MG PO TABS: 25.0000 mg | ORAL_TABLET | Freq: Four times a day (QID) | ORAL | 0 refills | Status: DC | PRN

## 2021-02-13 MED ORDER — VENLAFAXINE HCL ER 37.5 MG PO CP24: 37.5000 mg | ORAL_CAPSULE | Freq: Every day | ORAL | 0 refills | Status: DC

## 2021-02-13 NOTE — Progress Notes (Signed)
Pt was educated on prescriptions, follow up care, and when to call for help/suicide hotline. Pt questions were answered and pt verbalized understanding and did not voice any concerns. Pt's belongings were returned and belongings sheet was signed. Pt was given her discharge paperwork and sample medications. At the time of discharge, pt was not observed to be in any distress. Pt was safely discharged to the lobby.

## 2021-02-13 NOTE — Progress Notes (Signed)
  [facility] Adult Case Management Discharge Plan :  Will you be returning to the same living situation after discharge:  Yes,  Home  At discharge, do you have transportation home?: Yes,  Mother  Do you have the ability to pay for your medications: Yes,  Employment and family   Release of information consent forms completed and in the chart;  Patient's signature needed at discharge.  Patient to Follow up at:  Follow-up Information    [facility]. Go on 02/18/2021.   Specialty: Behavioral Health Why: You have a walk in appointment for therapy services on 02/18/21 at 7:45 am   You also have a walk in appointment for medication management services on 02/26/21 at 7:45 am.  Walk in appointments are first come, first served and are held in person. Contact information: [phone] [place] [address] [phone]              Next level of care provider has access to [facility] Link:yes  Safety Planning and Suicide Prevention discussed: Yes,  with mother and patient   Have you used any form of tobacco in the last 30 days? (Cigarettes, Smokeless Tobacco, Cigars, and/or Pipes): Yes  Has patient been referred to the Quitline?: Patient refused referral  Patient has been referred for addiction treatment: Pt. refused referral  Aram Beecham, LCSWA 02/13/2021, 8:52 AM

## 2021-02-13 NOTE — BHH Suicide Risk Assessment (Signed)
[facility] Discharge Suicide Risk Assessment   Principal Problem: MDD (major depressive disorder), recurrent severe, without psychosis (HCC) Discharge Diagnoses: Principal Problem:   MDD (major depressive disorder), recurrent severe, without psychosis (HCC) Active Problems:   Alcohol dependence, binge pattern (HCC)   Total Time spent with patient: 20 minutes  Musculoskeletal: Strength & Muscle Tone: within normal limits Gait & Station: normal Patient leans: N/A  Psychiatric Specialty Exam: Review of Systems  Blood pressure 105/69, pulse 86, temperature 98.3 F (36.8 C), temperature source Oral, resp. rate 18, height 5\' 1"  (1.549 m), weight 56.7 kg, SpO2 99 %.Body mass index is 23.62 kg/m.  General Appearance: Casual  Eye Contact::  Fair  Speech:  Clear and Coherent  Volume:  Normal  Mood:  Euthymic  Affect:  Appropriate  Thought Process:  Coherent  Orientation:  Full (Time, Place, and Person)  Thought Content:  Logical  Suicidal Thoughts:  No  Homicidal Thoughts:  No  Memory:  Recent;   Fair  Judgement:  Fair  Insight:  Fair  Psychomotor Activity:  Normal  Concentration:  Fair  Recall:  [id number] of Knowledge:Fair  Language: Fair  Akathisia:  No  Handed:  Right  AIMS (if indicated):     Assets:  Desire for Improvement Financial Resources/Insurance Leisure Time Physical Health Resilience Talents/Skills  Sleep:  Number of Hours: 3.25  Cognition: WNL  ADL's:  Intact   Mental Status Per Nursing Assessment::   On Admission:  Suicidal ideation indicated by others,Self-harm behaviors  Demographic Factors:  Caucasian  Loss Factors: Loss of significant relationship  Historical Factors: Impulsivity  Risk Reduction Factors:   Sense of responsibility to family, Living with another person, especially a relative, Positive social support, Positive therapeutic relationship and Positive coping skills or problem solving skills  Continued Clinical Symptoms:  Previous  Psychiatric Diagnoses and Treatments  Cognitive Features That Contribute To Risk:  None    Suicide Risk:  Mild:  Suicidal ideation of limited frequency, intensity, duration, and specificity.  There are no identifiable plans, no associated intent, mild dysphoria and related symptoms, good self-control (both objective and subjective assessment), few other risk factors, and identifiable protective factors, including available and accessible social support.   Follow-up Information    [facility]. Go on 02/18/2021.   Specialty: Behavioral Health Why: You have a walk in appointment for therapy services on 02/18/21 at 7:45 am   You also have a walk in appointment for medication management services on 02/26/21 at 7:45 am.  Walk in appointments are first come, first served and are held in person. Contact information: [address] [place] [phone]              Plan Of Care/Follow-up recommendations:  Other:  Follow-up with outpatient care  [phone], MD 02/13/2021, 9:05 AM

## 2021-02-13 NOTE — Progress Notes (Signed)
Recreation Therapy Notes  Date: 2.23.22 Time: 0930 Location: [address]  Group Topic: Stress Management  Goal Area(s) Addresses:  Patient will identify positive stress management techniques. Patient will identify benefits of using stress management post d/c.  Behavioral Response: Engaged  Intervention: Stress Management  Activity:  Meditation.  LRT played a meditation that focused on extending love and kindness to self and others.  Patients were to listen and follow along as meditation played to engage in activity.    Education:  Stress Management, Discharge Planning.   Education Outcome: Acknowledges Education  Clinical Observations/Feedback: Pt attended and participated in group activity.    Caroll Rancher, LRT/CTRS         Caroll Rancher A 02/13/2021 11:03 AM

## 2021-02-13 NOTE — Progress Notes (Signed)
Pt reports feeling anxious about discharge tomorrow. Pt said that one of the first things she will be doing is picking up her medications from the pharmacist. She also said that she plans on seeing her therapist once social work sets someone up for her. She is minimal and guarded upon assessment. She has been social and pleasant when interacting with her peers. She denies any side effects from her medications. She did have to receive a one time dose of 50 mg of trazodone since she was having trouble sleeping at 2338. Pt denies SI/HI and AVH. Active listening, reassurance, and support provided. Medications administered as ordered by provider. Q 15 min safety checks continue. Pt's safety has been maintained.   02/12/21 2103  Psych Admission Type (Psych Patients Only)  Admission Status Voluntary  Psychosocial Assessment  Patient Complaints Anxiety;Depression  Eye Contact Brief  Facial Expression Animated;Anxious  Affect Anxious;Appropriate to circumstance  Speech Logical/coherent  Interaction Minimal;Forwards little  Motor Activity Fidgety  Appearance/Hygiene Unremarkable  Behavior Characteristics Cooperative;Anxious  Mood Depressed;Anxious;Pleasant  Thought Process  Coherency WDL  Content WDL  Delusions None reported or observed  Perception WDL  Hallucination None reported or observed  Judgment Poor  Confusion None  Danger to Self  Current suicidal ideation? Denies  Danger to Others  Danger to Others None reported or observed

## 2021-02-13 NOTE — Progress Notes (Signed)
Spirituality group facilitated by Wilkie Aye, MDiv, BCC.  Group Description:  Group focused on topic of hope.  Patients participated in facilitated discussion around topic, connecting with one another around experiences and definitions for hope.  Group members engaged with visual explorer photos, reflecting on what hope looks like for them today.  Group engaged in discussion around how their definitions of hope are present today in hospital.   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 This gives her hope because it reminds her of new life and how taking care of things day by day is hoe she needs to be in life.   Leane Para  Counseling Intern @ [facility]
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
## 2021-02-26 ENCOUNTER — Encounter (HOSPITAL_COMMUNITY): Payer: Self-pay | Admitting: Psychiatry

## 2021-02-26 ENCOUNTER — Other Ambulatory Visit: Payer: Self-pay

## 2021-02-26 ENCOUNTER — Ambulatory Visit (HOSPITAL_COMMUNITY): Payer: No Payment, Other | Admitting: Psychiatry

## 2021-02-26 VITALS — BP 131/78 | HR 67 | Ht 61.0 in | Wt 123.8 lb

## 2021-02-26 DIAGNOSIS — F332 Major depressive disorder, recurrent severe without psychotic features: Secondary | ICD-10-CM

## 2021-02-26 DIAGNOSIS — F1020 Alcohol dependence, uncomplicated: Secondary | ICD-10-CM

## 2021-02-26 DIAGNOSIS — F411 Generalized anxiety disorder: Secondary | ICD-10-CM

## 2021-02-26 MED ORDER — GABAPENTIN 100 MG PO CAPS: 200.0000 mg | ORAL_CAPSULE | Freq: Three times a day (TID) | ORAL | 2 refills | Status: AC

## 2021-02-26 MED ORDER — HYDROXYZINE HCL 50 MG PO TABS: 50.0000 mg | ORAL_TABLET | Freq: Four times a day (QID) | ORAL | 2 refills | Status: AC | PRN

## 2021-02-26 MED ORDER — TRAZODONE HCL 150 MG PO TABS: 150.0000 mg | ORAL_TABLET | Freq: Every day | ORAL | 2 refills | Status: AC

## 2021-02-26 MED ORDER — VENLAFAXINE HCL ER 75 MG PO CP24: 75.0000 mg | ORAL_CAPSULE | Freq: Every day | ORAL | 2 refills | Status: AC
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
## 2021-05-22 ENCOUNTER — Encounter (HOSPITAL_COMMUNITY): Payer: No Payment, Other | Admitting: Psychiatry

## 2021-09-02 ENCOUNTER — Emergency Department (HOSPITAL_COMMUNITY): Payer: Self-pay

## 2021-09-02 ENCOUNTER — Encounter (HOSPITAL_COMMUNITY): Payer: Self-pay

## 2021-09-02 ENCOUNTER — Emergency Department (HOSPITAL_COMMUNITY)
Admission: EM | Admit: 2021-09-02 | Discharge: 2021-09-03 | Disposition: A | Discharge status: Home / Self Care | Payer: Self-pay | Attending: Emergency Medicine | Admitting: Emergency Medicine

## 2021-09-02 ENCOUNTER — Other Ambulatory Visit: Payer: Self-pay

## 2021-09-02 DIAGNOSIS — N9489 Other specified conditions associated with female genital organs and menstrual cycle: Secondary | ICD-10-CM | POA: Insufficient documentation

## 2021-09-02 DIAGNOSIS — R112 Nausea with vomiting, unspecified: Secondary | ICD-10-CM | POA: Insufficient documentation

## 2021-09-02 DIAGNOSIS — Z5321 Procedure and treatment not carried out due to patient leaving prior to being seen by health care provider: Secondary | ICD-10-CM

## 2021-09-02 DIAGNOSIS — R39198 Other difficulties with micturition: Secondary | ICD-10-CM | POA: Insufficient documentation

## 2021-09-02 DIAGNOSIS — R1010 Upper abdominal pain, unspecified: Secondary | ICD-10-CM | POA: Insufficient documentation

## 2021-09-02 LAB — CBC WITH DIFFERENTIAL/PLATELET
Abs Immature Granulocytes: 0.05 10*3/uL (ref 0.00–0.07)
Basophils Absolute: 0 10*3/uL (ref 0.0–0.1)
Basophils Relative: 0 %
Eosinophils Absolute: 0 10*3/uL (ref 0.0–0.5)
Eosinophils Relative: 0 %
HCT: 46.1 % — ABNORMAL HIGH (ref 36.0–46.0)
Hemoglobin: 15.9 g/dL — ABNORMAL HIGH (ref 12.0–15.0)
Immature Granulocytes: 0 %
Lymphocytes Relative: 16 %
Lymphs Abs: 1.9 10*3/uL (ref 0.7–4.0)
MCH: 33.5 pg (ref 26.0–34.0)
MCHC: 34.5 g/dL (ref 30.0–36.0)
MCV: 97.3 fL (ref 80.0–100.0)
Monocytes Absolute: 0.9 10*3/uL (ref 0.1–1.0)
Monocytes Relative: 8 %
Neutro Abs: 8.9 10*3/uL — ABNORMAL HIGH (ref 1.7–7.7)
Neutrophils Relative %: 76 %
Platelets: 380 10*3/uL (ref 150–400)
RBC: 4.74 MIL/uL (ref 3.87–5.11)
RDW: 12.8 % (ref 11.5–15.5)
WBC: 11.8 10*3/uL — ABNORMAL HIGH (ref 4.0–10.5)
nRBC: 0 % (ref 0.0–0.2)

## 2021-09-02 LAB — COMPREHENSIVE METABOLIC PANEL
ALT: 27 U/L (ref 0–44)
AST: 25 U/L (ref 15–41)
Albumin: 4.9 g/dL (ref 3.5–5.0)
Alkaline Phosphatase: 56 U/L (ref 38–126)
Anion gap: 12 (ref 5–15)
BUN: 16 mg/dL (ref 6–20)
CO2: 28 mmol/L (ref 22–32)
Calcium: 10 mg/dL (ref 8.9–10.3)
Chloride: 101 mmol/L (ref 98–111)
Creatinine, Ser: 0.66 mg/dL (ref 0.44–1.00)
GFR, Estimated: >60 mL/min (ref >60)
Glucose, Bld: 113 mg/dL — ABNORMAL HIGH (ref 70–99)
Potassium: 3.4 mmol/L — ABNORMAL LOW (ref 3.5–5.1)
Sodium: 141 mmol/L (ref 135–145)
Total Bilirubin: 1.3 mg/dL — ABNORMAL HIGH (ref 0.3–1.2)
Total Protein: 8.3 g/dL — ABNORMAL HIGH (ref 6.5–8.1)

## 2021-09-02 LAB — LIPASE, BLOOD: Lipase: 103 U/L — ABNORMAL HIGH (ref 11–51)

## 2021-09-02 LAB — I-STAT BETA HCG BLOOD, ED (MC, WL, AP ONLY): I-stat hCG, quantitative: <5 m[IU]/mL (ref <5)

## 2021-09-02 MED ORDER — ONDANSETRON 4 MG PO TBDP: 4.0000 mg | ORAL_TABLET | Freq: Once | ORAL | Status: DC

## 2021-09-02 NOTE — ED Triage Notes (Signed)
Per EMS- Patient c/o upper abdominal pain, N/V x 3 days.

## 2021-09-02 NOTE — ED Provider Notes (Signed)
Emergency Medicine Provider Triage Evaluation Note  [name] , a 35 y.o. female  was evaluated in triage.  Pt complains of nausea, vomiting, abdominal pain.  Patient states symptoms began 3 days ago.  Pain is mostly in her upper abdomen.  She has been unable to tolerate any p.o.  History of frequent kidney stones, states this presents similarly.  She is having decreased urination.  No fevers or chills.  No normal bowel movements.  Review of Systems  Positive: N/v, abd pain Negative: fever  Physical Exam  BP (!) 147/105 (BP Location: Left Arm)   Pulse 86   Temp 99.9 F (37.7 C) (Oral)   Resp 14   Ht 5\' 1"  (1.549 m)   Wt 59 kg   SpO2 100%   BMI 24.56 kg/m  Gen:   Awake, no distress   Resp:  Normal effort  MSK:   Moves extremities without difficulty  Other:  Tenderness palpation of the upper abdomen.  Medical Decision Making  Medically screening exam initiated at 6:45 PM.  Appropriate orders placed.  [name] was informed that the remainder of the evaluation will be completed by another provider, this initial triage assessment does not replace that evaluation, and the importance of remaining in the ED until their evaluation is complete.  Labs, ua, ct   [place], PA-C 09/02/21 1846    11/02/21, MD 09/02/21 [phone]

## 2021-09-04 ENCOUNTER — Encounter: Payer: Self-pay | Admitting: Emergency Medicine

## 2021-09-04 ENCOUNTER — Other Ambulatory Visit: Payer: Self-pay

## 2021-09-04 ENCOUNTER — Emergency Department
Admission: EM | Admit: 2021-09-04 | Discharge: 2021-09-04 | Disposition: A | Discharge status: Home / Self Care | Payer: Self-pay | Attending: Emergency Medicine | Admitting: Emergency Medicine

## 2021-09-04 DIAGNOSIS — E860 Dehydration: Secondary | ICD-10-CM | POA: Insufficient documentation

## 2021-09-04 DIAGNOSIS — F17210 Nicotine dependence, cigarettes, uncomplicated: Secondary | ICD-10-CM | POA: Insufficient documentation

## 2021-09-04 DIAGNOSIS — R112 Nausea with vomiting, unspecified: Secondary | ICD-10-CM

## 2021-09-04 DIAGNOSIS — K859 Acute pancreatitis without necrosis or infection, unspecified: Secondary | ICD-10-CM | POA: Insufficient documentation

## 2021-09-04 LAB — CBC
HCT: 44.3 % (ref 36.0–46.0)
Hemoglobin: 16.4 g/dL — ABNORMAL HIGH (ref 12.0–15.0)
MCH: 35 pg — ABNORMAL HIGH (ref 26.0–34.0)
MCHC: 37 g/dL — ABNORMAL HIGH (ref 30.0–36.0)
MCV: 94.7 fL (ref 80.0–100.0)
Platelets: 371 10*3/uL (ref 150–400)
RBC: 4.68 MIL/uL (ref 3.87–5.11)
RDW: 12.6 % (ref 11.5–15.5)
WBC: 9.9 10*3/uL (ref 4.0–10.5)
nRBC: 0 % (ref 0.0–0.2)

## 2021-09-04 LAB — COMPREHENSIVE METABOLIC PANEL
ALT: 36 U/L (ref 0–44)
AST: 32 U/L (ref 15–41)
Albumin: 4.7 g/dL (ref 3.5–5.0)
Alkaline Phosphatase: 55 U/L (ref 38–126)
Anion gap: 12 (ref 5–15)
BUN: 16 mg/dL (ref 6–20)
CO2: 30 mmol/L (ref 22–32)
Calcium: 9.9 mg/dL (ref 8.9–10.3)
Chloride: 92 mmol/L — ABNORMAL LOW (ref 98–111)
Creatinine, Ser: 0.57 mg/dL (ref 0.44–1.00)
GFR, Estimated: >60 mL/min (ref >60)
Glucose, Bld: 125 mg/dL — ABNORMAL HIGH (ref 70–99)
Potassium: 3.3 mmol/L — ABNORMAL LOW (ref 3.5–5.1)
Sodium: 134 mmol/L — ABNORMAL LOW (ref 135–145)
Total Bilirubin: 1.5 mg/dL — ABNORMAL HIGH (ref 0.3–1.2)
Total Protein: 7.9 g/dL (ref 6.5–8.1)

## 2021-09-04 LAB — LIPASE, BLOOD: Lipase: 174 U/L — ABNORMAL HIGH (ref 11–51)

## 2021-09-04 MED ORDER — ONDANSETRON HCL 4 MG/2ML IJ SOLN
4.0000 mg | Freq: Once | INTRAMUSCULAR | Status: AC
  Administered 2021-09-04: 4 mg via INTRAVENOUS
  Filled 2021-09-04: qty 2

## 2021-09-04 MED ORDER — ONDANSETRON 4 MG PO TBDP: 4.0000 mg | ORAL_TABLET | Freq: Three times a day (TID) | ORAL | 0 refills | Status: AC | PRN

## 2021-09-04 MED ORDER — LACTATED RINGERS IV BOLUS
1000.0000 mL | Freq: Once | INTRAVENOUS | Status: AC
  Administered 2021-09-04: 1000 mL via INTRAVENOUS

## 2021-09-04 MED ORDER — FAMOTIDINE 40 MG PO TABS: 40.0000 mg | ORAL_TABLET | Freq: Every evening | ORAL | 1 refills | Status: AC

## 2021-09-04 NOTE — ED Provider Notes (Signed)
[name] Emergency Department Provider Note   ____________________________________________   Event Date/Time   First MD Initiated Contact with Patient 09/04/21 0848     (approximate)  I have reviewed the triage vital signs and the nursing notes.   HISTORY  Chief Complaint Abdominal Pain and Emesis    HPI [name] is a 35 y.o. female who presents for nausea, vomiting, and midepigastric abdominal pain  LOCATION: Abdomen, mid epigastric region DURATION: 6 days prior to arrival TIMING: Stable since onset SEVERITY: Severe QUALITY: Burning pain CONTEXT: Patient states that she drank more alcohol than usual prior to Friday when the symptoms began and has been having midepigastric abdominal pain with associated nausea/vomiting since then MODIFYING FACTORS: Any p.o. intake worsens this pain and nausea and she denies any relieving factors ASSOCIATED SYMPTOMS: Nausea, vomiting   Per medical record review patient has history of of anxiety, depression, and acid reflux          Past Medical History:  Diagnosis Date   Acid reflux OCCASIONAL   Anxiety    CIN III (cervical intraepithelial neoplasia III)    Endometriosis    Frequency of urination    nocturia, hematuria, urgency    Headache    History of alcohol abuse AS TEEN   History of kidney stones    ureteral stone right   History of major depression AGE 86-  SUICIDE ATTEMPT   PT STATES NO PROBLEMS SINCE   History of suicide attempt WRIST CUTTINE   PT STATES LAST TIME AGE 86--  NO ATTEMPTS SINCE--  RECEIVED COUNSELING   Lumbar facet arthropathy    OA (osteoarthritis of spine) LUMBAR --  PER PT   UTI (lower urinary tract infection)     Patient Active Problem List   Diagnosis Date Noted   MDD (major depressive disorder), recurrent severe, without psychosis (HCC) 02/10/2021   Hyponatremia 11/12/2019   Leukocytosis 11/12/2019   Hydronephrosis with renal and ureteral calculus  obstruction 11/12/2019   Renal stone 11/12/2019   Allergic rhinitis 02/18/2017   Substance induced mood disorder (HCC) 02/07/2017   Aphthous ulcer 03/20/2016   Elevated liver enzymes 07/02/2015   Unspecified constipation 04/01/2014   Alcohol dependence, binge pattern (HCC) 03/30/2014   Hemorrhoids 04/16/2012   ANEMIA 08/24/2008   Generalized anxiety disorder 08/24/2008   ALCOHOL ABUSE, HX OF 08/24/2008   NEPHROLITHIASIS 08/16/2008   TOBACCO ABUSE 05/19/2008   RENAL CALCULUS, HX OF 03/21/2008    Past Surgical History:  Procedure Laterality Date   CERVICAL BIOPSY  W/ LOOP ELECTRODE EXCISION  FEB  2013   CIN III  (GYN OFFICE)   CYSTOSCOPY W/ URETERAL STENT PLACEMENT Left 11/12/2019   Procedure: CYSTOSCOPY WITH RETROGRADE PYELOGRAM/URETERAL STENT PLACEMENT;  Surgeon: Jerilee Field, MD;  Location: [facility];  Service: Urology;  Laterality: Left;   CYSTOSCOPY WITH URETEROSCOPY  04/20/2012   Procedure: CYSTOSCOPY WITH URETEROSCOPY;  Surgeon: Antony Haste, MD;  Location: [facility];  Service: Urology;  Laterality: N/A;  CYSTO, RIGHT URETEROSCOPY ,  STONE BASKET EXTRACTION   CARM LASER     ORIF RIGHT LITTLE FINGER FX  2006   URETEROSCOPY WITH HOLMIUM LASER LITHOTRIPSY Left 11/29/2019   Procedure: URETEROSCOPY WITH HOLMIUM LASER LITHOTRIPSY/ STENT EXCHANGE;  Surgeon: Jerilee Field, MD;  Location: [facility];  Service: Urology;  Laterality: Left;  ONLY NEEDS 60 MIN   WISDOM TOOTH EXTRACTION  2012   ORAL SURGEON OFFICE    Prior to Admission medications  Medication Sig Start Date End Date Taking? Authorizing Provider  famotidine (PEPCID) 40 MG tablet Take 1 tablet (40 mg total) by mouth every evening. 09/04/21 09/04/22 Yes Luisfelipe Engelstad, Clent Jacks, MD  ondansetron (ZOFRAN ODT) 4 MG disintegrating tablet Take 1 tablet (4 mg total) by mouth every 8 (eight) hours as needed for nausea or vomiting. 09/04/21  Yes Merwyn Katos, MD  gabapentin (NEURONTIN) 100  MG capsule Take 2 capsules (200 mg total) by mouth 3 (three) times daily. agitation 02/26/21   Shanna Cisco, NP  traZODone (DESYREL) 150 MG tablet Take 1 tablet (150 mg total) by mouth at bedtime. 02/26/21   Shanna Cisco, NP  venlafaxine XR (EFFEXOR-XR) 75 MG 24 hr capsule Take 1 capsule (75 mg total) by mouth daily with breakfast. 02/26/21   Shanna Cisco, NP    Allergies Codeine and Bactrim [sulfamethoxazole-trimethoprim]  Family History  Problem Relation Age of Onset   Heart failure Father    Cancer Mother    Hepatitis Other        Autoimmune treated with prednisone    Social History Social History   Tobacco Use   Smoking status: Every Day    Packs/day: 0.50    Years: 8.00    Pack years: 4.00    Types: Cigarettes   Smokeless tobacco: Never  Vaping Use   Vaping Use: Never used  Substance Use Topics   Alcohol use: Yes    Comment: last drank alcohol few months ago   Drug use: Yes    Types: Marijuana    Review of Systems Constitutional: No fever/chills Eyes: No visual changes. ENT: No sore throat. Cardiovascular: Denies chest pain. Respiratory: Denies shortness of breath. Gastrointestinal: Endorses midepigastric abdominal pain, nausea, and vomiting.  No diarrhea. Genitourinary: Negative for dysuria. Musculoskeletal: Negative for acute arthralgias Skin: Negative for rash. Neurological: Negative for headaches, weakness/numbness/paresthesias in any extremity Psychiatric: Negative for suicidal ideation/homicidal ideation   ____________________________________________   PHYSICAL EXAM:  VITAL SIGNS: ED Triage Vitals  Enc Vitals Group     BP 09/04/21 0848 137/85     Pulse Rate 09/04/21 0848 66     Resp 09/04/21 0848 16     Temp 09/04/21 0848 98 F (36.7 C)     Temp Source 09/04/21 0848 Oral     SpO2 09/04/21 0848 99 %     Weight 09/04/21 0846 130 lb 1.1 oz (59 kg)     Height 09/04/21 0846 5\' 1"  (1.549 m)     Head Circumference --      Peak Flow  --      Pain Score 09/04/21 0846 4     Pain Loc --      Pain Edu? --      Excl. in GC? --    Constitutional: Alert and oriented. Well appearing middle-aged Caucasian female in no acute distress. Eyes: Conjunctivae are normal. PERRL. Head: Atraumatic. Nose: No congestion/rhinnorhea. Mouth/Throat: Mucous membranes are moist. Neck: No stridor Cardiovascular: Grossly normal heart sounds.  Good peripheral circulation. Respiratory: Normal respiratory effort.  No retractions. Gastrointestinal: Soft and nontender. No distention. Musculoskeletal: No obvious deformities Neurologic:  Normal speech and language. No gross focal neurologic deficits are appreciated. Skin:  Skin is warm and dry. No rash noted. Psychiatric: Mood and affect are normal. Speech and behavior are normal.  ____________________________________________   LABS (all labs ordered are listed, but only abnormal results are displayed)  Labs Reviewed  LIPASE, BLOOD - Abnormal; Notable for the following components:  Result Value   Lipase 174 (*)    All other components within normal limits  COMPREHENSIVE METABOLIC PANEL - Abnormal; Notable for the following components:   Sodium 134 (*)    Potassium 3.3 (*)    Chloride 92 (*)    Glucose, Bld 125 (*)    Total Bilirubin 1.5 (*)    All other components within normal limits  CBC - Abnormal; Notable for the following components:   Hemoglobin 16.4 (*)    MCH 35.0 (*)    MCHC 37.0 (*)    All other components within normal limits  URINALYSIS, COMPLETE (UACMP) WITH MICROSCOPIC  POC URINE PREG, ED   ____________________________________________  EKG  ED ECG REPORT I, Merwyn Katos, the attending physician, personally viewed and interpreted this ECG.  Date: 09/04/2021 EKG Time: 0851 Rate: 64 Rhythm: normal sinus rhythm QRS Axis: normal Intervals: normal ST/T Wave abnormalities: normal Narrative Interpretation: no evidence of acute  ischemia  ____________________________________________  RADIOLOGY  ED MD interpretation: CT of the abdomen pelvis without contrast from [facility] does not show any evidence of acute pancreatitis, kidney stones, or any other evidence of acute abnormalities  Official radiology report(s): No results found.  ____________________________________________   PROCEDURES  Procedure(s) performed (including Critical Care):  Procedures   ____________________________________________   INITIAL IMPRESSION / ASSESSMENT AND PLAN / ED COURSE  As part of my medical decision making, I reviewed the following data within the electronic medical record, if available:  Nursing notes reviewed and incorporated, Labs reviewed, EKG interpreted, Old chart reviewed, Radiograph reviewed and Notes from prior ED visits reviewed and incorporated        Given history and exam I have a low suspicion for AAA, SBO, appendicitis, mesenteric ischemia, nephrolithiasis, pyelonephritis, or diverticulitis. I have a moderate concern for pancreatitis, gastritis vs non-bleeding peptic ulcer, or hepatobiliary disease and thus will obtain labs and imaging.  ED Workup: CBC, BMP, LFTs, Lipase, LDH  No signs of severe pancreatitis on exam such as ecchymosis of periumbilical region or flanks, hypoxia, or tachypneia.  After treatment and hydration, patient is p.o. tolerant and pain is well controlled. Rx: Zofran, Pepcid  Disposition: Discharge home with home care and strict return precautions      ____________________________________________   FINAL CLINICAL IMPRESSION(S) / ED DIAGNOSES  Final diagnoses:  Non-intractable vomiting with nausea, unspecified vomiting type  Dehydration  Acute pancreatitis without infection or necrosis, unspecified pancreatitis type     ED Discharge Orders          Ordered    ondansetron (ZOFRAN ODT) 4 MG disintegrating tablet  Every 8 hours PRN        09/04/21 1412    famotidine  (PEPCID) 40 MG tablet  Every evening        09/04/21 1412             Note:  This document was prepared using Dragon voice recognition software and may include unintentional dictation errors.    Merwyn Katos, MD 09/04/21 1415

## 2021-09-04 NOTE — ED Notes (Signed)
Patient unable to give urine sample at this time. Patient given ice chips. Support person at bedside. No needs expressed at this time.

## 2021-09-04 NOTE — ED Triage Notes (Signed)
Pt comes into the ED via POV c/o upper abd pain and emesis.  Pt seen at [facility] and completed CT and blood work but never was seen by physician due to wait times.  Pt in NAD at this time.

## 2022-01-09 ENCOUNTER — Other Ambulatory Visit: Payer: Self-pay

## 2022-01-09 ENCOUNTER — Emergency Department
Admission: EM | Admit: 2022-01-09 | Discharge: 2022-01-09 | Disposition: A | Discharge status: Home / Self Care | Payer: Self-pay | Attending: Emergency Medicine | Admitting: Emergency Medicine

## 2022-01-09 DIAGNOSIS — F10920 Alcohol use, unspecified with intoxication, uncomplicated: Secondary | ICD-10-CM

## 2022-01-09 DIAGNOSIS — Y908 Blood alcohol level of 240 mg/100 ml or more: Secondary | ICD-10-CM | POA: Insufficient documentation

## 2022-01-09 DIAGNOSIS — F10120 Alcohol abuse with intoxication, uncomplicated: Secondary | ICD-10-CM | POA: Insufficient documentation

## 2022-01-09 DIAGNOSIS — Z79899 Other long term (current) drug therapy: Secondary | ICD-10-CM | POA: Insufficient documentation

## 2022-01-09 DIAGNOSIS — N9489 Other specified conditions associated with female genital organs and menstrual cycle: Secondary | ICD-10-CM | POA: Insufficient documentation

## 2022-01-09 LAB — CBC
HCT: 45.2 % (ref 36.0–46.0)
Hemoglobin: 14.5 g/dL (ref 12.0–15.0)
MCH: 33.3 pg (ref 26.0–34.0)
MCHC: 32.1 g/dL (ref 30.0–36.0)
MCV: 103.9 fL — ABNORMAL HIGH (ref 80.0–100.0)
Platelets: 330 10*3/uL (ref 150–400)
RBC: 4.35 MIL/uL (ref 3.87–5.11)
RDW: 12.8 % (ref 11.5–15.5)
WBC: 9.1 10*3/uL (ref 4.0–10.5)
nRBC: 0 % (ref 0.0–0.2)

## 2022-01-09 LAB — COMPREHENSIVE METABOLIC PANEL
ALT: 22 U/L (ref 0–44)
AST: 36 U/L (ref 15–41)
Albumin: 4.5 g/dL (ref 3.5–5.0)
Alkaline Phosphatase: 50 U/L (ref 38–126)
Anion gap: 7 (ref 5–15)
BUN: <5 mg/dL — ABNORMAL LOW (ref 6–20)
CO2: 18 mmol/L — ABNORMAL LOW (ref 22–32)
Calcium: 8.6 mg/dL — ABNORMAL LOW (ref 8.9–10.3)
Chloride: 115 mmol/L — ABNORMAL HIGH (ref 98–111)
Creatinine, Ser: 0.57 mg/dL (ref 0.44–1.00)
GFR, Estimated: >60 mL/min (ref >60)
Glucose, Bld: 108 mg/dL — ABNORMAL HIGH (ref 70–99)
Potassium: 4.7 mmol/L (ref 3.5–5.1)
Sodium: 140 mmol/L (ref 135–145)
Total Bilirubin: 1.2 mg/dL (ref 0.3–1.2)
Total Protein: 7.9 g/dL (ref 6.5–8.1)

## 2022-01-09 LAB — URINE DRUG SCREEN, QUALITATIVE (ARMC ONLY)
Amphetamines, Ur Screen: NOT DETECTED
Barbiturates, Ur Screen: NOT DETECTED
Benzodiazepine, Ur Scrn: NOT DETECTED
Cannabinoid 50 Ng, Ur ~~LOC~~: POSITIVE — AB
Cocaine Metabolite,Ur ~~LOC~~: NOT DETECTED
MDMA (Ecstasy)Ur Screen: NOT DETECTED
Methadone Scn, Ur: NOT DETECTED
Opiate, Ur Screen: NOT DETECTED
Phencyclidine (PCP) Ur S: NOT DETECTED
Tricyclic, Ur Screen: NOT DETECTED

## 2022-01-09 LAB — HCG, QUANTITATIVE, PREGNANCY: hCG, Beta Chain, Quant, S: <1 m[IU]/mL (ref <5)

## 2022-01-09 LAB — ETHANOL: Alcohol, Ethyl (B): 352 mg/dL (ref <10)

## 2022-01-09 LAB — SALICYLATE LEVEL: Salicylate Lvl: <7 mg/dL — ABNORMAL LOW (ref 7.0–30.0)

## 2022-01-09 LAB — ACETAMINOPHEN LEVEL: Acetaminophen (Tylenol), Serum: <10 ug/mL — ABNORMAL LOW (ref 10–30)

## 2022-01-09 LAB — POC URINE PREG, ED: Preg Test, Ur: NEGATIVE

## 2022-01-09 MED ORDER — ONDANSETRON HCL 4 MG/2ML IJ SOLN
4.0000 mg | Freq: Once | INTRAMUSCULAR | Status: AC
  Administered 2022-01-09: 4 mg via INTRAVENOUS
  Filled 2022-01-09: qty 2

## 2022-01-09 MED ORDER — SODIUM CHLORIDE 0.9 % IV BOLUS
1000.0000 mL | Freq: Once | INTRAVENOUS | Status: AC
  Administered 2022-01-09: 1000 mL via INTRAVENOUS

## 2022-01-09 NOTE — ED Notes (Signed)
First nurse-pt brought in via ems from [employer].  Pt was found in the bathroom, passed out, urinary incontinence, emesis.   Pt drank etoh while in the grocery store.  Fsbs 112 per ems.  Bp-108/65, hr-84, sats 98% per ems.    pt in wheelchair in lobby.

## 2022-01-09 NOTE — ED Provider Notes (Addendum)
[facility] Provider Note    Event Date/Time   First MD Initiated Contact with Patient 01/09/22 1703     (approximate)   History   Drug Overdose   HPI  [name] is a 36 y.o. female with a past history of alcohol abuse who is brought to the ED after being found unconscious on the floor at a grocery store.  The patient had vomited.  She required sternal rubbing to arouse her.  Patient denies any recollection of the events, does not remember going to the grocery.  Denies any recent alcohol or drug use.  Denies any significant trauma, denies pain or fever or headache.  Periods are regular.     Physical Exam   Triage Vital Signs: ED Triage Vitals  Enc Vitals Group     BP 01/09/22 1649 104/88     Pulse Rate 01/09/22 1649 88     Resp 01/09/22 1649 12     Temp 01/09/22 1649 98.1 F (36.7 C)     Temp src --      SpO2 01/09/22 1649 98 %     Weight 01/09/22 1650 120 lb (54.4 kg)     Height 01/09/22 1650 5\' 1"  (1.549 m)     Head Circumference --      Peak Flow --      Pain Score --      Pain Loc --      Pain Edu? --      Excl. in GC? --     Most recent vital signs: Vitals:   01/09/22 1823 01/09/22 1930  BP: 112/72 117/68  Pulse: 75 77  Resp: 16 18  Temp:    SpO2: 98% 98%     General: Awake, no distress.  Slowly responsive, slightly slurred speech. CV:  Good peripheral perfusion.  Regular rate and rhythm Resp:  Normal effort.  Clear to auscultation bilaterally Abd:  No distention.  Soft, nontender, nongravid Other:  No edema or rash.  Moist oral mucosa   ED Results / Procedures / Treatments   Labs (all labs ordered are listed, but only abnormal results are displayed) Labs Reviewed  COMPREHENSIVE METABOLIC PANEL - Abnormal; Notable for the following components:      Result Value   Chloride 115 (*)    CO2 18 (*)    Glucose, Bld 108 (*)    BUN <5 (*)    Calcium 8.6 (*)    All other components within normal limits  ETHANOL -  Abnormal; Notable for the following components:   Alcohol, Ethyl (B) 352 (*)    All other components within normal limits  SALICYLATE LEVEL - Abnormal; Notable for the following components:   Salicylate Lvl <7.0 (*)    All other components within normal limits  ACETAMINOPHEN LEVEL - Abnormal; Notable for the following components:   Acetaminophen (Tylenol), Serum <10 (*)    All other components within normal limits  CBC - Abnormal; Notable for the following components:   MCV 103.9 (*)    All other components within normal limits  URINE DRUG SCREEN, QUALITATIVE ([facility] ONLY) - Abnormal; Notable for the following components:   Cannabinoid 50 Ng, Ur [place] POSITIVE (*)    All other components within normal limits  HCG, QUANTITATIVE, PREGNANCY  POC URINE PREG, ED     EKG     RADIOLOGY     PROCEDURES:  Critical Care performed: No  Procedures   MEDICATIONS ORDERED IN ED: Medications  ondansetron [facility]) injection 4 mg (4 mg Intravenous Given 01/09/22 1741)  sodium chloride 0.9 % bolus 1,000 mL (0 mLs Intravenous Stopped 01/09/22 1841)     IMPRESSION / MDM / ASSESSMENT AND PLAN / ED COURSE  I reviewed the triage vital signs and the nursing notes.                              Differential diagnosis includes, but is not limited to, electrolyte abnormality, seizure, alcohol intoxication, drug intoxication     Patient presents with syncope/altered mental status with emesis in a grocery.  Most likely intoxication, will check labs and give IV fluids and antiemetics.  If labs do not confirm alcohol or drug use, will need to obtain CT scan of the head.  Clinical Course as of 01/09/22 2032  Thu Jan 09, 2022  1734 Alcohol, Ethyl (B)(!!): 352 [PS]  1734 Alcohol level 350, which [PS]  1735  explains her presentation as intoxication.  Will observe until sober  [PS]  2031 Ambulatory with steady gait, clear speech.  Tolerating p.o.  Stable for discharge. [PS]    Clinical Course User  Index [PS] Sharman Cheek, MD     FINAL CLINICAL IMPRESSION(S) / ED DIAGNOSES   Final diagnoses:  Alcoholic intoxication without complication (HCC)     Rx / DC Orders   ED Discharge Orders     None        Note:  This document was prepared using Dragon voice recognition software and may include unintentional dictation errors.   Sharman Cheek, MD 01/09/22 1830    Sharman Cheek, MD 01/09/22 2032

## 2022-01-09 NOTE — ED Notes (Signed)
Pt pulled out IV and was trying to get out of bed. Pt sister called and said that pt was at work and left went to [employer] and was  caught stealing she went to the bathroom and that's where they found her passed out. Sister also said that her brother just recently died. Boyfriend is at bedside.

## 2022-01-09 NOTE — ED Notes (Signed)
Pts family contacted about provided a safe ride for the pt home. Pt provided scrubs.

## 2022-01-09 NOTE — ED Notes (Signed)
Pts fiance present at the bedside for safe discharge of the patient. All belongings returned to the patient. D/C info signed by fiance - verbal consent obtained by the patient.

## 2022-01-09 NOTE — ED Triage Notes (Signed)
Pt to ED [facility] from [employer]. Found on floor with vomit next to her. Pt sternal rubbed, minimal response. Pt with even RR. Not answering questions.  Pt with urine on self

## 2023-11-12 ENCOUNTER — Ambulatory Visit (INDEPENDENT_AMBULATORY_CARE_PROVIDER_SITE_OTHER): Payer: Self-pay | Admitting: Obstetrics and Gynecology

## 2023-11-12 ENCOUNTER — Other Ambulatory Visit (HOSPITAL_COMMUNITY)
Admission: RE | Admit: 2023-11-12 | Discharge: 2023-11-12 | Disposition: A | Discharge status: Home / Self Care | Payer: Self-pay | Source: Ambulatory Visit | Attending: Obstetrics and Gynecology | Admitting: Obstetrics and Gynecology

## 2023-11-12 ENCOUNTER — Encounter: Payer: Self-pay | Admitting: Obstetrics and Gynecology

## 2023-11-12 VITALS — BP 111/73 | HR 77 | Ht 62.0 in | Wt 125.0 lb

## 2023-11-12 DIAGNOSIS — Z124 Encounter for screening for malignant neoplasm of cervix: Secondary | ICD-10-CM | POA: Insufficient documentation

## 2023-11-12 DIAGNOSIS — Z01419 Encounter for gynecological examination (general) (routine) without abnormal findings: Secondary | ICD-10-CM | POA: Insufficient documentation

## 2023-11-12 DIAGNOSIS — Z1339 Encounter for screening examination for other mental health and behavioral disorders: Secondary | ICD-10-CM

## 2023-11-12 NOTE — Progress Notes (Signed)
Patient presents for Annual.  Pt does not want any students in exam room.  Hx of LEEP   LMP: 11/03/23 Last pap:  >72yrs  Contraception: None Mammogram: Not yet indicated NO Family Hx of Breast Cancer STD Screening: Declines   CC: Annual/None

## 2023-11-12 NOTE — Progress Notes (Signed)
Obstetrics and Gynecology New Patient Evaluation  Appointment Date: 11/12/2023  OBGYN Clinic: [facility] for [facility]  Chief Complaint:  Chief Complaint  Patient presents with   Gynecologic Exam  Pap smear  History of Present Illness: [name] is a 37 y.o.  G0P0000 (Patient's last menstrual period was 11/03/2023 (exact date).), seen for the above chief complaint. Her past medical history is significant for h/o LEEP   No questions or concerns for today.   Review of Systems: Pertinent items are noted in HPI.    Patient Active Problem List   Diagnosis Date Noted   MDD (major depressive disorder), recurrent severe, without psychosis (HCC) 02/10/2021   Hyponatremia 11/12/2019   Leukocytosis 11/12/2019   Hydronephrosis with renal and ureteral calculus obstruction 11/12/2019   Renal stone 11/12/2019   Allergic rhinitis 02/18/2017   Substance induced mood disorder (HCC) 02/07/2017   Aphthous ulcer 03/20/2016   Elevated liver enzymes 07/02/2015   Constipation 04/01/2014   Alcohol dependence, binge pattern (HCC) 03/30/2014   Hemorrhoids 04/16/2012   ANEMIA 08/24/2008   Generalized anxiety disorder 08/24/2008   ALCOHOL ABUSE, HX OF 08/24/2008   NEPHROLITHIASIS 08/16/2008   TOBACCO ABUSE 05/19/2008   RENAL CALCULUS, HX OF 03/21/2008   Past Medical History:  Past Medical History:  Diagnosis Date   Acid reflux OCCASIONAL   Anxiety    CIN III (cervical intraepithelial neoplasia III)    Endometriosis    Frequency of urination    nocturia, hematuria, urgency    Headache    History of alcohol abuse AS TEEN   History of kidney stones    ureteral stone right   History of major depression AGE 63-  SUICIDE ATTEMPT   PT STATES NO PROBLEMS SINCE   History of suicide attempt WRIST CUTTINE   PT STATES LAST TIME AGE 63--  NO ATTEMPTS SINCE--  RECEIVED COUNSELING   Lumbar facet arthropathy    OA (osteoarthritis of spine) LUMBAR --  PER PT   UTI (lower  urinary tract infection)     Past Surgical History:  Past Surgical History:  Procedure Laterality Date   CERVICAL BIOPSY  W/ LOOP ELECTRODE EXCISION  FEB  2013   CIN III  (GYN OFFICE)   CYSTOSCOPY W/ URETERAL STENT PLACEMENT Left 11/12/2019   Procedure: CYSTOSCOPY WITH RETROGRADE PYELOGRAM/URETERAL STENT PLACEMENT;  Surgeon: Jerilee Field, MD;  Location: [facility];  Service: Urology;  Laterality: Left;   CYSTOSCOPY WITH URETEROSCOPY  04/20/2012   Procedure: CYSTOSCOPY WITH URETEROSCOPY;  Surgeon: Antony Haste, MD;  Location: [facility];  Service: Urology;  Laterality: N/A;  CYSTO, RIGHT URETEROSCOPY ,  STONE BASKET EXTRACTION   CARM LASER     ORIF RIGHT LITTLE FINGER FX  2006   URETEROSCOPY WITH HOLMIUM LASER LITHOTRIPSY Left 11/29/2019   Procedure: URETEROSCOPY WITH HOLMIUM LASER LITHOTRIPSY/ STENT EXCHANGE;  Surgeon: Jerilee Field, MD;  Location: [facility];  Service: Urology;  Laterality: Left;  ONLY NEEDS 60 MIN   WISDOM TOOTH EXTRACTION  2012   ORAL SURGEON OFFICE    Past Obstetrical History:  OB History  Gravida Para Term Preterm AB Living  0 0 0 0 0 0  SAB IAB Ectopic Multiple Live Births  0 0 0 0     Past Gynecological History: As per HPI. Periods: qmonth, regular, 5 days, not heavy or painful.  History of Pap Smear(s): unsure but sometime after last pap; patient believes were.  Social History:  Social History   Socioeconomic  History   Marital status: Single    Spouse name: Not on file   Number of children: Not on file   Years of education: Not on file   Highest education level: Not on file  Occupational History   Occupation: Hair Dresser  Tobacco Use   Smoking status: Some Days    Current packs/day: 0.50    Average packs/day: 0.5 packs/day for 8.0 years (4.0 ttl pk-yrs)    Types: Cigarettes   Smokeless tobacco: Never  Vaping Use   Vaping status: Never Used  Substance and Sexual Activity   Alcohol use: Not  Currently   Drug use: Yes    Types: Marijuana   Sexual activity: Yes    Partners: Male    Birth control/protection: None  Other Topics Concern   Not on file  Social History Narrative   Not on file   Social Determinants of Health   Financial Resource Strain: Not on file  Food Insecurity: Not on file  Transportation Needs: Not on file  Physical Activity: Not on file  Stress: Not on file  Social Connections: Not on file  Intimate Partner Violence: Not on file   In same relationship for past 19 years  Family History:  Family History  Problem Relation Age of Onset   Heart failure Father    Cancer Mother    Hepatitis Other        Autoimmune treated with prednisone   Medications Doyle R. Hast had no medications administered during this visit. Current Outpatient Medications  Medication Sig Dispense Refill   famotidine (PEPCID) 40 MG tablet Take 1 tablet (40 mg total) by mouth every evening. (Patient not taking: Reported on 01/09/2022) 30 tablet 1   gabapentin (NEURONTIN) 100 MG capsule Take 2 capsules (200 mg total) by mouth 3 (three) times daily. agitation (Patient not taking: Reported on 01/09/2022) 90 capsule 2   ondansetron (ZOFRAN ODT) 4 MG disintegrating tablet Take 1 tablet (4 mg total) by mouth every 8 (eight) hours as needed for nausea or vomiting. (Patient not taking: Reported on 01/09/2022) 20 tablet 0   traZODone (DESYREL) 150 MG tablet Take 1 tablet (150 mg total) by mouth at bedtime. (Patient not taking: Reported on 01/09/2022) 30 tablet 2   venlafaxine XR (EFFEXOR-XR) 75 MG 24 hr capsule Take 1 capsule (75 mg total) by mouth daily with breakfast. (Patient not taking: Reported on 01/09/2022) 30 capsule 2   No current facility-administered medications for this visit.    Allergies Codeine and Bactrim [sulfamethoxazole-trimethoprim]  Physical Exam:  BP 111/73   Pulse 77   Ht 5\' 2"  (1.575 m)   Wt 125 lb (56.7 kg)   LMP 11/03/2023 (Exact Date)   BMI 22.86 kg/m   Body mass index is 22.86 kg/m.  General appearance: Well nourished, well developed female in no acute distress.  Neck:  Supple, normal appearance, and no thyromegaly  Respiratory: Normal respiratory effort Abdomen: positive bowel sounds and no masses, hernias; diffusely non tender to palpation, non distended Breasts: breasts appear normal, no suspicious masses, no skin or nipple changes or axillary nodes, and normal palpation . Neuro/Psych:  Normal mood and affect.  Skin:  Warm and dry.  Lymphatic:  No inguinal lymphadenopathy.   Cervical exam performed in the presence of a chaperone Pelvic exam: is not limited by body habitus EGBUS: within normal limits Vagina: within normal limits and with no blood or discharge in the vault Cervix: normal appearing cervix without tenderness, discharge or lesions. Uterus:  nonenlarged  and non tender Adnexa:  normal adnexa and no mass, fullness, tenderness Rectovaginal: deferred  Laboratory: none  Radiology: none  Assessment: patient doing well  Plan:  1. Well woman exam with routine gynecological exam Routine care. Declines anything for birth control.  - Cytology - PAP  2. Cervical cancer screening - Cytology - PAP  RTC PRN  Cornelia Copa MD Attending Center for [facility])

## 2023-11-17 LAB — CYTOLOGY - PAP
Comment: NEGATIVE
Diagnosis: NEGATIVE
High risk HPV: NEGATIVE

## 2023-11-23 ENCOUNTER — Telehealth: Payer: Self-pay | Admitting: *Deleted

## 2023-11-23 NOTE — Telephone Encounter (Signed)
-----   Message from [place] sent at 11/20/2023 11:22 AM EST ----- Patient unsure if mychart works. Can you call her and let her know that her pap and hpv are all normal and she just needs another routine pap smear in 3 years. Thanks

## 2023-11-23 NOTE — Telephone Encounter (Signed)
Pt informed of pap results and recommendation for another pap  in 3 years
